# Patient Record
Sex: Female | Born: 1950 | Race: Black or African American | Hispanic: No | Smoking: Never smoker
Health system: Southern US, Community
[De-identification: ages and names within clinical notes are randomized; demographics above are authoritative.]

## PROBLEM LIST (undated history)

## (undated) DIAGNOSIS — I10 Essential (primary) hypertension: Secondary | ICD-10-CM

## (undated) DIAGNOSIS — R7303 Prediabetes: Secondary | ICD-10-CM

## (undated) DIAGNOSIS — E785 Hyperlipidemia, unspecified: Secondary | ICD-10-CM

## (undated) DIAGNOSIS — E559 Vitamin D deficiency, unspecified: Secondary | ICD-10-CM

## (undated) HISTORY — PX: ABDOMINAL HYSTERECTOMY: SHX81

## (undated) HISTORY — DX: Prediabetes: R73.03

## (undated) HISTORY — DX: Vitamin D deficiency, unspecified: E55.9

## (undated) HISTORY — DX: Hyperlipidemia, unspecified: E78.5

## (undated) HISTORY — DX: Essential (primary) hypertension: I10

---

## 1998-11-03 ENCOUNTER — Encounter: Payer: Self-pay | Admitting: Internal Medicine

## 1998-11-03 ENCOUNTER — Ambulatory Visit (HOSPITAL_COMMUNITY): Admission: RE | Admit: 1998-11-03 | Discharge: 1998-11-03 | Payer: Self-pay | Admitting: Internal Medicine

## 1999-11-24 ENCOUNTER — Encounter: Payer: Self-pay | Admitting: Internal Medicine

## 1999-11-24 ENCOUNTER — Ambulatory Visit (HOSPITAL_COMMUNITY): Admission: RE | Admit: 1999-11-24 | Discharge: 1999-11-24 | Payer: Self-pay | Admitting: Internal Medicine

## 2000-02-03 ENCOUNTER — Emergency Department (HOSPITAL_COMMUNITY): Admission: EM | Admit: 2000-02-03 | Discharge: 2000-02-03 | Payer: Self-pay | Admitting: *Deleted

## 2000-11-25 ENCOUNTER — Ambulatory Visit (HOSPITAL_COMMUNITY): Admission: RE | Admit: 2000-11-25 | Discharge: 2000-11-25 | Payer: Self-pay | Admitting: Internal Medicine

## 2000-11-25 ENCOUNTER — Encounter: Payer: Self-pay | Admitting: Internal Medicine

## 2001-09-08 ENCOUNTER — Encounter: Payer: Self-pay | Admitting: Internal Medicine

## 2001-09-08 ENCOUNTER — Encounter: Admission: RE | Admit: 2001-09-08 | Discharge: 2001-09-08 | Payer: Self-pay | Admitting: Internal Medicine

## 2001-09-11 ENCOUNTER — Inpatient Hospital Stay (HOSPITAL_COMMUNITY): Admission: EM | Admit: 2001-09-11 | Discharge: 2001-09-20 | Payer: Self-pay | Admitting: Emergency Medicine

## 2001-09-11 ENCOUNTER — Encounter (INDEPENDENT_AMBULATORY_CARE_PROVIDER_SITE_OTHER): Payer: Self-pay

## 2001-09-11 ENCOUNTER — Encounter: Payer: Self-pay | Admitting: Internal Medicine

## 2001-09-12 ENCOUNTER — Encounter: Payer: Self-pay | Admitting: Internal Medicine

## 2001-10-24 ENCOUNTER — Other Ambulatory Visit: Admission: RE | Admit: 2001-10-24 | Discharge: 2001-10-24 | Payer: Self-pay | Admitting: Internal Medicine

## 2001-11-27 ENCOUNTER — Encounter: Payer: Self-pay | Admitting: Internal Medicine

## 2001-11-27 ENCOUNTER — Ambulatory Visit (HOSPITAL_COMMUNITY): Admission: RE | Admit: 2001-11-27 | Discharge: 2001-11-27 | Payer: Self-pay | Admitting: Internal Medicine

## 2002-11-28 ENCOUNTER — Ambulatory Visit (HOSPITAL_COMMUNITY): Admission: RE | Admit: 2002-11-28 | Discharge: 2002-11-28 | Payer: Self-pay | Admitting: Internal Medicine

## 2002-11-28 ENCOUNTER — Encounter: Payer: Self-pay | Admitting: Internal Medicine

## 2003-12-05 ENCOUNTER — Ambulatory Visit (HOSPITAL_COMMUNITY): Admission: RE | Admit: 2003-12-05 | Discharge: 2003-12-05 | Payer: Self-pay | Admitting: Internal Medicine

## 2004-12-18 ENCOUNTER — Ambulatory Visit (HOSPITAL_COMMUNITY): Admission: RE | Admit: 2004-12-18 | Discharge: 2004-12-18 | Payer: Self-pay | Admitting: Internal Medicine

## 2005-01-26 ENCOUNTER — Ambulatory Visit: Payer: Self-pay | Admitting: Internal Medicine

## 2005-04-30 ENCOUNTER — Ambulatory Visit: Payer: Self-pay | Admitting: Internal Medicine

## 2005-08-30 ENCOUNTER — Ambulatory Visit: Payer: Self-pay | Admitting: Internal Medicine

## 2005-09-06 ENCOUNTER — Ambulatory Visit: Payer: Self-pay | Admitting: Internal Medicine

## 2005-12-03 ENCOUNTER — Emergency Department (HOSPITAL_COMMUNITY): Admission: EM | Admit: 2005-12-03 | Discharge: 2005-12-03 | Payer: Self-pay | Admitting: Emergency Medicine

## 2005-12-06 ENCOUNTER — Ambulatory Visit: Payer: Self-pay | Admitting: Internal Medicine

## 2005-12-13 ENCOUNTER — Ambulatory Visit: Payer: Self-pay | Admitting: Internal Medicine

## 2006-01-06 ENCOUNTER — Ambulatory Visit (HOSPITAL_COMMUNITY): Admission: RE | Admit: 2006-01-06 | Discharge: 2006-01-06 | Payer: Self-pay | Admitting: Internal Medicine

## 2006-01-26 ENCOUNTER — Encounter: Admission: RE | Admit: 2006-01-26 | Discharge: 2006-01-26 | Payer: Self-pay | Admitting: Internal Medicine

## 2006-01-26 ENCOUNTER — Ambulatory Visit: Payer: Self-pay | Admitting: Internal Medicine

## 2006-12-16 ENCOUNTER — Ambulatory Visit: Payer: Self-pay | Admitting: Internal Medicine

## 2006-12-16 LAB — CONVERTED CEMR LAB
ALT: 20 units/L (ref 0–40)
AST: 25 units/L (ref 0–37)
Albumin: 4 g/dL (ref 3.5–5.2)
Alkaline Phosphatase: 61 units/L (ref 39–117)
BUN: 5 mg/dL — ABNORMAL LOW (ref 6–23)
Bilirubin, Direct: 0.1 mg/dL (ref 0.0–0.3)
CO2: 30 meq/L (ref 19–32)
Calcium: 9.6 mg/dL (ref 8.4–10.5)
Chloride: 108 meq/L (ref 96–112)
Cholesterol: 189 mg/dL (ref 0–200)
Creatinine, Ser: 1 mg/dL (ref 0.4–1.2)
GFR calc Af Amer: 74 mL/min
GFR calc non Af Amer: 61 mL/min
Glucose, Bld: 100 mg/dL — ABNORMAL HIGH (ref 70–99)
HDL: 34.6 mg/dL — ABNORMAL LOW (ref >39.0)
LDL Cholesterol: 142 mg/dL — ABNORMAL HIGH (ref 0–99)
Potassium: 5.2 meq/L — ABNORMAL HIGH (ref 3.5–5.1)
Sodium: 143 meq/L (ref 135–145)
Total Bilirubin: 0.8 mg/dL (ref 0.3–1.2)
Total CHOL/HDL Ratio: 5.5
Total Protein: 7 g/dL (ref 6.0–8.3)
Triglycerides: 63 mg/dL (ref 0–149)
VLDL: 13 mg/dL (ref 0–40)

## 2007-01-10 ENCOUNTER — Ambulatory Visit (HOSPITAL_COMMUNITY): Admission: RE | Admit: 2007-01-10 | Discharge: 2007-01-10 | Payer: Self-pay | Admitting: Internal Medicine

## 2007-07-11 DIAGNOSIS — Z8719 Personal history of other diseases of the digestive system: Secondary | ICD-10-CM

## 2007-07-12 ENCOUNTER — Ambulatory Visit: Payer: Self-pay | Admitting: Internal Medicine

## 2007-07-12 DIAGNOSIS — N040 Nephrotic syndrome with minor glomerular abnormality: Secondary | ICD-10-CM

## 2007-07-12 HISTORY — DX: Nephrotic syndrome with minor glomerular abnormality: N04.0

## 2007-07-12 LAB — CONVERTED CEMR LAB
Bilirubin Urine: NEGATIVE
Blood in Urine, dipstick: NEGATIVE
Glucose, Urine, Semiquant: NEGATIVE
Ketones, urine, test strip: NEGATIVE
Nitrite: NEGATIVE
Protein, U semiquant: NEGATIVE
Specific Gravity, Urine: 1.015
Urobilinogen, UA: NEGATIVE
pH: 6

## 2007-07-13 LAB — CONVERTED CEMR LAB
ALT: 17 units/L (ref 0–35)
AST: 20 units/L (ref 0–37)
Albumin: 4 g/dL (ref 3.5–5.2)
Alkaline Phosphatase: 65 units/L (ref 39–117)
BUN: 9 mg/dL (ref 6–23)
Basophils Absolute: 0 10*3/uL (ref 0.0–0.1)
Basophils Relative: 0.3 % (ref 0.0–1.0)
Bilirubin, Direct: 0.1 mg/dL (ref 0.0–0.3)
CO2: 31 meq/L (ref 19–32)
Calcium: 9.5 mg/dL (ref 8.4–10.5)
Chloride: 109 meq/L (ref 96–112)
Creatinine, Ser: 1 mg/dL (ref 0.4–1.2)
Eosinophils Absolute: 0.1 10*3/uL (ref 0.0–0.6)
Eosinophils Relative: 1.6 % (ref 0.0–5.0)
GFR calc Af Amer: 73 mL/min
GFR calc non Af Amer: 61 mL/min
Glucose, Bld: 96 mg/dL (ref 70–99)
HCT: 38.5 % (ref 36.0–46.0)
Hemoglobin: 13.1 g/dL (ref 12.0–15.0)
Lymphocytes Relative: 40 % (ref 12.0–46.0)
MCHC: 34 g/dL (ref 30.0–36.0)
MCV: 82.5 fL (ref 78.0–100.0)
Monocytes Absolute: 0.9 10*3/uL — ABNORMAL HIGH (ref 0.2–0.7)
Monocytes Relative: 10.2 % (ref 3.0–11.0)
Neutro Abs: 4 10*3/uL (ref 1.4–7.7)
Neutrophils Relative %: 47.9 % (ref 43.0–77.0)
Platelets: 254 10*3/uL (ref 150–400)
Potassium: 5.4 meq/L — ABNORMAL HIGH (ref 3.5–5.1)
RBC: 4.66 M/uL (ref 3.87–5.11)
RDW: 12.9 % (ref 11.5–14.6)
Sodium: 145 meq/L (ref 135–145)
Total Bilirubin: 0.7 mg/dL (ref 0.3–1.2)
Total Protein: 7.1 g/dL (ref 6.0–8.3)
WBC: 8.4 10*3/uL (ref 4.5–10.5)

## 2007-07-19 ENCOUNTER — Ambulatory Visit: Payer: Self-pay | Admitting: Internal Medicine

## 2007-07-19 LAB — CONVERTED CEMR LAB
BUN: 7 mg/dL (ref 6–23)
CO2: 30 meq/L (ref 19–32)
Calcium: 9.5 mg/dL (ref 8.4–10.5)
Chloride: 107 meq/L (ref 96–112)
Creatinine, Ser: 0.9 mg/dL (ref 0.4–1.2)
GFR calc Af Amer: 83 mL/min
GFR calc non Af Amer: 69 mL/min
Glucose, Bld: 98 mg/dL (ref 70–99)
Potassium: 4.5 meq/L (ref 3.5–5.1)
Sodium: 141 meq/L (ref 135–145)

## 2007-12-15 ENCOUNTER — Ambulatory Visit: Payer: Self-pay | Admitting: Internal Medicine

## 2007-12-18 LAB — CONVERTED CEMR LAB
AST: 21 units/L (ref 0–37)
Albumin: 3.8 g/dL (ref 3.5–5.2)
CO2: 29 meq/L (ref 19–32)
Chloride: 105 meq/L (ref 96–112)
Cholesterol: 171 mg/dL (ref 0–200)
Creatinine, Ser: 1 mg/dL (ref 0.4–1.2)
HDL: 32.3 mg/dL — ABNORMAL LOW (ref >39.0)
Sodium: 141 meq/L (ref 135–145)
Total Bilirubin: 0.7 mg/dL (ref 0.3–1.2)
Total Protein: 6.9 g/dL (ref 6.0–8.3)
VLDL: 13 mg/dL (ref 0–40)

## 2008-05-15 ENCOUNTER — Ambulatory Visit (HOSPITAL_COMMUNITY): Admission: RE | Admit: 2008-05-15 | Discharge: 2008-05-15 | Payer: Self-pay | Admitting: Internal Medicine

## 2009-05-27 ENCOUNTER — Ambulatory Visit (HOSPITAL_COMMUNITY): Admission: RE | Admit: 2009-05-27 | Discharge: 2009-05-27 | Payer: Self-pay | Admitting: Internal Medicine

## 2009-07-18 ENCOUNTER — Ambulatory Visit: Payer: Self-pay | Admitting: Internal Medicine

## 2009-07-21 LAB — CONVERTED CEMR LAB
ALT: 17 units/L (ref 0–35)
AST: 23 units/L (ref 0–37)
Alkaline Phosphatase: 63 units/L (ref 39–117)
Basophils Absolute: 0.1 10*3/uL (ref 0.0–0.1)
Calcium: 8.8 mg/dL (ref 8.4–10.5)
Cholesterol: 211 mg/dL — ABNORMAL HIGH (ref 0–200)
Direct LDL: 160.1 mg/dL
Eosinophils Absolute: 0.1 10*3/uL (ref 0.0–0.7)
GFR calc non Af Amer: 82.38 mL/min (ref >60)
HCT: 40.7 % (ref 36.0–46.0)
Lymphs Abs: 2.6 10*3/uL (ref 0.7–4.0)
MCV: 84.5 fL (ref 78.0–100.0)
Monocytes Absolute: 0.7 10*3/uL (ref 0.1–1.0)
Platelets: 206 10*3/uL (ref 150.0–400.0)
RDW: 13.3 % (ref 11.5–14.6)
Sodium: 144 meq/L (ref 135–145)
TSH: 1.71 microintl units/mL (ref 0.35–5.50)
Total Bilirubin: 0.9 mg/dL (ref 0.3–1.2)
Total CHOL/HDL Ratio: 6

## 2009-10-27 ENCOUNTER — Emergency Department (HOSPITAL_COMMUNITY): Admission: EM | Admit: 2009-10-27 | Discharge: 2009-10-27 | Payer: Self-pay | Admitting: Emergency Medicine

## 2009-10-27 ENCOUNTER — Inpatient Hospital Stay (HOSPITAL_COMMUNITY): Admission: EM | Admit: 2009-10-27 | Discharge: 2009-10-30 | Payer: Self-pay | Admitting: Emergency Medicine

## 2010-06-03 ENCOUNTER — Encounter (INDEPENDENT_AMBULATORY_CARE_PROVIDER_SITE_OTHER): Payer: Self-pay | Admitting: *Deleted

## 2010-06-24 ENCOUNTER — Ambulatory Visit (HOSPITAL_COMMUNITY): Admission: RE | Admit: 2010-06-24 | Discharge: 2010-06-24 | Payer: Self-pay | Admitting: Internal Medicine

## 2010-12-20 ENCOUNTER — Encounter: Payer: Self-pay | Admitting: Internal Medicine

## 2010-12-31 NOTE — Letter (Signed)
Summary: Colonoscopy Letter  [facility]  [address] [place], [place] [address]   Phone: [phone]  Fax: [phone]      June 03, 2010 MRN: [id number]   CHARDAI GANGEMI [address] [place], [place]  [address]   Dear Ms. Ronne Binning,   According to your medical record, it is time for you to schedule a Colonoscopy. The American Cancer Society recommends this procedure as a method to detect early colon cancer. Patients with a family history of colon cancer, or a personal history of colon polyps or inflammatory bowel disease are at increased risk.  This letter has beeen generated based on the recommendations made at the time of your procedure. If you feel that in your particular situation this may no longer apply, please contact our office.  Please call our office at [phone] to schedule this appointment or to update your records at your earliest convenience.  Thank you for cooperating with [place] to provide you with the very best care possible.   Sincerely,  Hedwig Morton. Juanda Chance, M.D.  [facility] [phone]

## 2011-01-02 ENCOUNTER — Emergency Department (HOSPITAL_COMMUNITY)
Admission: EM | Admit: 2011-01-02 | Discharge: 2011-01-02 | Disposition: A | Discharge status: Home / Self Care | Payer: Self-pay | Attending: Emergency Medicine | Admitting: Emergency Medicine

## 2011-01-02 ENCOUNTER — Emergency Department (HOSPITAL_COMMUNITY): Payer: Self-pay

## 2011-01-02 DIAGNOSIS — R109 Unspecified abdominal pain: Secondary | ICD-10-CM | POA: Insufficient documentation

## 2011-01-02 DIAGNOSIS — K5900 Constipation, unspecified: Secondary | ICD-10-CM | POA: Insufficient documentation

## 2011-01-02 DIAGNOSIS — K644 Residual hemorrhoidal skin tags: Secondary | ICD-10-CM | POA: Insufficient documentation

## 2011-01-02 DIAGNOSIS — I10 Essential (primary) hypertension: Secondary | ICD-10-CM | POA: Insufficient documentation

## 2011-03-02 LAB — CBC
HCT: 30 % — ABNORMAL LOW (ref 36.0–46.0)
Hemoglobin: 9.9 g/dL — ABNORMAL LOW (ref 12.0–15.0)
MCHC: 33 g/dL (ref 30.0–36.0)
MCHC: 33.6 g/dL (ref 30.0–36.0)
MCV: 84.4 fL (ref 78.0–100.0)
RBC: 3.49 MIL/uL — ABNORMAL LOW (ref 3.87–5.11)
RDW: 13.8 % (ref 11.5–15.5)
WBC: 10.7 10*3/uL — ABNORMAL HIGH (ref 4.0–10.5)

## 2011-03-02 LAB — BASIC METABOLIC PANEL
CO2: 23 mEq/L (ref 19–32)
CO2: 24 mEq/L (ref 19–32)
Calcium: 8.1 mg/dL — ABNORMAL LOW (ref 8.4–10.5)
Calcium: 8.3 mg/dL — ABNORMAL LOW (ref 8.4–10.5)
Chloride: 109 mEq/L (ref 96–112)
Creatinine, Ser: 0.81 mg/dL (ref 0.4–1.2)
GFR calc Af Amer: >60 mL/min (ref >60)
Glucose, Bld: 112 mg/dL — ABNORMAL HIGH (ref 70–99)
Potassium: 3.6 mEq/L (ref 3.5–5.1)
Sodium: 140 mEq/L (ref 135–145)

## 2011-03-02 LAB — CULTURE, BLOOD (ROUTINE X 2): Culture: NO GROWTH

## 2011-03-02 LAB — DIFFERENTIAL
Basophils Relative: 1 % (ref 0–1)
Monocytes Relative: 13 % — ABNORMAL HIGH (ref 3–12)
Neutro Abs: 7.1 10*3/uL (ref 1.7–7.7)
Neutrophils Relative %: 67 % (ref 43–77)

## 2011-03-03 LAB — CULTURE, BLOOD (ROUTINE X 2): Culture: NO GROWTH

## 2011-03-03 LAB — DIFFERENTIAL
Basophils Relative: 0 % (ref 0–1)
Basophils Relative: 0 % (ref 0–1)
Lymphocytes Relative: 8 % — ABNORMAL LOW (ref 12–46)
Monocytes Absolute: 1.7 10*3/uL — ABNORMAL HIGH (ref 0.1–1.0)
Monocytes Relative: 8 % (ref 3–12)
Monocytes Relative: 9 % (ref 3–12)
Neutro Abs: 16 10*3/uL — ABNORMAL HIGH (ref 1.7–7.7)
Neutro Abs: 21 10*3/uL — ABNORMAL HIGH (ref 1.7–7.7)

## 2011-03-03 LAB — POCT URINALYSIS DIP (DEVICE)
Nitrite: POSITIVE — AB
Protein, ur: 100 mg/dL — AB
pH: 5 (ref 5.0–8.0)

## 2011-03-03 LAB — CBC
HCT: 31 % — ABNORMAL LOW (ref 36.0–46.0)
HCT: 34.2 % — ABNORMAL LOW (ref 36.0–46.0)
MCHC: 32.8 g/dL (ref 30.0–36.0)
MCHC: 33.3 g/dL (ref 30.0–36.0)
MCV: 84.6 fL (ref 78.0–100.0)
Platelets: 349 10*3/uL (ref 150–400)
Platelets: 371 10*3/uL (ref 150–400)
Platelets: 410 10*3/uL — ABNORMAL HIGH (ref 150–400)
RDW: 13.6 % (ref 11.5–15.5)
RDW: 13.8 % (ref 11.5–15.5)
RDW: 13.9 % (ref 11.5–15.5)

## 2011-03-03 LAB — COMPREHENSIVE METABOLIC PANEL
Albumin: 2.8 g/dL — ABNORMAL LOW (ref 3.5–5.2)
Alkaline Phosphatase: 65 U/L (ref 39–117)
BUN: 8 mg/dL (ref 6–23)
Potassium: 3.7 mEq/L (ref 3.5–5.1)
Sodium: 137 mEq/L (ref 135–145)
Total Protein: 7.1 g/dL (ref 6.0–8.3)

## 2011-03-03 LAB — BASIC METABOLIC PANEL
BUN: 5 mg/dL — ABNORMAL LOW (ref 6–23)
CO2: 23 mEq/L (ref 19–32)
Chloride: 109 mEq/L (ref 96–112)
Glucose, Bld: 128 mg/dL — ABNORMAL HIGH (ref 70–99)
Potassium: 3.6 mEq/L (ref 3.5–5.1)

## 2011-03-03 LAB — URINE CULTURE

## 2011-03-03 LAB — POCT I-STAT, CHEM 8
BUN: 7 mg/dL (ref 6–23)
Calcium, Ion: 0.99 mmol/L — ABNORMAL LOW (ref 1.12–1.32)
Chloride: 104 mEq/L (ref 96–112)
Glucose, Bld: 132 mg/dL — ABNORMAL HIGH (ref 70–99)

## 2011-04-16 NOTE — H&P (Signed)
[place]. [facility]  Patient:    SERIYAH, COLLISON Visit Number: [id number] MRN: [id number]          Service Type: MED Location: 1800 [id number] [id number] Attending Physician:  Doug Sou Dictated by:   [name], M.D. [facility] Admit Date:  09/11/2001   CC:         Bruce H. Swords, M.D. [facility]  at [facility], [place]. [name], M.D.   History and Physical  CHIEF COMPLAINT:  Acute renal failure.  HISTORY OF PRESENT ILLNESS:  Mrs. Vosler is a 60 year old married black female, mother of one, previously healthy, with no prior history of renal disease. The patient reports a two-week history of nausea, vomiting, diarrhea, abdominal pain, fevers, rigors.  She has had no chest pain.  She has had no neurologic symptoms.  She has had no skin changes.  She has had no significant weight loss.  No night sweats.  No noticeable adenopathy.  As an outpatient, patient was evaluated by Dr. Valetta Mole. Swords on multiple occasions for abdominal pain.  Laboratory included rising amylase, normal LFTs.  Outpatient ultrasound revealed gallbladder polyps.  No evidence of cholelithiasis or dilated common bile duct but a question of hypoechoic kidneys with no hydronephrosis.  Laboratory on the day of admission as an outpatient revealed a BUN of 103, creatinine of 13.6.  The patient is now admitted with acute renal failure.  PAST SURGICAL HISTORY:  TAH in the 19s secondary to fibroids.  PAST MEDICAL HISTORY:  Usual childhood diseases.  She is a gravida 1, para 1. She has had no major medical illnesses.  CURRENT MEDICATIONS:  Phenergan suppositories p.r.n.  The patient takes Marlin Canary powder two to three times a week.  HABITS:  Tobacco:  None.  Alcohol:  None.  ALLERGIES:  She has no known drug allergies.  FAMILY HISTORY:  Negative for kidney disease.  Negative for CAD.  One sister recently diagnosed with breast cancer.  No diabetes.  Positive  for hypertension.  SOCIAL HISTORY:  The patient works as an Higher education careers adviser at [employer].  She has been married for six years to her second husband.  She was single for almost 25 years and married for three years to her first husband.  She has one son age 8.  The patient has a brother recently shot to death.  REVIEW OF SYSTEMS:  Negative for any cardiac, pulmonary, GI, or GYN disease. She does see her gynecologist on a regular basis and has had recent breast exam and pelvic exam.  PHYSICAL EXAMINATION:  VITAL SIGNS:  Temperature 98.6, blood pressure 167/90, heart rate 73, respirations 18.  GENERAL:  A heavy-set black female in no acute distress but she is cold.  HEENT:  Normocephalic and atraumatic.  Conjunctivae and sclerae were clear. PERRLA.  EOMI.  The patient had no oral lesions.  NECK:  Supple.  There was no thyromegaly.  LYMPH NODES:  No adenopathy is noted in the supraclavicular, cervical, or inguinal regions.  CHEST:  The patient has mild CVA tenderness.  LUNGS:  Clear to auscultation and percussion without rales, wheezes, or rhonchi.  CARDIOVASCULAR:  Peripheral pulses 2+ throughout.  She had a quiet precordium. Her heart rate was regular without murmurs, rubs, or gallops.  BREASTS:  Deferred to recent GYN exam.  ABDOMEN:  The patient had positive bowel sounds.  She had diffuse tenderness, worse at the level of the umbilicus bilaterally.  She had suprapubic tenderness  as well.  She had no guarding or rebound.  PELVIC:  Deferred.  EXTREMITIES:  Without clubbing, cyanosis, or edema.  NEUROLOGICAL:  Nonfocal.  SKIN:  Clear with no uremic frost or other signs of renal disease.  She had no rash across her face to suggest lupus.  LABORATORY DATA:  Abdominal ultrasound from September 08, 2001 with gallbladder polyps and hypoechoic kidneys without hydronephrosis.  Laboratory on September 11, 2001 with BUN 103, creatinine 13.6.  Amylase  from August 29, 2001 of 139, from September 05, 2001 of 236.  LFTs from September 05, 2001 were normal.  CBC from September 05, 2001 with a hemoglobin 11.9, hematocrit 36.3, white count was 9900, platelet count 471,000.  UA in the emergency room with a specific gravity of 1.021, pH of 6, 3 to 6 wbcs, 7 to 10 rbcs, and granular cast.  A 12-lead electrocardiogram revealed normal sinus rhythm, question of prolonged QT interval.  No increased T-waves.  No evidence of any old injury or acute injury.  ASSESSMENT: Acute renal failure:  The patient was previously healthy.  She has had no sweats and no weight loss.  She has had increased urinary frequency. Need to rule out multiple myeloma.  We need to rule out collagen vascular disease.  Need to consider possible acute nephritis.  PLAN:  Laboratory to include stat BMET, 12-lead already done, CBC with differential stat.  Will check a urine and serum protein electrophoresis. Will repeat LFTs.  Will check a 24-hour for protein and a creatinine clearance.  We will check ANA and ESR.  Renal consult for possible acute dialysis. Dictated by:   [name], M.D. [facility] Attending Physician:  Doug Sou DD:  09/11/01 TD:  09/11/01 Job: [id number]

## 2011-04-16 NOTE — Discharge Summary (Signed)
[facility]  Patient:    Lydia Edwards, Lydia Edwards Visit Number: [id number] MRN: [id number]          Service Type: MED Location: 5500 [id number] Attending Physician:  Lydia Edwards Dictated by:   Lydia Edwards, P.A.C. Admit Date:  09/11/2001 Discharge Date: 09/20/2001   CC:         Lydia Edwards, M.D. [facility]   Discharge Summary  DATE OF BIRTH:  November 13, 1951.  ADMITTING PHYSICIAN:  Lydia Edwards, M.D. [place]  DISCHARGE PHYSICIAN:  Dr. Marina Edwards.  DISCHARGE DIAGNOSES: 1. 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 2. Hypertension. 3. Metabolic acidosis. 4. Fever.  CULTURE:  Negative.  CONSULTATIONS:  Dr. Casimiro Edwards.  HISTORY OF PRESENT ILLNESS:  The patient is a 60 year old married black female mother of 1 previously healthy with no prior history of renal disease.  The patient reports a 2 week history of nausea, vomiting, diarrhea, abdominal pain, fever, and rigors.  She has no chest pain, no neurologic symptoms, and no skin changes.  She has had no significant weight loss, no night sweats, no noticeable adenopathy.  As an outpatient the patient had been evaluated by Dr. Birdie Edwards on multiple occasions for abdominal pain.  LABORATORY DATA:  Includes rising amylase, normal LFTs.  Outpatient ultrasound revealed gallbladder polyps, no evidence of cholelithiasis or dilated common bile duct but a question of hypoechogenic kidneys without hydronephrosis.  LABORATORY DATA:  On the day of admission revealed a BUN of 103, creatinine 13.6.  The patient was admitted with acute renal failure by Dr. Debby Edwards for renal consultation.  HOSPITAL COURSE:  The patient was admitted to the renal unit. Dr. Lowell Edwards saw the patient in consultation for her acute renal failure.  Renal ultrasound on September 08, 2001, showed bilateral echogenic kidneys, right 14.6 cm, and left 16.2 cm., amylase was 236, LFTs okay, albumin 2.5, patient reports  ingesting Goody Powders and Aleve p.r.n. headaches up to 2 to 3 times a week.  Recently she has noticed a slight decrease in her urine output and no other significant changes.  The patient had an in depth work-up which included laboratory data that showed 37 grams protein on 2.7 liters of fluid on her first 24 hour collection.  Negative ANA, negative anti-double stranded DNA, negative ASO, negative ANCA, and negative anti-glomerular basement membrane antibody.  ESR was 110.  Sickle cell was negative.  Sodium bicarbonate was added to correct her acidosis and she was gently hydrated.  Bleeding time was 10.5 for which she received a dose of DDAVP prior to renal biopsy.  This was done by Lydia Edwards on September 13, 2001, and was not available at the time of discharge but has since come back and showed minimal change glomerulopathy with acute renal failure.  The day prior to discharge the patient had another 24 hour urine.  AT that time she had 23 grams of protein and 3.5 liters of urine with a creatinine of 3.1.  Her albumin was down to 1.4.  She received several doses of Lydia Edwards for intermittent fever.  Urine cultures were negative.  Renal function gradually improved during her hospitalization and the patient did not require any dialysis.  AT the time of discharge her creatinine had returned to 2.3, with a BUN of 18, calcium 8.1, CO2 was 22, potassium was 3.9.  She required some supplemental potassium and magnesium oxide and was doing well at discharge and only needed to return home on  her blood pressure medicine of Cardizem which was at a reduced dose 180 mg per day.  Blood pressure and weight at the time of discharge were 126/79 with a weight of 76.6 kilograms.  Condition improved.  DISCHARGE INSTRUCTIONS:  Cardizem 180 mg per day. The patient was instructed not to take aspirin, ibuprofen, Motrin, aleve, BC or goody powders.  It was okay to take Tylenol for pain.  DISCHARGE DIET:   No added salt.  FOLLOWUP: The patient is to follow up with Dr. Debby Edwards.  She also has follow up lab work with [facility] on Tuesday, October 17, 2001, including a CBC, renal panel and another 24 hour urine.  She will then see Dr. Caryn Edwards, October 20, 2001, in the office at 8:30 a.m. Dictated by:   Lydia Edwards, P.A.C. Attending Physician:  Lydia Edwards DD:  10/10/01 TD:  10/10/01 Job: [id number]

## 2011-04-16 NOTE — Consult Note (Signed)
[place]. [facility]  Patient:    Lydia Edwards, Lydia Edwards Visit Number: [id number] MRN: [id number]          Service Type: MED Location: [id number] Attending Physician:  Rosezetta Schlatter Dictated by:   Darrold Span Florene Glen, M.D. Proc. Date: 09/11/01 Admit Date:  09/11/2001   CC:         Heinz Knuckles. Norins, M.D. [facility]   Consultation Report  HISTORY OF PRESENT ILLNESS: The patient is a 60 year old female with essentially a remarkable past medical history who presents with a two-week history with the onset two weeks ago of nausea and vomiting and abdominal pain, and had ongoing symptomatology and on screening laboratory evaluation was found to have a BUN of 103 and serum creaT of 13.6 mg/dl this afternoon. Renal ultrasound done on September 08, 2001, revealed evidence of gallbladder polyps and bilateral enlarged echogenic kidneys with the right kidney at 14.6 cm and the left kidney of 16.2 cm.  Amylase on October 8 was 236 international units per liter.  Liver function studies were within normal limits and serum albumin was low at 2.5 g/dl.  She reports the ingestion of Goodys powder and Aleve as needed for headache up to 2-3 times per week but no more frequent than that. She reports slightly decreased urine output over the past several weeks. She denies foaminess of urine.  She does report nocturia x2 but this has not changed.  There are no arthralgias or mouth ulcerations.  No skin rashes.  No change in color of her urine.  PAST MEDICAL HISTORY: Status post hysterectomy, history of Bells palsy, status post one spontaneous vaginal delivery of a healthy boy.  SOCIAL HISTORY: She is married. She has one son who is alive and well. 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  FAMILY HISTORY: Remarkable for hypertension, but negative for kidney disease.  REVIEW OF SYSTEMS: Essentially noncontributory and as above.  PHYSICAL  EXAMINATION:  GENERAL: She is a well appearing, African-American female.  VITAL SIGNS: Blood pressure is 167/90.  HEENT: Atraumatic, normocephalic.  No carotid bruits. Neck veins not distended. There is no thyromegaly.  Fundi were not examined.  LUNGS: Clear to auscultation.  HEART: Regular rate and rhythm. No pericardial friction rub.  ABDOMEN: Soft. There are no masses. No organomegaly. There is no CVA tenderness.  EXTREMITIES: Pretibial edema of 1+, pitting bilaterally. There is trace presacral edema.  NEUROLOGICAL: No focality. There is no asterixis.  LABORATORY STUDIES: Sodium 135, potassium 4.4, chloride 106, CO2 19, BUN 93, creatinine 12.7. Hemoglobin 11.9, platelet count of 451,000. Urinalysis greater than 300 mg of protein, 3-6 white blood cells, 7-10 red blood cells.  ASSESSMENT: Renal failure, probably acute on the basis of glomerulonephritis (likely acute).  RECOMMENDATIONS: 1. Renal biopsy. 2. Add bicarbonate therapy and phosphate binder. 3. ANCA and anti-GBM levels. 4. Dilating intervention as needed. 5. Bleeding time.  Thanks for letting us see this patient. I have discussed with the patient, the patients husband, and the patients sister the implications of this disease. Dictated by:   Darrold Span Florene Glen, M.D. Attending Physician:  Rosezetta Schlatter DD:  09/11/01 TD:  09/12/01 Job: [id number] [id number]

## 2011-08-23 ENCOUNTER — Other Ambulatory Visit (HOSPITAL_COMMUNITY): Payer: Self-pay | Admitting: Internal Medicine

## 2011-08-23 DIAGNOSIS — Z1231 Encounter for screening mammogram for malignant neoplasm of breast: Secondary | ICD-10-CM

## 2011-08-27 ENCOUNTER — Ambulatory Visit (HOSPITAL_COMMUNITY)
Admission: RE | Admit: 2011-08-27 | Discharge: 2011-08-27 | Disposition: A | Discharge status: Home / Self Care | Payer: Self-pay | Source: Ambulatory Visit | Attending: Internal Medicine | Admitting: Internal Medicine

## 2011-08-27 DIAGNOSIS — Z1231 Encounter for screening mammogram for malignant neoplasm of breast: Secondary | ICD-10-CM

## 2011-10-05 ENCOUNTER — Other Ambulatory Visit (HOSPITAL_COMMUNITY): Payer: Self-pay | Admitting: Internal Medicine

## 2011-10-05 ENCOUNTER — Ambulatory Visit (HOSPITAL_COMMUNITY)
Admission: RE | Admit: 2011-10-05 | Discharge: 2011-10-05 | Disposition: A | Discharge status: Home / Self Care | Payer: Self-pay | Source: Ambulatory Visit | Attending: Internal Medicine | Admitting: Internal Medicine

## 2011-10-05 DIAGNOSIS — R059 Cough, unspecified: Secondary | ICD-10-CM | POA: Insufficient documentation

## 2011-10-05 DIAGNOSIS — R0989 Other specified symptoms and signs involving the circulatory and respiratory systems: Secondary | ICD-10-CM

## 2011-10-05 DIAGNOSIS — I10 Essential (primary) hypertension: Secondary | ICD-10-CM | POA: Insufficient documentation

## 2011-10-05 DIAGNOSIS — R062 Wheezing: Secondary | ICD-10-CM | POA: Insufficient documentation

## 2011-10-05 DIAGNOSIS — R05 Cough: Secondary | ICD-10-CM | POA: Insufficient documentation

## 2012-07-17 ENCOUNTER — Telehealth: Payer: Self-pay | Admitting: Internal Medicine

## 2012-07-17 ENCOUNTER — Encounter: Payer: Self-pay | Admitting: Internal Medicine

## 2012-07-17 NOTE — Telephone Encounter (Signed)
Recall Project: no contact with patient, letter mailed °

## 2012-08-28 ENCOUNTER — Other Ambulatory Visit (HOSPITAL_COMMUNITY): Payer: Self-pay | Admitting: Internal Medicine

## 2014-07-18 ENCOUNTER — Other Ambulatory Visit: Payer: Self-pay | Admitting: Internal Medicine

## 2014-08-06 ENCOUNTER — Other Ambulatory Visit: Payer: Self-pay | Admitting: Internal Medicine

## 2014-08-09 ENCOUNTER — Other Ambulatory Visit: Payer: Self-pay | Admitting: *Deleted

## 2014-08-09 MED ORDER — HYDROCHLOROTHIAZIDE 25 MG PO TABS: ORAL_TABLET | ORAL | Status: DC

## 2014-08-09 MED ORDER — BENAZEPRIL HCL 40 MG PO TABS: ORAL_TABLET | ORAL | Status: DC

## 2014-09-01 DIAGNOSIS — E559 Vitamin D deficiency, unspecified: Secondary | ICD-10-CM | POA: Insufficient documentation

## 2014-09-01 DIAGNOSIS — E119 Type 2 diabetes mellitus without complications: Secondary | ICD-10-CM | POA: Insufficient documentation

## 2014-09-01 DIAGNOSIS — E785 Hyperlipidemia, unspecified: Secondary | ICD-10-CM | POA: Insufficient documentation

## 2014-09-01 DIAGNOSIS — I10 Essential (primary) hypertension: Secondary | ICD-10-CM | POA: Insufficient documentation

## 2014-09-02 ENCOUNTER — Encounter: Payer: Self-pay | Admitting: Internal Medicine

## 2014-09-02 ENCOUNTER — Ambulatory Visit: Payer: Self-pay | Admitting: Internal Medicine

## 2014-09-02 VITALS — BP 142/86 | HR 50 | Temp 98.0°F | Resp 16 | Ht 64.0 in | Wt 188.0 lb

## 2014-09-02 DIAGNOSIS — E785 Hyperlipidemia, unspecified: Secondary | ICD-10-CM

## 2014-09-02 DIAGNOSIS — R7303 Prediabetes: Secondary | ICD-10-CM

## 2014-09-02 DIAGNOSIS — E559 Vitamin D deficiency, unspecified: Secondary | ICD-10-CM

## 2014-09-02 DIAGNOSIS — I10 Essential (primary) hypertension: Secondary | ICD-10-CM

## 2014-09-02 DIAGNOSIS — Z79899 Other long term (current) drug therapy: Secondary | ICD-10-CM | POA: Insufficient documentation

## 2014-09-02 NOTE — Progress Notes (Signed)
Patient ID: Lydia Edwards, female   DOB: [birth year], 63 y.o.   MRN: [id number]   This very nice 63 y.o.[name] presents for 3 month follow up with Hypertension, Hyperlipidemia, Pre-Diabetes and Vitamin D Deficiency. Patient has been lost to f/u since July 2014 due to lapse in Ins coverage with some type of problem she relates to [facility]. She anticipates having coverage when due for a 3 mo f/u.   Patient is treated for HTN & BP has been controlled at home. Today's BP: 142/86 mmHg. Patient has had no complaints of any cardiac type chest pain, palpitations, dyspnea/orthopnea/PND, dizziness, claudication, or dependent edema.  Hyperlipidemia is controlled with diet & meds. Patient denies myalgias or other med SE's. Last Lipids were TC 194, TG 104, HDL 38 & LDL 135 in July 2014 and patient was started on Atorvastatin.   Also, the patient has history of PreDiabetes with A1c 6.1% in July 2012 and has had no symptoms of reactive hypoglycemia, diabetic polys, paresthesias or visual blurring.  Last A1c was 5.7% in July 2014.    Further, the patient also has history of Vitamin D Deficiency (12 in July 2012) and supplements vitamin D sporadically without any suspected side-effects. Last vitamin D was  70 in July 2014.    Medication List   atorvastatin 80 MG tablet  Commonly known as:  LIPITOR  Take 80 mg by mouth daily.     benazepril 40 MG tablet  Commonly known as:  LOTENSIN  TAKE ONE TABLET BY MOUTH ONCE DAILY IN THE MORNING FOR BLOOD PRESSURE     hydrochlorothiazide 25 MG tablet  Commonly known as:  HYDRODIURIL  TAKE ONE TABLET BY MOUTH ONCE DAILY FOR BLOOD PRESSURE AND FLUID     Allergies  Allergen Reactions  . Naproxen     REACTION: renal failure  . Nsaids    PMHx:   Past Medical History  Diagnosis Date  . Hyperlipidemia   . Hypertension   . Vitamin D deficiency   . Prediabetes     FHx:    Reviewed / unchanged  SHx:    Reviewed / unchanged  Systems Review:  Constitutional: Denies  fever, chills, wt changes, headaches, insomnia, fatigue, night sweats, change in appetite. Eyes: Denies redness, blurred vision, diplopia, discharge, itchy, watery eyes.  ENT: Denies discharge, congestion, post nasal drip, epistaxis, sore throat, earache, hearing loss, dental pain, tinnitus, vertigo, sinus pain, snoring.  CV: Denies chest pain, palpitations, irregular heartbeat, syncope, dyspnea, diaphoresis, orthopnea, PND, claudication or edema. Respiratory: denies cough, dyspnea, DOE, pleurisy, hoarseness, laryngitis, wheezing.  Gastrointestinal: Denies dysphagia, odynophagia, heartburn, reflux, water brash, abdominal pain or cramps, nausea, vomiting, bloating, diarrhea, constipation, hematemesis, melena, hematochezia  or hemorrhoids. Genitourinary: Denies dysuria, frequency, urgency, nocturia, hesitancy, discharge, hematuria or flank pain. Musculoskeletal: Denies arthralgias, myalgias, stiffness, jt. swelling, pain, limping or strain/sprain.  Skin: Denies pruritus, rash, hives, warts, acne, eczema or change in skin lesion(s). Neuro: No weakness, tremor, incoordination, spasms, paresthesia or pain. Psychiatric: Denies confusion, memory loss or sensory loss. Endo: Denies change in weight, skin or hair change.  Heme/Lymph: No excessive bleeding, bruising or enlarged lymph nodes.  Exam:  BP 142/86  Pulse 50  Temp 98 F   Resp 16  Ht 5\' 4"    Wt 188 lb  BMI 32.25   Appears well nourished and in no distress. Eyes: PERRLA, EOMs, conjunctiva no swelling or erythema. Sinuses: No frontal/maxillary tenderness ENT/Mouth: EAC's clear, TM's nl w/o erythema, bulging. Nares clear w/o erythema, swelling, exudates. Oropharynx  clear without erythema or exudates. Oral hygiene is good. Tongue normal, non obstructing. Hearing intact.  Neck: Supple. Thyroid nl. Car 2+/2+ without bruits, nodes or JVD. Chest: Respirations nl with BS clear & equal w/o rales, rhonchi, wheezing or stridor.  Cor: Heart sounds  normal w/ regular rate and rhythm without sig. murmurs, gallops, clicks, or rubs. Peripheral pulses normal and equal  without edema.  Abdomen: Soft & bowel sounds normal. Non-tender w/o guarding, rebound, hernias, masses, or organomegaly.  Lymphatics: Unremarkable.  Musculoskeletal: Full ROM all peripheral extremities, joint stability, 5/5 strength, and normal gait.  Skin: Warm, dry without exposed rashes, lesions or ecchymosis apparent.  Neuro: Cranial nerves intact, reflexes equal bilaterally. Sensory-motor testing grossly intact. Tendon reflexes grossly intact.  Pysch: Alert & oriented x 3.  Insight and judgement nl & appropriate. No ideations.  Assessment and Plan:  1. Hypertension - Continue monitor blood pressure at home. Continue diet/meds same.  2. Hyperlipidemia - Continue diet/meds, exercise,& lifestyle modifications.   3. Pre-Diabetes - Continue diet, exercise, lifestyle modifications. .  4. Vitamin D Deficiency - Continue supplementation.   Recommended regular exercise, BP monitoring, weight control, and discussed med and SE's. Recommended labs to assess and monitor clinical status, but as patient is currently uninsured she prefers to defer labs until next OV.

## 2014-09-02 NOTE — Patient Instructions (Signed)

## 2014-09-11 ENCOUNTER — Other Ambulatory Visit: Payer: Self-pay | Admitting: *Deleted

## 2014-09-11 MED ORDER — BENAZEPRIL HCL 40 MG PO TABS: ORAL_TABLET | ORAL | Status: DC

## 2014-09-11 MED ORDER — ATORVASTATIN CALCIUM 80 MG PO TABS: 80.0000 mg | ORAL_TABLET | Freq: Every day | ORAL | Status: DC

## 2014-09-11 MED ORDER — HYDROCHLOROTHIAZIDE 25 MG PO TABS: ORAL_TABLET | ORAL | Status: DC

## 2014-12-09 ENCOUNTER — Ambulatory Visit: Payer: Self-pay | Admitting: Physician Assistant

## 2015-01-25 ENCOUNTER — Other Ambulatory Visit: Payer: Self-pay | Admitting: Internal Medicine

## 2015-01-27 ENCOUNTER — Other Ambulatory Visit: Payer: Self-pay | Admitting: Internal Medicine

## 2015-01-27 DIAGNOSIS — I10 Essential (primary) hypertension: Secondary | ICD-10-CM

## 2015-01-27 MED ORDER — BENAZEPRIL HCL 40 MG PO TABS: 40.0000 mg | ORAL_TABLET | Freq: Every day | ORAL | Status: DC

## 2015-01-27 MED ORDER — HYDROCHLOROTHIAZIDE 25 MG PO TABS: 25.0000 mg | ORAL_TABLET | Freq: Every day | ORAL | Status: DC

## 2015-02-03 ENCOUNTER — Encounter: Payer: Self-pay | Admitting: Internal Medicine

## 2015-02-03 ENCOUNTER — Ambulatory Visit (INDEPENDENT_AMBULATORY_CARE_PROVIDER_SITE_OTHER): Payer: No Typology Code available for payment source | Admitting: Internal Medicine

## 2015-02-03 VITALS — BP 112/78 | HR 56 | Temp 97.7°F | Resp 16 | Ht 64.0 in | Wt 190.0 lb

## 2015-02-03 DIAGNOSIS — E785 Hyperlipidemia, unspecified: Secondary | ICD-10-CM

## 2015-02-03 DIAGNOSIS — M25511 Pain in right shoulder: Secondary | ICD-10-CM

## 2015-02-03 DIAGNOSIS — E559 Vitamin D deficiency, unspecified: Secondary | ICD-10-CM

## 2015-02-03 DIAGNOSIS — R7309 Other abnormal glucose: Secondary | ICD-10-CM

## 2015-02-03 DIAGNOSIS — I10 Essential (primary) hypertension: Secondary | ICD-10-CM

## 2015-02-03 DIAGNOSIS — Z79899 Other long term (current) drug therapy: Secondary | ICD-10-CM

## 2015-02-03 DIAGNOSIS — R7303 Prediabetes: Secondary | ICD-10-CM

## 2015-02-03 MED ORDER — HYDROCHLOROTHIAZIDE 25 MG PO TABS: 25.0000 mg | ORAL_TABLET | Freq: Every day | ORAL | Status: DC

## 2015-02-03 MED ORDER — ATORVASTATIN CALCIUM 40 MG PO TABS: 40.0000 mg | ORAL_TABLET | Freq: Every day | ORAL | Status: DC

## 2015-02-03 MED ORDER — BENAZEPRIL HCL 40 MG PO TABS: 40.0000 mg | ORAL_TABLET | Freq: Every day | ORAL | Status: DC

## 2015-02-03 MED ORDER — PREDNISONE 20 MG PO TABS: ORAL_TABLET | ORAL | Status: DC

## 2015-02-03 NOTE — Progress Notes (Signed)
Patient ID: Lydia Edwards, female   DOB: [birth year], 64 y.o.   MRN: [id number]  Assessment and Plan:   1. Essential hypertension -Continue medication,  -monitor blood pressure at home.  -Continue DASH diet.   -Reminder to go to the ER if any CP, SOB, nausea, dizziness, severe HA, changes vision/speech, left arm numbness and tingling, and jaw pain. - TSH  2. Hyperlipidemia  - Lipid panel  3. Prediabetes  - Insulin, fasting - Hemoglobin A1c  4. Vitamin D deficiency  - Vit D  25 hydroxy (rtn osteoporosis monitoring)  5. Medication management  - CBC with Differential/Platelet - BASIC METABOLIC PANEL WITH GFR - Hepatic function panel - Magnesium  6. Right shoulder pain -try prednisone taper -if no relief referral to ortho  Continue diet and meds as discussed. Further disposition pending results of labs.  HPI 64 y.o. female  presents for 3 month follow up with hypertension, hyperlipidemia, prediabetes and vitamin D.   Her blood pressure has been controlled at home, today their BP is BP: 112/78 mmHg.   She does workout.  She walks up to two miles.  She denies chest pain, shortness of breath, dizziness.  Checks BP at home.  Average 128/70s.     She is on cholesterol medication and denies myalgias. Her cholesterol is not at goal. The cholesterol last visit was:   Lab Results  Component Value Date   CHOL 211* 07/18/2009   HDL 33.30* 07/18/2009   LDLCALC 126* 12/15/2007   LDLDIRECT 160.1 07/18/2009   TRIG 99.0 07/18/2009   CHOLHDL 6 07/18/2009     She has not been working on diet and exercise for prediabetes, and denies foot ulcerations, hyperglycemia, hypoglycemia , increased appetite, nausea, paresthesia of the feet, polydipsia, polyuria, visual disturbances, vomiting and weight loss. Last A1C in the office was: No results found for: HGBA1C  Patient is on Vitamin D supplement.  Right shoulder bothering her.  It has been going on for several months.  Popping with  certain movements.  Hurts right behind AC joint.  Right hand dominant.  Tylenol not helping.  5/10 pain.  No other relieving factors tried.     Current Medications:  Current Outpatient Prescriptions on File Prior to Visit  Medication Sig Dispense Refill  . atorvastatin (LIPITOR) 80 MG tablet Take 1 tablet (80 mg total) by mouth daily. 30 tablet 3  . benazepril (LOTENSIN) 40 MG tablet Take 1 tablet (40 mg total) by mouth daily. To last til next appointment 30 tablet 0  . hydrochlorothiazide (HYDRODIURIL) 25 MG tablet Take 1 tablet (25 mg total) by mouth daily. To last til next office visit 10 tablet 0   No current facility-administered medications on file prior to visit.    Medical History:  Past Medical History  Diagnosis Date  . Hyperlipidemia   . Hypertension   . Vitamin D deficiency   . Prediabetes     Allergies:  Allergies  Allergen Reactions  . Naproxen     REACTION: renal failure  . Nsaids      Review of Systems:  ROS  Family history- Review and unchanged  Social history- Review and unchanged  Physical Exam: BP 112/78 mmHg  Pulse 56  Temp(Src) 97.7 F (36.5 C)  Resp 16  Ht 5\' 4"  (1.626 m)  Wt 190 lb (86.183 kg)  BMI 32.60 kg/m2 Wt Readings from Last 3 Encounters:  02/03/15 190 lb (86.183 kg)  09/02/14 188 lb (85.276 kg)  07/18/09 203 lb (92.08 kg)  General Appearance: Well nourished well developed, in no apparent distress. Eyes: PERRLA, EOMs, conjunctiva no swelling or erythema ENT/Mouth: Ear canals normal without obstruction, swelling, erythma, discharge.  TMs normal bilaterally.  Oropharynx moist, clear, without exudate, or postoropharyngeal swelling. Neck: Supple, thyroid normal,no cervical adenopathy  Respiratory: Respiratory effort normal, Breath sounds clear A&P without rhonchi, wheeze, or rale.  No retractions, no accessory usage. Cardio: RRR with no MRGs. Brisk peripheral pulses without edema.  Abdomen: Soft, + BS,  Non tender, no guarding,  rebound, hernias, masses. Musculoskeletal: Full ROM, 5/5 strength, Normal gait.  Right shoulder without swelling, erythema or effusion.  Full active ROM.  5/5 strength testing in all fields.  Negative speeds test.  Negative empty can but mildly painful.  No TTP. Skin: Warm, dry without rashes, lesions, ecchymosis.  Neuro: Awake and oriented X 3, Cranial nerves intact. Normal muscle tone, no cerebellar symptoms. Psych: Normal affect, Insight and Judgment appropriate.    FORCUCCI, Dhanvi Boesen, PA-C 1:53 PM [facility]

## 2015-02-03 NOTE — Patient Instructions (Addendum)
   Recommend the book "The END of DIETING" by Dr Joel Fuhrman   & the book "The END of DIABETES " by Dr Joel Fuhrman  At [employer] - get book & Audio CD's      Being diabetic has a  300% increased risk for heart attack, stroke, cancer, and alzheimer- type vascular dementia. It is very important that you work harder with diet by avoiding all foods that are white. Avoid white rice (brown & wild rice is OK), white potatoes (sweetpotatoes in moderation is OK), White bread or wheat bread or anything made out of white flour like bagels, donuts, rolls, buns, biscuits, cakes, pastries, cookies, pizza crust, and pasta (made from white flour & egg whites) - vegetarian pasta or spinach or wheat pasta is OK. Multigrain breads like Arnold's or Pepperidge Farm, or multigrain sandwich thins or flatbreads.  Diet, exercise and weight loss can reverse and cure diabetes in the early stages.  Diet, exercise and weight loss is very important in the control and prevention of complications of diabetes which affects every system in your body, ie. Brain - dementia/stroke, eyes - glaucoma/blindness, heart - heart attack/heart failure, kidneys - dialysis, stomach - gastric paralysis, intestines - malabsorption, nerves - severe painful neuritis, circulation - gangrene & loss of a leg(s), and finally cancer and Alzheimers.    I recommend avoid fried & greasy foods,  sweets/candy, white rice (brown or wild rice or Quinoa is OK), white potatoes (sweet potatoes are OK) - anything made from white flour - bagels, doughnuts, rolls, buns, biscuits,white and wheat breads, pizza crust and traditional pasta made of white flour & egg white(vegetarian pasta or spinach or wheat pasta is OK).  Multi-grain bread is OK - like multi-grain flat bread or sandwich thins. Avoid alcohol in excess. Exercise is also important.    Eat all the vegetables you want - avoid meat, especially red meat and dairy - especially cheese.  Cheese is the most  concentrated form of trans-fats which is the worst thing to clog up our arteries. Veggie cheese is OK which can be found in the fresh produce section at [employer] or [employer] or [employer]     Bad carbs also include fruit juice, alcohol, and sweet tea. These are empty calories that do not signal to your brain that you are full.   Please remember the good carbs are still carbs which convert into sugar. So please measure them out no more than 1/2-1 cup of rice, oatmeal, pasta, and beans  Veggies are however free foods! Pile them on.   Not all fruit is created equal. Please see the list below, the fruit at the bottom is higher in sugars than the fruit at the top. Please avoid all dried fruits.     

## 2015-02-04 LAB — TSH: TSH: 1.312 u[IU]/mL (ref 0.350–4.500)

## 2015-02-04 LAB — BASIC METABOLIC PANEL WITH GFR
BUN: 14 mg/dL (ref 6–23)
CALCIUM: 9.6 mg/dL (ref 8.4–10.5)
CHLORIDE: 102 meq/L (ref 96–112)
CO2: 28 meq/L (ref 19–32)
CREATININE: 0.73 mg/dL (ref 0.50–1.10)
GFR, Est African American: >89 mL/min
GFR, Est Non African American: 87 mL/min
Glucose, Bld: 96 mg/dL (ref 70–99)
Potassium: 4 mEq/L (ref 3.5–5.3)
SODIUM: 141 meq/L (ref 135–145)

## 2015-02-04 LAB — HEPATIC FUNCTION PANEL
ALK PHOS: 55 U/L (ref 39–117)
ALT: 20 U/L (ref 0–35)
AST: 23 U/L (ref 0–37)
Albumin: 4.5 g/dL (ref 3.5–5.2)
BILIRUBIN DIRECT: 0.1 mg/dL (ref 0.0–0.3)
Indirect Bilirubin: 0.5 mg/dL (ref 0.2–1.2)
Total Bilirubin: 0.6 mg/dL (ref 0.2–1.2)
Total Protein: 7.5 g/dL (ref 6.0–8.3)

## 2015-02-04 LAB — CBC WITH DIFFERENTIAL/PLATELET
Basophils Absolute: 0.1 10*3/uL (ref 0.0–0.1)
Basophils Relative: 1 % (ref 0–1)
EOS ABS: 0.2 10*3/uL (ref 0.0–0.7)
EOS PCT: 2 % (ref 0–5)
HEMATOCRIT: 41.2 % (ref 36.0–46.0)
HEMOGLOBIN: 13.3 g/dL (ref 12.0–15.0)
LYMPHS ABS: 3.4 10*3/uL (ref 0.7–4.0)
LYMPHS PCT: 37 % (ref 12–46)
MCH: 27.5 pg (ref 26.0–34.0)
MCHC: 32.3 g/dL (ref 30.0–36.0)
MCV: 85.1 fL (ref 78.0–100.0)
MONO ABS: 0.7 10*3/uL (ref 0.1–1.0)
MONOS PCT: 7 % (ref 3–12)
MPV: 9.8 fL (ref 8.6–12.4)
Neutro Abs: 4.9 10*3/uL (ref 1.7–7.7)
Neutrophils Relative %: 53 % (ref 43–77)
Platelets: 249 10*3/uL (ref 150–400)
RBC: 4.84 MIL/uL (ref 3.87–5.11)
RDW: 13.6 % (ref 11.5–15.5)
WBC: 9.3 10*3/uL (ref 4.0–10.5)

## 2015-02-04 LAB — LIPID PANEL
Cholesterol: 143 mg/dL (ref 0–200)
HDL: 45 mg/dL — ABNORMAL LOW (ref >=46)
LDL Cholesterol: 83 mg/dL (ref 0–99)
TRIGLYCERIDES: 74 mg/dL (ref <150)
Total CHOL/HDL Ratio: 3.2 Ratio
VLDL: 15 mg/dL (ref 0–40)

## 2015-02-04 LAB — HEMOGLOBIN A1C
Hgb A1c MFr Bld: 6.1 % — ABNORMAL HIGH (ref <5.7)
Mean Plasma Glucose: 128 mg/dL — ABNORMAL HIGH (ref <117)

## 2015-02-04 LAB — VITAMIN D 25 HYDROXY (VIT D DEFICIENCY, FRACTURES): VIT D 25 HYDROXY: 42 ng/mL (ref 30–100)

## 2015-02-04 LAB — MAGNESIUM: Magnesium: 1.8 mg/dL (ref 1.5–2.5)

## 2015-02-04 LAB — INSULIN, FASTING: INSULIN FASTING, SERUM: 10.7 u[IU]/mL (ref 2.0–19.6)

## 2015-03-10 ENCOUNTER — Ambulatory Visit: Payer: Self-pay | Admitting: Internal Medicine

## 2015-05-19 ENCOUNTER — Ambulatory Visit: Payer: Self-pay | Admitting: Internal Medicine

## 2015-06-16 ENCOUNTER — Encounter: Payer: Self-pay | Admitting: Internal Medicine

## 2015-06-16 ENCOUNTER — Ambulatory Visit (INDEPENDENT_AMBULATORY_CARE_PROVIDER_SITE_OTHER): Payer: Commercial Managed Care - HMO | Admitting: Internal Medicine

## 2015-06-16 VITALS — BP 120/70 | HR 64 | Temp 97.3°F | Resp 16 | Ht 64.0 in | Wt 190.0 lb

## 2015-06-16 DIAGNOSIS — Z780 Asymptomatic menopausal state: Secondary | ICD-10-CM

## 2015-06-16 DIAGNOSIS — Z0000 Encounter for general adult medical examination without abnormal findings: Secondary | ICD-10-CM

## 2015-06-16 DIAGNOSIS — E559 Vitamin D deficiency, unspecified: Secondary | ICD-10-CM

## 2015-06-16 DIAGNOSIS — R7303 Prediabetes: Secondary | ICD-10-CM

## 2015-06-16 DIAGNOSIS — Z1212 Encounter for screening for malignant neoplasm of rectum: Secondary | ICD-10-CM

## 2015-06-16 DIAGNOSIS — Z1331 Encounter for screening for depression: Secondary | ICD-10-CM

## 2015-06-16 DIAGNOSIS — Z79899 Other long term (current) drug therapy: Secondary | ICD-10-CM

## 2015-06-16 DIAGNOSIS — E785 Hyperlipidemia, unspecified: Secondary | ICD-10-CM

## 2015-06-16 DIAGNOSIS — R6889 Other general symptoms and signs: Secondary | ICD-10-CM

## 2015-06-16 DIAGNOSIS — Z9181 History of falling: Secondary | ICD-10-CM

## 2015-06-16 DIAGNOSIS — R7309 Other abnormal glucose: Secondary | ICD-10-CM

## 2015-06-16 DIAGNOSIS — I10 Essential (primary) hypertension: Secondary | ICD-10-CM

## 2015-06-16 DIAGNOSIS — Z0001 Encounter for general adult medical examination with abnormal findings: Secondary | ICD-10-CM

## 2015-06-16 LAB — CBC WITH DIFFERENTIAL/PLATELET
Basophils Absolute: 0.1 10*3/uL (ref 0.0–0.1)
Basophils Relative: 1 % (ref 0–1)
EOS ABS: 0.2 10*3/uL (ref 0.0–0.7)
EOS PCT: 3 % (ref 0–5)
HEMATOCRIT: 41 % (ref 36.0–46.0)
Hemoglobin: 13.4 g/dL (ref 12.0–15.0)
LYMPHS ABS: 2.6 10*3/uL (ref 0.7–4.0)
LYMPHS PCT: 32 % (ref 12–46)
MCH: 27.6 pg (ref 26.0–34.0)
MCHC: 32.7 g/dL (ref 30.0–36.0)
MCV: 84.5 fL (ref 78.0–100.0)
MONO ABS: 0.6 10*3/uL (ref 0.1–1.0)
MONOS PCT: 8 % (ref 3–12)
MPV: 9.2 fL (ref 8.6–12.4)
NEUTROS ABS: 4.5 10*3/uL (ref 1.7–7.7)
NEUTROS PCT: 56 % (ref 43–77)
Platelets: 252 10*3/uL (ref 150–400)
RBC: 4.85 MIL/uL (ref 3.87–5.11)
RDW: 13.7 % (ref 11.5–15.5)
WBC: 8 10*3/uL (ref 4.0–10.5)

## 2015-06-16 LAB — HEPATIC FUNCTION PANEL
ALBUMIN: 4 g/dL (ref 3.5–5.2)
ALK PHOS: 49 U/L (ref 39–117)
ALT: 20 U/L (ref 0–35)
AST: 24 U/L (ref 0–37)
BILIRUBIN DIRECT: 0.1 mg/dL (ref 0.0–0.3)
Indirect Bilirubin: 0.3 mg/dL (ref 0.2–1.2)
Total Bilirubin: 0.4 mg/dL (ref 0.2–1.2)
Total Protein: 7.3 g/dL (ref 6.0–8.3)

## 2015-06-16 LAB — BASIC METABOLIC PANEL WITH GFR
BUN: 11 mg/dL (ref 6–23)
CO2: 27 mEq/L (ref 19–32)
CREATININE: 0.89 mg/dL (ref 0.50–1.10)
Calcium: 9.2 mg/dL (ref 8.4–10.5)
Chloride: 104 mEq/L (ref 96–112)
GFR, EST AFRICAN AMERICAN: 79 mL/min
GFR, Est Non African American: 68 mL/min
GLUCOSE: 91 mg/dL (ref 70–99)
Potassium: 3.7 mEq/L (ref 3.5–5.3)
SODIUM: 142 meq/L (ref 135–145)

## 2015-06-16 LAB — LIPID PANEL
Cholesterol: 169 mg/dL (ref 0–200)
HDL: 42 mg/dL — ABNORMAL LOW (ref >=46)
LDL CALC: 111 mg/dL — AB (ref 0–99)
TRIGLYCERIDES: 80 mg/dL (ref <150)
Total CHOL/HDL Ratio: 4 Ratio
VLDL: 16 mg/dL (ref 0–40)

## 2015-06-16 LAB — HEMOGLOBIN A1C
HEMOGLOBIN A1C: 6.3 % — AB (ref <5.7)
Mean Plasma Glucose: 134 mg/dL — ABNORMAL HIGH (ref <117)

## 2015-06-16 LAB — MAGNESIUM: Magnesium: 1.9 mg/dL (ref 1.5–2.5)

## 2015-06-16 LAB — TSH: TSH: 0.928 u[IU]/mL (ref 0.350–4.500)

## 2015-06-16 NOTE — Progress Notes (Deleted)
Patient ID: Lydia Edwards, female   DOB: 06-03-1951, 64 y.o.   MRN: [id number]   This very nice 64 y.o.female presents for 3 month follow up with Hypertension, Hyperlipidemia, Pre-Diabetes and Vitamin D Deficiency.    Patient is treated for HTN & BP has been controlled at home. Today's  . Patient has had no complaints of any cardiac type chest pain, palpitations, dyspnea/orthopnea/PND, dizziness, claudication, or dependent edema.   Hyperlipidemia is controlled with diet & meds. Patient denies myalgias or other med SE's. Last Lipids were at goal -  Cholesterol 143; HDL 45*; LDL 83; Triglycerides 74 on 02/03/2015.   Also, the patient has history of PreDiabetes and has had no symptoms of reactive hypoglycemia, diabetic polys, paresthesias or visual blurring.  Last A1c was  6.1% on 02/03/2015.   Further, the patient also has history of Vitamin D Deficiency and supplements vitamin D without any suspected side-effects. Last vitamin D was  42 on 02/03/2015.      Medication Sig  . atorvastatin (LIPITOR) 40 MG tablet Take 1 tablet (40 mg total) by mouth daily.  . benazepril (LOTENSIN) 40 MG tablet Take 1 tablet (40 mg total) by mouth daily. To last til next appointment  . hctz 25 MG tablet Take 1 tablet (25 mg total) by mouth daily. To last til next office visit   Allergies  Allergen Reactions  . Naproxen     REACTION: renal failure  . Nsaids    PMHx:   Past Medical History  Diagnosis Date  . Hyperlipidemia   . Hypertension   . Vitamin D deficiency   . Prediabetes    Immunization History  Administered Date(s) Administered  . Td 02/19/2000   FHx:    Reviewed / unchanged  SHx:    Reviewed / unchanged  Systems Review:  Constitutional: Denies fever, chills, wt changes, headaches, insomnia, fatigue, night sweats, change in appetite. Eyes: Denies redness, blurred vision, diplopia, discharge, itchy, watery eyes.  ENT: Denies discharge, congestion, post nasal drip, epistaxis, sore throat, earache,  hearing loss, dental pain, tinnitus, vertigo, sinus pain, snoring.  CV: Denies chest pain, palpitations, irregular heartbeat, syncope, dyspnea, diaphoresis, orthopnea, PND, claudication or edema. Respiratory: denies cough, dyspnea, DOE, pleurisy, hoarseness, laryngitis, wheezing.  Gastrointestinal: Denies dysphagia, odynophagia, heartburn, reflux, water brash, abdominal pain or cramps, nausea, vomiting, bloating, diarrhea, constipation, hematemesis, melena, hematochezia  or hemorrhoids. Genitourinary: Denies dysuria, frequency, urgency, nocturia, hesitancy, discharge, hematuria or flank pain. Musculoskeletal: Denies arthralgias, myalgias, stiffness, jt. swelling, pain, limping or strain/sprain.  Skin: Denies pruritus, rash, hives, warts, acne, eczema or change in skin lesion(s). Neuro: No weakness, tremor, incoordination, spasms, paresthesia or pain. Psychiatric: Denies confusion, memory loss or sensory loss. Endo: Denies change in weight, skin or hair change.  Heme/Lymph: No excessive bleeding, bruising or enlarged lymph nodes.  Physical Exam  There were no vitals taken for this visit.  Appears well nourished and in no distress. Eyes: PERRLA, EOMs, conjunctiva no swelling or erythema. Sinuses: No frontal/maxillary tenderness ENT/Mouth: EAC's clear, TM's nl w/o erythema, bulging. Nares clear w/o erythema, swelling, exudates. Oropharynx clear without erythema or exudates. Oral hygiene is good. Tongue normal, non obstructing. Hearing intact.  Neck: Supple. Thyroid nl. Car 2+/2+ without bruits, nodes or JVD. Chest: Respirations nl with BS clear & equal w/o rales, rhonchi, wheezing or stridor.  Cor: Heart sounds normal w/ regular rate and rhythm without sig. murmurs, gallops, clicks, or rubs. Peripheral pulses normal and equal  without edema.  Abdomen: Soft & bowel sounds normal.  Non-tender w/o guarding, rebound, hernias, masses, or organomegaly.  Lymphatics: Unremarkable.  Musculoskeletal: Full  ROM all peripheral extremities, joint stability, 5/5 strength, and normal gait.  Skin: Warm, dry without exposed rashes, lesions or ecchymosis apparent.  Neuro: Cranial nerves intact, reflexes equal bilaterally. Sensory-motor testing grossly intact. Tendon reflexes grossly intact.  Pysch: Alert & oriented x 3.  Insight and judgement nl & appropriate. No ideations.  Assessment and Plan:   Recommended regular exercise, BP monitoring, weight control, and discussed med and SE's. Recommended labs to assess and monitor clinical status. Further disposition pending results of labs. Over 30 minutes of exam, counseling, chart review was performed

## 2015-06-16 NOTE — Patient Instructions (Addendum)
Recommend Adult Low dose Aspirin or coated  Aspirin 81 mg daily   To reduce risk of Colon Cancer 20 %,   Skin Cancer 26 % ,   Melanoma 46%   and   Pancreatic cancer 60% ++++++++++++++++++ Vitamin D goal is between 70-100.   Please make sure that you are taking your Vitamin D as directed.   It is very important as a natural anti-inflammatory   helping hair, skin, and nails, as well as reducing stroke and heart attack risk.   It helps your bones and helps with mood.  It also decreases numerous cancer risks so please take it as directed.   Low Vit D is associated with a 200-300% higher risk for CANCER   and 200-300% higher risk for HEART   ATTACK  &  STROKE.   .....................................Lydia Edwards  It is also associated with higher death rate at younger ages,   autoimmune diseases like Rheumatoid arthritis, Lupus, Multiple Sclerosis.     Also many other serious conditions, like depression, Alzheimer's  Dementia, infertility, muscle aches, fatigue, fibromyalgia - just to name a few.  +++++++++++++++++++  Recommend the book "The END of DIETING" by Dr Excell Seltzer   & the book "The END of DIABETES " by Dr Excell Seltzer  At [email or web address] - get book & Audio CD's     Being diabetic has a  300% increased risk for heart attack, stroke, cancer, and alzheimer- type vascular dementia. It is very important that you work harder with diet by avoiding all foods that are white. Avoid white rice (brown & wild rice is OK), white potatoes (sweetpotatoes in moderation is OK), White bread or wheat bread or anything made out of white flour like bagels, donuts, rolls, buns, biscuits, cakes, pastries, cookies, pizza crust, and pasta (made from white flour & egg whites) - vegetarian pasta or spinach or wheat pasta is OK. Multigrain breads like Arnold's or Pepperidge Farm, or multigrain sandwich thins or flatbreads.  Diet, exercise and weight loss can reverse and cure diabetes in the early stages.   Diet, exercise and weight loss is very important in the control and prevention of complications of diabetes which affects every system in your body, ie. Brain - dementia/stroke, eyes - glaucoma/blindness, heart - heart attack/heart failure, kidneys - dialysis, stomach - gastric paralysis, intestines - malabsorption, nerves - severe painful neuritis, circulation - gangrene & loss of a leg(s), and finally cancer and Alzheimers.    I recommend avoid fried & greasy foods,  sweets/candy, white rice (brown or wild rice or Quinoa is OK), white potatoes (sweet potatoes are OK) - anything made from white flour - bagels, doughnuts, rolls, buns, biscuits,white and wheat breads, pizza crust and traditional pasta made of white flour & egg white(vegetarian pasta or spinach or wheat pasta is OK).  Multi-grain bread is OK - like multi-grain flat bread or sandwich thins. Avoid alcohol in excess. Exercise is also important.    Eat all the vegetables you want - avoid meat, especially red meat and dairy - especially cheese.  Cheese is the most concentrated form of trans-fats which is the worst thing to clog up our arteries. Veggie cheese is OK which can be found in the fresh produce section at [facility] or [employer] or [employer]  ++++++++++++++++++++++++++  Bone Densitometry Bone densitometry is a special X-ray that measures your bone density and can be used to help predict your risk of bone fractures. This test is used to determine bone mineral content and density  to diagnose osteoporosis. Osteoporosis is the loss of bone that may cause the bone to become weak. Osteoporosis commonly occurs in women entering menopause. However, it may be found in men and in people with other diseases. PREPARATION FOR TEST No preparation necessary. WHO SHOULD BE TESTED?  All women older than 70.  Postmenopausal women (50 to 52) with risk factors for osteoporosis.  People with a previous fracture caused by normal  activities.  People with a small body frame (less than 127 poundsor a body mass index [BMI] of less than 21).  People who have a parent with a hip fracture or history of osteoporosis.  People who smoke.  People who have rheumatoid arthritis.  Anyone who engages in excessive alcohol use (more than 3 drinks most days).  Women who experience early menopause. WHEN SHOULD YOU BE RETESTED? Current guidelines suggest that you should wait at least 2 years before doing a bone density test again if your first test was normal.Recent studies indicated that women with normal bone density may be able to wait a few years before needing to repeat a bone density test. You should discuss this with your caregiver.  NORMAL FINDINGS   Normal: less than standard deviation below normal (greater than -1).  Osteopenia: 1 to 2.5 standard deviations below normal (-1 to -2.5).  Osteoporosis: greater than 2.5 standard deviations below normal (less than -2.5). Test results are reported as a "T score" and a "Z score."The T score is a number that compares your bone density with the bone density of healthy, young women.The Z score is a number that compares your bone density with the scores of women who are the same age, gender, and race.  Ranges for normal findings may vary among different laboratories and hospitals. You should always check with your doctor after having lab work or other tests done to discuss the meaning of your test results and whether your values are considered within normal limits. MEANING OF TEST  Your caregiver will go over the test results with you and discuss the importance and meaning of your results, as well as treatment options and the need for additional tests if necessary. OBTAINING THE TEST RESULTS It is your responsibility to obtain your test results. Ask the lab or department performing the test when and how you will get your results. Document Released: 12/07/2004 Document Revised:  02/07/2012 Document Reviewed: 12/30/2010 [facility] Patient Information 2015 [place], [place]. This information is not intended to replace advice given to you by your health care provider. Make sure you discuss any questions you have with your health care provider.

## 2015-06-16 NOTE — Progress Notes (Signed)
WELCOME TO MEDICARE VISIT AND 3 MONTH FOLLOW UP  Assessment:    1. Essential hypertension -cont meds -monitor at home -DASH diet -cardio exercise - TSH  2. Hyperlipidemia -cont meds -diet and exercise - Lipid panel  3. Prediabetes  - Hemoglobin A1c - Insulin, random  4. Vitamin D deficiency -cont supplement - Vit D  25 hydroxy (rtn osteoporosis monitoring)  5. Medication management  - CBC with Differential/Platelet - BASIC METABOLIC PANEL WITH GFR - Hepatic function panel - Magnesium  6. Morbid obesity (32.60) -diet and exercise  7. Post-menopausal  - DG Bone Density; Future - MM Digital Screening; Future  8. Screening for rectal cancer  - Ambulatory referral to Gastroenterology  Patient declined injections today but is due for zostavax, Td, pneumovax, prevnar  Over 30 minutes of exam, counseling, chart review, and critical decision making was performed  Plan:   During the course of the visit the patient was educated and counseled about appropriate screening and preventive services including:    Pneumococcal vaccine   Influenza vaccine  Td vaccine  Prevnar 13  Screening electrocardiogram  Screening mammography  Bone densitometry screening  Colorectal cancer screening  Diabetes screening  Glaucoma screening  Nutrition counseling   Advanced directives: given info/requested copies  Conditions/risks identified: Diabetes is at goal, ACE/ARB therapy: Yes. Urinary Incontinence is an issue: discussed non pharmacology and pharmacology options.  Fall risk: low- discussed PT, home fall assessment, medications.    Subjective:   Lydia Edwards is a 64 y.o. female who presents for Medicare Annual Wellness Visit and 3 month follow up on hypertension, prediabetes, hyperlipidemia, vitamin D def.  Date of last medicare wellness visit is unknown.   Her blood pressure has been controlled at home, today their BP is BP: 120/70 mmHg She does  workout.  She does a lot of walking.  She walks at least a mile daily. She denies chest pain, shortness of breath, dizziness.   She is on cholesterol medication and denies myalgias. Her cholesterol is at goal. The cholesterol last visit was:   Lab Results  Component Value Date   CHOL 143 02/03/2015   HDL 45* 02/03/2015   LDLCALC 83 02/03/2015   LDLDIRECT 160.1 07/18/2009   TRIG 74 02/03/2015   CHOLHDL 3.2 02/03/2015   She has been working on diet and exercise for prediabetes, and denies foot ulcerations, hyperglycemia, hypoglycemia , increased appetite, nausea, paresthesia of the feet, polydipsia, polyuria, visual disturbances, vomiting and weight loss. Last A1C in the office was:  Lab Results  Component Value Date   HGBA1C 6.1* 02/03/2015   Patient is on Vitamin D supplement. Lab Results  Component Value Date   VD25OH 42 02/03/2015      Medication Review Current Outpatient Prescriptions on File Prior to Visit  Medication Sig Dispense Refill  . atorvastatin (LIPITOR) 40 MG tablet Take 1 tablet (40 mg total) by mouth daily. 30 tablet 3  . benazepril (LOTENSIN) 40 MG tablet Take 1 tablet (40 mg total) by mouth daily. To last til next appointment 30 tablet 3  . hydrochlorothiazide (HYDRODIURIL) 25 MG tablet Take 1 tablet (25 mg total) by mouth daily. To last til next office visit 30 tablet 3   No current facility-administered medications on file prior to visit.    Current Problems (verified) Patient Active Problem List   Diagnosis Date Noted  . Morbid obesity (32.60) 06/16/2015  . Medication management 09/02/2014  . Hyperlipidemia   . Hypertension   . Vitamin D deficiency   .  Prediabetes   . RENAL FAILURE--NEPHROTIC [facility] CHANGE 07/18/2009  . SYNDROME, NEPHROTIC W/MINIMAL CHANGE LESION 07/12/2007  . COLONOSCOPY, HX OF 07/11/2007    Screening Tests Immunization History  Administered Date(s) Administered  . Td 02/19/2000    Preventative care: Last  colonoscopy: 1 year over due, will put in a refferal Last mammogram: Due, ordered today Last pap smear/pelvic exam:    DEXA: Ordered today  Prior vaccinations: TD or Tdap: Patient is due for tetanus  Influenza: she gets this done at her church  Pneumococcal: Due Prevnar13: Due Shingles/Zostavax: Due  Names of Other Physician/Practitioners you currently use: 1. [facility]- here for primary care 2. Dr. Ricki Miller, eye doctor, last visit 1.5 years ago. 3. [facility], last visit 2 years ago Patient Care Team: Unk Pinto, MD as PCP - General (Internal Medicine) Vania Rea, MD as Consulting Physician (Obstetrics and Gynecology) Lafayette Dragon, MD as Consulting Physician (Gastroenterology)  No past surgical history on file. Family History  Problem Relation Age of Onset  . Stroke Sister   . Hypertension Sister   . Cancer Sister   . Heart attack Brother    History  Substance Use Topics  . Smoking status: Never Smoker   . Smokeless tobacco: Never Used  . Alcohol Use: Not on file    MEDICARE WELLNESS OBJECTIVES: Tobacco use: She does not smoke.  Patient is not a former smoker. If yes, counseling given Alcohol Current alcohol use: none Osteoporosis: postmenopausal estrogen deficiency, History of fracture in the past year: no Fall risk: Minimal risk Hearing: normal Visual acuity: normal,  does not perform annual eye exam Diet: in general, a "healthy" diet   Physical activity: Current Exercise Habits:: Home exercise routine, Type of exercise: walking, Time (Minutes): 30, Frequency (Times/Week): 4, Weekly Exercise (Minutes/Week): 120, Intensity: Moderate Cardiac risk factors: Cardiac Risk Factors include: advanced age (>30men, >28 women);dyslipidemia;hypertension;sedentary lifestyle Depression/mood screen:   Depression screen [facility] 2/9 06/16/2015  Decreased Interest 0  Down, Depressed, Hopeless 0  PHQ - 2 Score 0    ADLs:  In  your present state of health, do you have any difficulty performing the following activities: 06/16/2015  Hearing? N  Vision? N  Difficulty concentrating or making decisions? N  Walking or climbing stairs? N  Dressing or bathing? N  Doing errands, shopping? N  Preparing Food and eating ? N  Using the Toilet? N  In the past six months, have you accidently leaked urine? N  Do you have problems with loss of bowel control? N  Managing your Medications? N  Managing your Finances? N  Housekeeping or managing your Housekeeping? N     Cognitive Testing  Alert? Yes  Normal Appearance?Yes  Oriented to person? Yes  Place? Yes   Time? Yes  Recall of three objects?  Yes  Can perform simple calculations? Yes  Displays appropriate judgment?Yes  Can read the correct time from a watch face?Yes  EOL planning: Does patient have an advance directive?: No Would patient like information on creating an advanced directive?: Yes - Educational materials given   Review of Systems  Constitutional: Negative for fever, chills, weight loss and malaise/fatigue.  HENT: Negative for congestion, ear pain and sore throat.   Eyes: Negative.   Respiratory: Negative for cough, shortness of breath and wheezing.   Cardiovascular: Negative for chest pain, palpitations and leg swelling.  Gastrointestinal: Negative for heartburn, diarrhea, constipation, blood in stool and melena.  Genitourinary: Positive for urgency and frequency.  Negative for dysuria and hematuria.  Skin: Negative.   Neurological: Negative for dizziness, sensory change, loss of consciousness and headaches.  Psychiatric/Behavioral: Negative for depression. The patient is not nervous/anxious and does not have insomnia.     Objective:   Today's Vitals   06/16/15 1106  BP: 120/70  Pulse: 64  Temp: 97.3 F (36.3 C)  Resp: 16  Height: 5\' 4"  (1.626 m)  Weight: 190 lb (86.183 kg)   Body mass index is 32.6 kg/(m^2).  General appearance: alert, no  distress, WD/WN,  female HEENT: normocephalic, sclerae anicteric, TMs pearly, nares patent, no discharge or erythema, pharynx normal Oral cavity: MMM, no lesions Neck: supple, no lymphadenopathy, no thyromegaly, no masses Heart: RRR, normal S1, S2, no murmurs Lungs: CTA bilaterally, no wheezes, rhonchi, or rales Abdomen: +bs, soft, non tender, non distended, no masses, no hepatomegaly, no splenomegaly Musculoskeletal: nontender, no swelling, no obvious deformity Extremities: no edema, no cyanosis, no clubbing Pulses: 2+ symmetric, upper and lower extremities, normal cap refill Neurological: alert, oriented x 3, CN2-12 intact, strength normal upper extremities and lower extremities, sensation normal throughout, DTRs 2+ throughout, no cerebellar signs, gait normal Psychiatric: normal affect, behavior normal, pleasant  Breast: defer Gyn: defer Rectal: defer   Medicare Attestation I have personally reviewed: The patient's medical and social history Their use of alcohol, tobacco or illicit drugs Their current medications and supplements The patient's functional ability including ADLs,fall risks, home safety risks, cognitive, and hearing and visual impairment Diet and physical activities Evidence for depression or mood disorders  The patient's weight, height, BMI, and visual acuity have been recorded in the chart.  I have made referrals, counseling, and provided education to the patient based on review of the above and I have provided the patient with a written personalized care plan for preventive services.     Starlyn Skeans, PA-C   06/16/2015

## 2015-06-17 LAB — INSULIN, RANDOM: Insulin: 16.5 u[IU]/mL (ref 2.0–19.6)

## 2015-06-17 LAB — VITAMIN D 25 HYDROXY (VIT D DEFICIENCY, FRACTURES): Vit D, 25-Hydroxy: 41 ng/mL (ref 30–100)

## 2015-06-24 ENCOUNTER — Ambulatory Visit (INDEPENDENT_AMBULATORY_CARE_PROVIDER_SITE_OTHER): Payer: Commercial Managed Care - HMO | Admitting: Internal Medicine

## 2015-06-24 VITALS — BP 118/64 | HR 74 | Temp 98.0°F | Resp 16 | Ht 64.0 in | Wt 188.0 lb

## 2015-06-24 DIAGNOSIS — R300 Dysuria: Secondary | ICD-10-CM

## 2015-06-24 DIAGNOSIS — R109 Unspecified abdominal pain: Secondary | ICD-10-CM

## 2015-06-24 MED ORDER — HYDROCODONE-ACETAMINOPHEN 5-325 MG PO TABS: 1.0000 | ORAL_TABLET | Freq: Four times a day (QID) | ORAL | Status: DC | PRN

## 2015-06-24 MED ORDER — CIPROFLOXACIN HCL 500 MG PO TABS: 500.0000 mg | ORAL_TABLET | Freq: Two times a day (BID) | ORAL | Status: AC

## 2015-06-24 MED ORDER — CYCLOBENZAPRINE HCL 10 MG PO TABS: ORAL_TABLET | ORAL | Status: DC

## 2015-06-24 NOTE — Patient Instructions (Signed)
Flank Pain °Flank pain refers to pain that is located on the side of the body between the upper abdomen and the back. The pain may occur over a short period of time (acute) or may be long-term or reoccurring (chronic). It may be mild or severe. Flank pain can be caused by many things. °CAUSES  °Some of the more common causes of flank pain include: °· Muscle strains.   °· Muscle spasms.   °· A disease of your spine (vertebral disk disease).   °· A lung infection (pneumonia).   °· Fluid around your lungs (pulmonary edema).   °· A kidney infection.   °· Kidney stones.   °· A very painful skin rash caused by the chickenpox virus (shingles).   °· Gallbladder disease.   °HOME CARE INSTRUCTIONS  °Home care will depend on the cause of your pain. In general, °· Rest as directed by your caregiver. °· Drink enough fluids to keep your urine clear or pale yellow. °· Only take over-the-counter or prescription medicines as directed by your caregiver. Some medicines may help relieve the pain. °· Tell your caregiver about any changes in your pain. °· Follow up with your caregiver as directed. °SEEK IMMEDIATE MEDICAL CARE IF:  °· Your pain is not controlled with medicine.   °· You have new or worsening symptoms. °· Your pain increases.   °· You have abdominal pain.   °· You have shortness of breath.   °· You have persistent nausea or vomiting.   °· You have swelling in your abdomen.   °· You feel faint or pass out.   °· You have blood in your urine. °· You have a fever or persistent symptoms for more than 2-3 days. °· You have a fever and your symptoms suddenly get worse. °MAKE SURE YOU:  °· Understand these instructions. °· Will watch your condition. °· Will get help right away if you are not doing well or get worse. °Document Released: 01/06/2006 Document Revised: 08/09/2012 Document Reviewed: 06/29/2012 °ExitCare® Patient Information ©2015 [employer]. This information is not intended to replace advice given to you by your  health care provider. Make sure you discuss any questions you have with your health care provider. ° °

## 2015-06-24 NOTE — Progress Notes (Signed)
Subjective:    Patient ID: Lydia Edwards, female    DOB: 19-Oct-1951, 64 y.o.   MRN: [id number]  Flank Pain Pertinent negatives include no abdominal pain, dysuria or fever.  Patient presents to the office for evaluation of left sided flank pain since Sunday.  She reports that she does have some urgency and feeling like her bladder is not completely empty.  She reports that she has been going a little more frequently.  She reports that she has been taking some tylenol and also using some excedrine.  She reports that they haven't helped much.  She reports that she does not have any blood in her urine, no foul odors, and no dysuria.  She reports no fevers, chills, nausea, vomiting, or abdominal pain.    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  She reports that her pain is about the same.  She does have some pain with moving forward and bending.  She reports no injury she can think of and no history of kidney stones or UTIs.   Review of Systems  Constitutional: Negative for fever, chills and fatigue.  Gastrointestinal: Negative for nausea, vomiting and abdominal pain.  Genitourinary: Positive for urgency, frequency and flank pain. Negative for dysuria, hematuria and difficulty urinating.  Musculoskeletal: Positive for back pain.       Objective:   Physical Exam  Constitutional: She is oriented to person, place, and time. She appears well-developed and well-nourished. No distress.  HENT:  Head: Normocephalic and atraumatic.  Mouth/Throat: Oropharynx is clear and moist. No oropharyngeal exudate.  Eyes: Conjunctivae are normal. No scleral icterus.  Neck: Normal range of motion. Neck supple. No JVD present. No thyromegaly present.  Cardiovascular: Normal rate, regular rhythm, normal heart sounds and intact distal pulses.  Exam reveals no gallop and no friction rub.   No murmur heard. Pulmonary/Chest: Effort normal and breath sounds normal. No respiratory distress. She  has no wheezes. She has no rales. She exhibits no tenderness.  Abdominal: Soft. Bowel sounds are normal. She exhibits no distension and no mass. There is tenderness. There is CVA tenderness (left cva tenderness). There is no rigidity, no rebound, no guarding, no tenderness at McBurney's point and negative Murphy's sign.  Musculoskeletal:  Patient rises slowly from sitting to standing.  They walk without an antalgic gait.  There is no evidence of erythema, ecchymosis, or gross deformity.  There is tenderness to palpation over left flank and bilateral lumbar paraspinal muscles.  No real bony spine tenderness to palpation.  Active ROM is limited due to pain with forward flexion and extension.  Sensation to light touch is intact over all extremities.  Strength is symmetric and equal in all extremities.    Lymphadenopathy:    She has no cervical adenopathy.  Neurological: She is alert and oriented to person, place, and time.  Skin: Skin is warm and dry. She is not diaphoretic.  Psychiatric: She has a normal mood and affect. Her behavior is normal. Judgment and thought content normal.  Nursing note and vitals reviewed.   Filed Vitals:   06/24/15 1135  BP: 118/64  Pulse: 74  Temp: 98 F (36.7 C)  Resp: 16         Assessment & Plan:  Musculoskeletal pain vs. Early pyelonephritis vs. UTI vs. Kidney stones. If significant blood consider renal US vs. CT abdomen pelvis  1. Left flank pain  - Urinalysis, Routine w reflex microscopic (not at [facility]) - Culture,  Urine - ciprofloxacin (CIPRO) 500 MG tablet; Take 1 tablet (500 mg total) by mouth 2 (two) times daily.  Dispense: 14 tablet; Refill: 0 - cyclobenzaprine (FLEXERIL) 10 MG tablet; 1/2 to 1 tablet q8hrs prn for msucle spasm and back pain.  Dispense: 30 tablet; Refill: 1 - HYDROcodone-acetaminophen (NORCO) 5-325 MG per tablet; Take 1 tablet by mouth every 6 (six) hours as needed.  Dispense: 20 tablet; Refill: 0  2. Dysuria  - Urinalysis,  Routine w reflex microscopic (not at [facility] - Culture, Urine - ciprofloxacin (CIPRO) 500 MG tablet; Take 1 tablet (500 mg total) by mouth 2 (two) times daily.  Dispense: 14 tablet; Refill: 0 - cyclobenzaprine (FLEXERIL) 10 MG tablet; 1/2 to 1 tablet q8hrs prn for msucle spasm and back pain.  Dispense: 30 tablet; Refill: 1 - HYDROcodone-acetaminophen (NORCO) 5-325 MG per tablet; Take 1 tablet by mouth every 6 (six) hours as needed.  Dispense: 20 tablet; Refill: 0

## 2015-06-25 LAB — URINALYSIS, MICROSCOPIC ONLY
Casts: NONE SEEN [LPF]
RBC / HPF: NONE SEEN RBC/HPF (ref <=2)
WBC, UA: NONE SEEN WBC/HPF (ref <=5)
YEAST: NONE SEEN [HPF]

## 2015-06-25 LAB — URINALYSIS, ROUTINE W REFLEX MICROSCOPIC
BILIRUBIN URINE: NEGATIVE
Glucose, UA: NEGATIVE
HGB URINE DIPSTICK: NEGATIVE
Ketones, ur: NEGATIVE
NITRITE: NEGATIVE
Protein, ur: NEGATIVE
SPECIFIC GRAVITY, URINE: 1.023 (ref 1.001–1.035)
pH: 5.5 (ref 5.0–8.0)

## 2015-06-26 LAB — URINE CULTURE
Colony Count: NO GROWTH
ORGANISM ID, BACTERIA: NO GROWTH

## 2015-06-30 ENCOUNTER — Encounter: Payer: Self-pay | Admitting: Gastroenterology

## 2015-06-30 ENCOUNTER — Ambulatory Visit (HOSPITAL_COMMUNITY)
Admission: RE | Admit: 2015-06-30 | Discharge: 2015-06-30 | Disposition: A | Discharge status: Home / Self Care | Payer: Commercial Managed Care - HMO | Source: Ambulatory Visit | Attending: Internal Medicine | Admitting: Internal Medicine

## 2015-06-30 ENCOUNTER — Telehealth: Payer: Self-pay | Admitting: Internal Medicine

## 2015-06-30 ENCOUNTER — Other Ambulatory Visit: Payer: Self-pay | Admitting: Internal Medicine

## 2015-06-30 DIAGNOSIS — Z1231 Encounter for screening mammogram for malignant neoplasm of breast: Secondary | ICD-10-CM | POA: Diagnosis not present

## 2015-06-30 DIAGNOSIS — Z780 Asymptomatic menopausal state: Secondary | ICD-10-CM

## 2015-06-30 NOTE — Telephone Encounter (Signed)
Patient given lab results today 06/30/15 by Raquel Sarna.

## 2015-07-25 ENCOUNTER — Other Ambulatory Visit: Payer: Self-pay | Admitting: Internal Medicine

## 2015-08-09 ENCOUNTER — Other Ambulatory Visit: Payer: Self-pay | Admitting: Internal Medicine

## 2015-08-27 ENCOUNTER — Ambulatory Visit (AMBULATORY_SURGERY_CENTER): Payer: Self-pay

## 2015-08-27 VITALS — Ht 63.0 in | Wt 185.0 lb

## 2015-08-27 DIAGNOSIS — Z1211 Encounter for screening for malignant neoplasm of colon: Secondary | ICD-10-CM

## 2015-08-27 MED ORDER — SUPREP BOWEL PREP KIT 17.5-3.13-1.6 GM/177ML PO SOLN: 1.0000 | Freq: Once | ORAL | Status: DC

## 2015-08-27 NOTE — Progress Notes (Signed)
No allergies to eggs or soy No home oxygen No diet/weight loss meds No past problems with anesthesia  Refused emmi 

## 2015-09-10 ENCOUNTER — Ambulatory Visit (AMBULATORY_SURGERY_CENTER): Payer: Commercial Managed Care - HMO | Admitting: Gastroenterology

## 2015-09-10 ENCOUNTER — Encounter: Payer: Self-pay | Admitting: Gastroenterology

## 2015-09-10 VITALS — BP 106/69 | HR 61 | Temp 97.1°F | Resp 24 | Ht 63.0 in | Wt 185.0 lb

## 2015-09-10 DIAGNOSIS — Z1211 Encounter for screening for malignant neoplasm of colon: Secondary | ICD-10-CM | POA: Diagnosis present

## 2015-09-10 DIAGNOSIS — K635 Polyp of colon: Secondary | ICD-10-CM

## 2015-09-10 DIAGNOSIS — D125 Benign neoplasm of sigmoid colon: Secondary | ICD-10-CM

## 2015-09-10 MED ORDER — SODIUM CHLORIDE 0.9 % IV SOLN: 500.0000 mL | INTRAVENOUS | Status: DC

## 2015-09-10 NOTE — Op Note (Signed)
[facility]  [address] [place], [address]   COLONOSCOPY PROCEDURE REPORT  PATIENT: Lydia, Edwards  MR#: [id number] BIRTHDATE: Apr 03, 1951 , 3  yrs. old GENDER: female ENDOSCOPIST: Yetta Flock, MD REFERRED BY: Unk Pinto MD PROCEDURE DATE:  09/10/2015 PROCEDURE:   Colonoscopy, screening and Colonoscopy with biopsy First Screening Colonoscopy - Avg.  risk and is 50 yrs.  old or older - No.  Prior Negative Screening - Now for repeat screening. 10 or more years since last screening  History of Adenoma - Now for follow-up colonoscopy & has been > or = to 3 yrs.  N/A  Polyps removed today? Yes ASA CLASS:   Class II INDICATIONS:Screening for colonic neoplasia and Colorectal Neoplasm Risk Assessment for this procedure is average risk. MEDICATIONS: Propofol 220 mg IV  DESCRIPTION OF PROCEDURE:   After the risks benefits and alternatives of the procedure were thoroughly explained, informed consent was obtained.  The digital rectal exam revealed no abnormalities of the rectum.   The LB [id number]-H190 [id number]  endoscope was introduced through the anus and advanced to the cecum, which was identified by both the appendix and ileocecal valve. No adverse events experienced.   The quality of the prep was adequate  The instrument was then slowly withdrawn as the colon was fully examined. Estimated blood loss is zero unless otherwise noted in this procedure report.   COLON FINDINGS: The sigmoid colon was tortous.  Two polyps were noted in the sigmoid colon, roughly 69mm in size, flat, grossly consistent with hyperplastic polyps, removed with cold forceps. The remainder of the examined colon was normal, no other polyps or mass lesions noted.  Retroflexed views revealed internal hemorrhoids. The time to cecum = 5.5 Withdrawal time = 13.6   The scope was withdrawn and the procedure completed. COMPLICATIONS: There were no immediate complications.  ENDOSCOPIC  IMPRESSION: Tortous sigmoid colon 2 x 30mm polyps in the sigmoid colon, suspect hyperplastic, removed with cold forceps Small internal hemorrhoids  RECOMMENDATIONS: Await pathology results Resume diet Resume medications  eSigned:  Yetta Flock, MD 09/10/2015 11:02 AM   cc:   Unk Pinto, the patient

## 2015-09-10 NOTE — Progress Notes (Signed)
No problems noted in the recovery room. maw 

## 2015-09-10 NOTE — Patient Instructions (Signed)
YOU HAD AN ENDOSCOPIC PROCEDURE TODAY AT THE [facility]:   Refer to the procedure report that was given to you for any specific questions about what was found during the examination.  If the procedure report does not answer your questions, please call your gastroenterologist to clarify.  If you requested that your care partner not be given the details of your procedure findings, then the procedure report has been included in a sealed envelope for you to review at your convenience later.  YOU SHOULD EXPECT: Some feelings of bloating in the abdomen. Passage of more gas than usual.  Walking can help get rid of the air that was put into your GI tract during the procedure and reduce the bloating. If you had a lower endoscopy (such as a colonoscopy or flexible sigmoidoscopy) you may notice spotting of blood in your stool or on the toilet paper. If you underwent a bowel prep for your procedure, you may not have a normal bowel movement for a few days.  Please Note:  You might notice some irritation and congestion in your nose or some drainage.  This is from the oxygen used during your procedure.  There is no need for concern and it should clear up in a day or so.  SYMPTOMS TO REPORT IMMEDIATELY:   Following lower endoscopy (colonoscopy or flexible sigmoidoscopy):  Excessive amounts of blood in the stool  Significant tenderness or worsening of abdominal pains  Swelling of the abdomen that is new, acute  Fever of 100F or higher   For urgent or emergent issues, a gastroenterologist can be reached at any hour by calling [phone].   DIET: Your first meal following the procedure should be a small meal and then it is ok to progress to your normal diet. Heavy or fried foods are harder to digest and may make you feel nauseous or bloated.  Likewise, meals heavy in dairy and vegetables can increase bloating.  Drink plenty of fluids but you should avoid alcoholic beverages for 24  hours.  ACTIVITY:  You should plan to take it easy for the rest of today and you should NOT DRIVE or use heavy machinery until tomorrow (because of the sedation medicines used during the test).    FOLLOW UP: Our staff will call the number listed on your records the next business day following your procedure to check on you and address any questions or concerns that you may have regarding the information given to you following your procedure. If we do not reach you, we will leave a message.  However, if you are feeling well and you are not experiencing any problems, there is no need to return our call.  We will assume that you have returned to your regular daily activities without incident.  If any biopsies were taken you will be contacted by phone or by letter within the next 1-3 weeks.  Please call us at [phone] if you have not heard about the biopsies in 3 weeks.    SIGNATURES/CONFIDENTIALITY: You and/or your care partner have signed paperwork which will be entered into your electronic medical record.  These signatures attest to the fact that that the information above on your After Visit Summary has been reviewed and is understood.  Full responsibility of the confidentiality of this discharge information lies with you and/or your care-partner.   Handouts were given to your care partner on polyps, hemorrhoids, and a high fiber diet with liberal fluid intake. You may resume your   current medications today. Await biopsy results. Please call if any questions or concerns.   

## 2015-09-10 NOTE — Progress Notes (Signed)
Report to PACU, RN, vss, BBS= Clear.  

## 2015-09-10 NOTE — Progress Notes (Signed)
Called to room to assist during endoscopic procedure.  Patient ID and intended procedure confirmed with present staff. Received instructions for my participation in the procedure from the performing physician.  

## 2015-09-11 ENCOUNTER — Telehealth: Payer: Self-pay | Admitting: *Deleted

## 2015-09-11 NOTE — Telephone Encounter (Signed)
No answer. Left message to call if questions or concerns. 

## 2015-09-17 ENCOUNTER — Encounter: Payer: Self-pay | Admitting: Gastroenterology

## 2015-09-24 ENCOUNTER — Ambulatory Visit: Payer: Self-pay | Admitting: Physician Assistant

## 2015-09-29 ENCOUNTER — Ambulatory Visit: Payer: Self-pay | Admitting: Physician Assistant

## 2015-10-06 ENCOUNTER — Encounter: Payer: Self-pay | Admitting: Physician Assistant

## 2015-10-06 ENCOUNTER — Ambulatory Visit (INDEPENDENT_AMBULATORY_CARE_PROVIDER_SITE_OTHER): Payer: Commercial Managed Care - HMO | Admitting: Physician Assistant

## 2015-10-06 VITALS — BP 130/80 | HR 60 | Temp 97.3°F | Resp 14 | Ht 64.0 in | Wt 188.0 lb

## 2015-10-06 DIAGNOSIS — Z0000 Encounter for general adult medical examination without abnormal findings: Secondary | ICD-10-CM | POA: Insufficient documentation

## 2015-10-06 DIAGNOSIS — E559 Vitamin D deficiency, unspecified: Secondary | ICD-10-CM | POA: Diagnosis not present

## 2015-10-06 DIAGNOSIS — E785 Hyperlipidemia, unspecified: Secondary | ICD-10-CM

## 2015-10-06 DIAGNOSIS — N040 Nephrotic syndrome with minor glomerular abnormality: Secondary | ICD-10-CM | POA: Diagnosis not present

## 2015-10-06 DIAGNOSIS — I10 Essential (primary) hypertension: Secondary | ICD-10-CM

## 2015-10-06 DIAGNOSIS — Z79899 Other long term (current) drug therapy: Secondary | ICD-10-CM

## 2015-10-06 DIAGNOSIS — R7303 Prediabetes: Secondary | ICD-10-CM

## 2015-10-06 DIAGNOSIS — Z1389 Encounter for screening for other disorder: Secondary | ICD-10-CM | POA: Diagnosis not present

## 2015-10-06 DIAGNOSIS — Z1331 Encounter for screening for depression: Secondary | ICD-10-CM

## 2015-10-06 LAB — HEPATIC FUNCTION PANEL
ALBUMIN: 3.7 g/dL (ref 3.6–5.1)
ALT: 12 U/L (ref 6–29)
AST: 16 U/L (ref 10–35)
Alkaline Phosphatase: 54 U/L (ref 33–130)
BILIRUBIN DIRECT: 0.1 mg/dL (ref <=0.2)
Indirect Bilirubin: 0.4 mg/dL (ref 0.2–1.2)
Total Bilirubin: 0.5 mg/dL (ref 0.2–1.2)
Total Protein: 6.6 g/dL (ref 6.1–8.1)

## 2015-10-06 LAB — CBC WITH DIFFERENTIAL/PLATELET
BASOS PCT: 1 % (ref 0–1)
Basophils Absolute: 0.1 10*3/uL (ref 0.0–0.1)
EOS ABS: 0.2 10*3/uL (ref 0.0–0.7)
Eosinophils Relative: 3 % (ref 0–5)
HCT: 38.1 % (ref 36.0–46.0)
HEMOGLOBIN: 12.4 g/dL (ref 12.0–15.0)
Lymphocytes Relative: 35 % (ref 12–46)
Lymphs Abs: 2.1 10*3/uL (ref 0.7–4.0)
MCH: 27.1 pg (ref 26.0–34.0)
MCHC: 32.5 g/dL (ref 30.0–36.0)
MCV: 83.2 fL (ref 78.0–100.0)
MONOS PCT: 8 % (ref 3–12)
MPV: 9.2 fL (ref 8.6–12.4)
Monocytes Absolute: 0.5 10*3/uL (ref 0.1–1.0)
Neutro Abs: 3.2 10*3/uL (ref 1.7–7.7)
Neutrophils Relative %: 53 % (ref 43–77)
PLATELETS: 241 10*3/uL (ref 150–400)
RBC: 4.58 MIL/uL (ref 3.87–5.11)
RDW: 14.4 % (ref 11.5–15.5)
WBC: 6 10*3/uL (ref 4.0–10.5)

## 2015-10-06 LAB — BASIC METABOLIC PANEL WITH GFR
BUN: 11 mg/dL (ref 7–25)
CALCIUM: 8.6 mg/dL (ref 8.6–10.4)
CO2: 29 mmol/L (ref 20–31)
CREATININE: 0.78 mg/dL (ref 0.50–0.99)
Chloride: 104 mmol/L (ref 98–110)
GFR, Est African American: >89 mL/min (ref >=60)
GFR, Est Non African American: 80 mL/min (ref >=60)
Glucose, Bld: 97 mg/dL (ref 65–99)
Potassium: 3.8 mmol/L (ref 3.5–5.3)
SODIUM: 142 mmol/L (ref 135–146)

## 2015-10-06 LAB — LIPID PANEL
CHOLESTEROL: 183 mg/dL (ref 125–200)
HDL: 44 mg/dL — ABNORMAL LOW (ref >=46)
LDL Cholesterol: 125 mg/dL (ref <130)
Total CHOL/HDL Ratio: 4.2 Ratio (ref <=5.0)
Triglycerides: 68 mg/dL (ref <150)
VLDL: 14 mg/dL (ref <30)

## 2015-10-06 LAB — TSH: TSH: 1.42 u[IU]/mL (ref 0.350–4.500)

## 2015-10-06 LAB — HEMOGLOBIN A1C
Hgb A1c MFr Bld: 6.1 % — ABNORMAL HIGH (ref <5.7)
MEAN PLASMA GLUCOSE: 128 mg/dL — AB (ref <117)

## 2015-10-06 LAB — MAGNESIUM: MAGNESIUM: 1.8 mg/dL (ref 1.5–2.5)

## 2015-10-06 MED ORDER — HYDROCHLOROTHIAZIDE 25 MG PO TABS: 25.0000 mg | ORAL_TABLET | Freq: Every day | ORAL | Status: DC

## 2015-10-06 NOTE — Patient Instructions (Signed)
We want weight loss that will last so you should lose 1-2 pounds a week.  THAT IS IT! Please pick THREE things a month to change. Once it is a habit check off the item. Then pick another three items off the list to become habits.  If you are already doing a habit on the list GREAT!  Cross that item off! o Don't drink your calories. Ie, alcohol, soda, fruit juice, and sweet tea.  o Drink more water. Drink a glass when you feel hungry or before each meal.  o Eat breakfast - Complex carb and protein (likeDannon light and fit yogurt, oatmeal, fruit, eggs, turkey bacon). o Measure your cereal.  Eat no more than one cup a day. (ie Kashi) o Eat an apple a day. o Add a vegetable a day. o Try a new vegetable a month. o Use Pam! Stop using oil or butter to cook. o Don't finish your plate or use smaller plates. o Share your dessert. o Eat sugar free Jello for dessert or frozen grapes. o Don't eat 2-3 hours before bed. o Switch to whole wheat bread, pasta, and brown rice. o Make healthier choices when you eat out. No fries! o Pick baked chicken, NOT fried. o Don't forget to SLOW DOWN when you eat. It is not going anywhere.  o Take the stairs. o Park far away in the parking lot o Lift soup cans (or weights) for 10 minutes while watching TV. o Walk at work for 10 minutes during break. o Walk outside 1 time a week with your friend, kids, dog, or significant other. o Start a walking group at church. o Walk the mall as much as you can tolerate.  o Keep a food diary. o Weigh yourself daily. o Walk for 15 minutes 3 days per week. o Cook at home more often and eat out less.  If life happens and you go back to old habits, it is okay.  Just start over. You can do it!   If you experience chest pain, get short of breath, or tired during the exercise, please stop immediately and inform your doctor.      Bad carbs also include fruit juice, alcohol, and sweet tea. These are empty calories that do not signal  to your brain that you are full.   Please remember the good carbs are still carbs which convert into sugar. So please measure them out no more than 1/2-1 cup of rice, oatmeal, pasta, and beans  Veggies are however free foods! Pile them on.   Not all fruit is created equal. Please see the list below, the fruit at the bottom is higher in sugars than the fruit at the top. Please avoid all dried fruits.     

## 2015-10-06 NOTE — Progress Notes (Signed)
Assessment and Plan:  1. Hypertension -Continue medication, monitor blood pressure at home. Continue DASH diet.  Reminder to go to the ER if any CP, SOB, nausea, dizziness, severe HA, changes vision/speech, left arm numbness and tingling and jaw pain.  2. Cholesterol -Continue diet and exercise. Check cholesterol.   3. Prediabetes  -Continue diet and exercise. Check A1C  4. Vitamin D Def - check level and continue medications.   5. Depression screen negative  6. Obesity with co morbidities - long discussion about weight loss, diet, and exercise   Continue diet and meds as discussed. Further disposition pending results of labs. Over 30 minutes of exam, counseling, chart review, and critical decision making was performed  HPI 64 y.o. female  presents for 3 month follow up on hypertension, cholesterol, prediabetes, and vitamin D deficiency.   Her blood pressure has been controlled at home, today their BP is BP: 130/80 mmHg  She does workout. She denies chest pain, shortness of breath, dizziness.  She is on cholesterol medication and denies myalgias. Her cholesterol is at goal. The cholesterol last visit was:   Lab Results  Component Value Date   CHOL 169 06/16/2015   HDL 42* 06/16/2015   LDLCALC 111* 06/16/2015   LDLDIRECT 160.1 07/18/2009   TRIG 80 06/16/2015   CHOLHDL 4.0 06/16/2015    She has been working on diet and exercise for prediabetes, and denies paresthesia of the feet, polydipsia, polyuria and visual disturbances. Last A1C in the office was:  Lab Results  Component Value Date   HGBA1C 6.3* 06/16/2015   She has nephrotic syndrome, on ACE, being monitored.  Lab Results  Component Value Date   GFRAA 79 06/16/2015   Patient is on Vitamin D supplement.   Lab Results  Component Value Date   VD25OH 41 06/16/2015     BMI is Body mass index is 32.25 kg/(m^2)., she is working on diet and exercise. Wt Readings from Last 3 Encounters:  10/06/15 188 lb (85.276 kg)   09/10/15 185 lb (83.915 kg)  08/27/15 185 lb (83.915 kg)   Current Medications:  Current Outpatient Prescriptions on File Prior to Visit  Medication Sig Dispense Refill  . acetaminophen (TYLENOL) 500 MG tablet Take 500 mg by mouth every 6 (six) hours as needed.    Marland Kitchen atorvastatin (LIPITOR) 40 MG tablet Take 1 tablet (40 mg total) by mouth daily. 30 tablet 3  . benazepril (LOTENSIN) 40 MG tablet Take 1 tablet (40 mg total) by mouth daily. To last til next appointment 30 tablet 3   No current facility-administered medications on file prior to visit.   Medical History:  Past Medical History  Diagnosis Date  . Hyperlipidemia   . Hypertension   . Vitamin D deficiency   . Prediabetes    Allergies:  Allergies  Allergen Reactions  . Naproxen     REACTION: renal failure  . Nsaids      Review of Systems:  Review of Systems  Constitutional: Negative for fever, chills, weight loss and malaise/fatigue.  HENT: Negative for congestion, ear pain and sore throat.   Eyes: Negative.   Respiratory: Negative for cough, shortness of breath and wheezing.   Cardiovascular: Negative for chest pain, palpitations and leg swelling.  Gastrointestinal: Negative for heartburn, diarrhea, constipation, blood in stool and melena.  Genitourinary: Negative for dysuria, urgency, frequency and hematuria.  Musculoskeletal: Positive for joint pain (right shoulder). Negative for myalgias, back pain, falls and neck pain.  Skin: Negative.   Neurological:  Negative for dizziness, sensory change, loss of consciousness and headaches.  Psychiatric/Behavioral: Negative for depression. The patient is not nervous/anxious and does not have insomnia.     Family history- Review and unchanged Social history- Review and unchanged Physical Exam: BP 130/80 mmHg  Pulse 60  Temp(Src) 97.3 F (36.3 C) (Temporal)  Resp 14  Ht 5\' 4"  (1.626 m)  Wt 188 lb (85.276 kg)  BMI 32.25 kg/m2  SpO2 98% Wt Readings from Last 3  Encounters:  10/06/15 188 lb (85.276 kg)  09/10/15 185 lb (83.915 kg)  08/27/15 185 lb (83.915 kg)   General Appearance: Well nourished, in no apparent distress. Eyes: PERRLA, EOMs, conjunctiva no swelling or erythema Sinuses: No Frontal/maxillary tenderness ENT/Mouth: Ext aud canals clear, TMs without erythema, bulging. No erythema, swelling, or exudate on post pharynx.  Tonsils not swollen or erythematous. Hearing normal.  Neck: Supple, thyroid normal.  Respiratory: Respiratory effort normal, BS equal bilaterally without rales, rhonchi, wheezing or stridor.  Cardio: RRR with no MRGs. Brisk peripheral pulses without edema.  Abdomen: Soft, + BS,  Non tender, no guarding, rebound, hernias, masses. Lymphatics: Non tender without lymphadenopathy.  Musculoskeletal: Full ROM, 5/5 strength, Normal gait Skin: Warm, dry without rashes, lesions, ecchymosis.  Neuro: Cranial nerves intact. Normal muscle tone, no cerebellar symptoms. Psych: Awake and oriented X 3, normal affect, Insight and Judgment appropriate.    Vicie Mutters, PA-C 9:43 AM [facility]

## 2015-10-07 ENCOUNTER — Encounter: Payer: Self-pay | Admitting: Internal Medicine

## 2015-10-27 ENCOUNTER — Ambulatory Visit (INDEPENDENT_AMBULATORY_CARE_PROVIDER_SITE_OTHER): Payer: Commercial Managed Care - HMO | Admitting: Physician Assistant

## 2015-10-27 ENCOUNTER — Encounter: Payer: Self-pay | Admitting: Physician Assistant

## 2015-10-27 VITALS — BP 118/70 | HR 73 | Temp 97.9°F | Resp 16 | Ht 64.0 in | Wt 188.0 lb

## 2015-10-27 DIAGNOSIS — R112 Nausea with vomiting, unspecified: Secondary | ICD-10-CM

## 2015-10-27 DIAGNOSIS — R1013 Epigastric pain: Secondary | ICD-10-CM

## 2015-10-27 DIAGNOSIS — R300 Dysuria: Secondary | ICD-10-CM | POA: Diagnosis not present

## 2015-10-27 LAB — HEPATIC FUNCTION PANEL
ALBUMIN: 2.5 g/dL — AB (ref 3.6–5.1)
ALK PHOS: 69 U/L (ref 33–130)
ALT: 14 U/L (ref 6–29)
AST: 21 U/L (ref 10–35)
Bilirubin, Direct: <0.1 mg/dL (ref <=0.2)
TOTAL PROTEIN: 5.5 g/dL — AB (ref 6.1–8.1)
Total Bilirubin: 0.3 mg/dL (ref 0.2–1.2)

## 2015-10-27 LAB — BASIC METABOLIC PANEL WITH GFR
BUN: 59 mg/dL — AB (ref 7–25)
CHLORIDE: 100 mmol/L (ref 98–110)
CO2: 31 mmol/L (ref 20–31)
CREATININE: 4.92 mg/dL — AB (ref 0.50–0.99)
Calcium: 8 mg/dL — ABNORMAL LOW (ref 8.6–10.4)
GFR, Est African American: 10 mL/min — ABNORMAL LOW (ref >=60)
GFR, Est Non African American: 9 mL/min — ABNORMAL LOW (ref >=60)
GLUCOSE: 96 mg/dL (ref 65–99)
POTASSIUM: 3.9 mmol/L (ref 3.5–5.3)
Sodium: 139 mmol/L (ref 135–146)

## 2015-10-27 LAB — CBC WITH DIFFERENTIAL/PLATELET
BASOS ABS: 0.1 10*3/uL (ref 0.0–0.1)
Basophils Relative: 1 % (ref 0–1)
Eosinophils Absolute: 0.9 10*3/uL — ABNORMAL HIGH (ref 0.0–0.7)
Eosinophils Relative: 10 % — ABNORMAL HIGH (ref 0–5)
HEMATOCRIT: 36.9 % (ref 36.0–46.0)
HEMOGLOBIN: 12.4 g/dL (ref 12.0–15.0)
LYMPHS ABS: 1.3 10*3/uL (ref 0.7–4.0)
LYMPHS PCT: 14 % (ref 12–46)
MCH: 27.4 pg (ref 26.0–34.0)
MCHC: 33.6 g/dL (ref 30.0–36.0)
MCV: 81.6 fL (ref 78.0–100.0)
MPV: 9 fL (ref 8.6–12.4)
Monocytes Absolute: 1.5 10*3/uL — ABNORMAL HIGH (ref 0.1–1.0)
Monocytes Relative: 16 % — ABNORMAL HIGH (ref 3–12)
NEUTROS PCT: 59 % (ref 43–77)
Neutro Abs: 5.5 10*3/uL (ref 1.7–7.7)
Platelets: 265 10*3/uL (ref 150–400)
RBC: 4.52 MIL/uL (ref 3.87–5.11)
RDW: 14.1 % (ref 11.5–15.5)
WBC: 9.3 10*3/uL (ref 4.0–10.5)

## 2015-10-27 LAB — AMYLASE: AMYLASE: 93 U/L (ref 0–105)

## 2015-10-27 MED ORDER — PROMETHAZINE HCL 25 MG PO TABS: 25.0000 mg | ORAL_TABLET | Freq: Four times a day (QID) | ORAL | Status: DC | PRN

## 2015-10-27 MED ORDER — AZITHROMYCIN 250 MG PO TABS: ORAL_TABLET | ORAL | Status: DC

## 2015-10-27 NOTE — Patient Instructions (Addendum)
Take zpak for cough, take nausea pill 1/2-1 as needed, it can make you sleepy.   Please stop the HCTZ/fluid pill until you are feeling better and able to eat/drink more. Monitor your bp   If worse go to the ER.    Gastritis, Adult Gastritis is soreness and swelling (inflammation) of the lining of the stomach. Gastritis can develop as a sudden onset (acute) or long-term (chronic) condition. If gastritis is not treated, it can lead to stomach bleeding and ulcers. CAUSES  Gastritis occurs when the stomach lining is weak or damaged. Digestive juices from the stomach then inflame the weakened stomach lining. The stomach lining may be weak or damaged due to viral or bacterial infections. One common bacterial infection is the Helicobacter pylori infection. Gastritis can also result from excessive alcohol consumption, taking certain medicines, or having too much acid in the stomach.  SYMPTOMS  In some cases, there are no symptoms. When symptoms are present, they may include:  Pain or a burning sensation in the upper abdomen.  Nausea.  Vomiting.  An uncomfortable feeling of fullness after eating. DIAGNOSIS  Your caregiver may suspect you have gastritis based on your symptoms and a physical exam. To determine the cause of your gastritis, your caregiver may perform the following:  Blood or stool tests to check for the H pylori bacterium.  Gastroscopy. A thin, flexible tube (endoscope) is passed down the esophagus and into the stomach. The endoscope has a light and camera on the end. Your caregiver uses the endoscope to view the inside of the stomach.  Taking a tissue sample (biopsy) from the stomach to examine under a microscope. TREATMENT  Depending on the cause of your gastritis, medicines may be prescribed. If you have a bacterial infection, such as an H pylori infection, antibiotics may be given. If your gastritis is caused by too much acid in the stomach, H2 blockers or antacids may be  given. Your caregiver may recommend that you stop taking aspirin, ibuprofen, or other nonsteroidal anti-inflammatory drugs (NSAIDs). HOME CARE INSTRUCTIONS  Only take over-the-counter or prescription medicines as directed by your caregiver.  If you were given antibiotic medicines, take them as directed. Finish them even if you start to feel better.  Drink enough fluids to keep your urine clear or pale yellow.  Avoid foods and drinks that make your symptoms worse, such as:  Caffeine or alcoholic drinks.  Chocolate.  Peppermint or mint flavorings.  Garlic and onions.  Spicy foods.  Citrus fruits, such as oranges, lemons, or limes.  Tomato-based foods such as sauce, chili, salsa, and pizza.  Fried and fatty foods.  Eat small, frequent meals instead of large meals. SEEK IMMEDIATE MEDICAL CARE IF:   You have black or dark red stools.  You vomit blood or material that looks like coffee grounds.  You are unable to keep fluids down.  Your abdominal pain gets worse.  You have a fever.  You do not feel better after 1 week.  You have any other questions or concerns. MAKE SURE YOU:  Understand these instructions.  Will watch your condition.  Will get help right away if you are not doing well or get worse.   This information is not intended to replace advice given to you by your health care provider. Make sure you discuss any questions you have with your health care provider.   Document Released: 11/09/2001 Document Revised: 05/16/2012 Document Reviewed: 12/29/2011 Elsevier Interactive Patient Education [employer].

## 2015-10-27 NOTE — Progress Notes (Signed)
Subjective:    Patient ID: Lydia Edwards, female    DOB: 02-Oct-1951, 64 y.o.   MRN: [id number]  HPI 64 y.o. non smoking AAF with history of HTN, chol presents with fever and cough X 5 days. On thanksgiving she work up feeling bad, has had fever, chills, cough/dry heaving with vomiting occ,  has been able to eat soup and drinking fluids. Has not vomited since Saturday but continues to have dry heaving, nausea. Has cough with white mucus. Denies diarrhea, constipation, has some burning when she pees and will be low volume.   Blood pressure 118/70, pulse 73, temperature 97.9 F (36.6 C), temperature source Temporal, resp. rate 16, height 5\' 4"  (1.626 m), weight 188 lb (85.276 kg), SpO2 98 %.  Current Outpatient Prescriptions on File Prior to Visit  Medication Sig Dispense Refill  . acetaminophen (TYLENOL) 500 MG tablet Take 500 mg by mouth every 6 (six) hours as needed.    Marland Kitchen atorvastatin (LIPITOR) 40 MG tablet Take 1 tablet (40 mg total) by mouth daily. 30 tablet 3  . hydrochlorothiazide (HYDRODIURIL) 25 MG tablet Take 1 tablet (25 mg total) by mouth daily. 90 tablet 3  . benazepril (LOTENSIN) 40 MG tablet Take 1 tablet (40 mg total) by mouth daily. To last til next appointment 30 tablet 3   No current facility-administered medications on file prior to visit.   Past Medical History  Diagnosis Date  . Hyperlipidemia   . Hypertension   . Vitamin D deficiency   . Prediabetes     Review of Systems  Constitutional: Positive for chills and fatigue. Negative for fever, diaphoresis, activity change, appetite change and unexpected weight change.  HENT: Negative.   Eyes: Negative.   Respiratory: Positive for cough and wheezing. Negative for shortness of breath.   Cardiovascular: Negative.  Negative for chest pain.  Gastrointestinal: Positive for nausea and vomiting. Negative for abdominal pain, diarrhea, constipation, blood in stool, abdominal distention, anal bleeding and rectal pain.   Genitourinary: Negative.   Musculoskeletal: Negative.   Neurological: Negative.   Psychiatric/Behavioral: Negative.        Objective:   Physical Exam  Constitutional: She is oriented to person, place, and time. She appears well-developed and well-nourished.  HENT:  Head: Normocephalic and atraumatic.  Right Ear: External ear normal.  Left Ear: External ear normal.  Mouth/Throat: Oropharynx is clear and moist.  Eyes: Conjunctivae and EOM are normal. Pupils are equal, round, and reactive to light.  Neck: Normal range of motion. Neck supple. No thyromegaly present.  Cardiovascular: Normal rate, regular rhythm and normal heart sounds.  Exam reveals no gallop and no friction rub.   No murmur heard. Pulmonary/Chest: Effort normal. No respiratory distress. She has no decreased breath sounds. She has wheezes in the right lower field. She has no rhonchi. She has no rales.  Abdominal: Soft. Bowel sounds are normal. She exhibits no shifting dullness, no distension, no abdominal bruit, no pulsatile midline mass and no mass. There is no hepatosplenomegaly. There is tenderness in the epigastric area. There is no rigidity, no rebound, no guarding, no CVA tenderness, no tenderness at McBurney's point and negative Murphy's sign. No hernia.  Musculoskeletal: Normal range of motion.  Lymphadenopathy:    She has no cervical adenopathy.  Neurological: She is alert and oriented to person, place, and time.  Skin: Skin is warm and dry.  Psychiatric: She has a normal mood and affect.      Assessment & Plan:  Nausea with vomiting,  cough ? Viral syndrome- no red flags, normal vitals Phenergan, bland diet, zpak for wheezing RLQ, check labs  Nexium samples given.  Follow up if not better or go to ER if worse

## 2015-10-28 ENCOUNTER — Observation Stay (HOSPITAL_COMMUNITY): Payer: Commercial Managed Care - HMO

## 2015-10-28 ENCOUNTER — Encounter (HOSPITAL_COMMUNITY): Payer: Self-pay | Admitting: *Deleted

## 2015-10-28 ENCOUNTER — Other Ambulatory Visit: Payer: Self-pay | Admitting: Internal Medicine

## 2015-10-28 ENCOUNTER — Inpatient Hospital Stay (HOSPITAL_COMMUNITY)
Admission: EM | Admit: 2015-10-28 | Discharge: 2015-11-13 | DRG: 682 | Disposition: A | Discharge status: Home / Self Care | Payer: Commercial Managed Care - HMO | Attending: Internal Medicine | Admitting: Internal Medicine

## 2015-10-28 DIAGNOSIS — N170 Acute kidney failure with tubular necrosis: Principal | ICD-10-CM | POA: Diagnosis present

## 2015-10-28 DIAGNOSIS — E860 Dehydration: Secondary | ICD-10-CM | POA: Diagnosis present

## 2015-10-28 DIAGNOSIS — N179 Acute kidney failure, unspecified: Secondary | ICD-10-CM | POA: Diagnosis not present

## 2015-10-28 DIAGNOSIS — R059 Cough, unspecified: Secondary | ICD-10-CM

## 2015-10-28 DIAGNOSIS — I10 Essential (primary) hypertension: Secondary | ICD-10-CM | POA: Diagnosis present

## 2015-10-28 DIAGNOSIS — K219 Gastro-esophageal reflux disease without esophagitis: Secondary | ICD-10-CM | POA: Diagnosis present

## 2015-10-28 DIAGNOSIS — R7303 Prediabetes: Secondary | ICD-10-CM | POA: Diagnosis present

## 2015-10-28 DIAGNOSIS — Z79899 Other long term (current) drug therapy: Secondary | ICD-10-CM

## 2015-10-28 DIAGNOSIS — R05 Cough: Secondary | ICD-10-CM

## 2015-10-28 DIAGNOSIS — R34 Anuria and oliguria: Secondary | ICD-10-CM | POA: Diagnosis present

## 2015-10-28 DIAGNOSIS — J189 Pneumonia, unspecified organism: Secondary | ICD-10-CM | POA: Diagnosis present

## 2015-10-28 DIAGNOSIS — R112 Nausea with vomiting, unspecified: Secondary | ICD-10-CM | POA: Diagnosis present

## 2015-10-28 DIAGNOSIS — J209 Acute bronchitis, unspecified: Secondary | ICD-10-CM | POA: Diagnosis present

## 2015-10-28 DIAGNOSIS — Z886 Allergy status to analgesic agent status: Secondary | ICD-10-CM

## 2015-10-28 DIAGNOSIS — E785 Hyperlipidemia, unspecified: Secondary | ICD-10-CM | POA: Diagnosis present

## 2015-10-28 DIAGNOSIS — E8770 Fluid overload, unspecified: Secondary | ICD-10-CM | POA: Diagnosis present

## 2015-10-28 DIAGNOSIS — Z09 Encounter for follow-up examination after completed treatment for conditions other than malignant neoplasm: Secondary | ICD-10-CM

## 2015-10-28 DIAGNOSIS — R630 Anorexia: Secondary | ICD-10-CM | POA: Diagnosis present

## 2015-10-28 DIAGNOSIS — E559 Vitamin D deficiency, unspecified: Secondary | ICD-10-CM | POA: Diagnosis present

## 2015-10-28 HISTORY — DX: Acute kidney failure, unspecified: N17.9

## 2015-10-28 LAB — BASIC METABOLIC PANEL
Anion gap: 8 (ref 5–15)
Anion gap: 10 (ref 5–15)
BUN: 61 mg/dL — AB (ref 6–20)
BUN: 58 mg/dL — ABNORMAL HIGH (ref 6–20)
CALCIUM: 7.6 mg/dL — AB (ref 8.9–10.3)
CHLORIDE: 102 mmol/L (ref 101–111)
CO2: 28 mmol/L (ref 22–32)
CO2: 26 mmol/L (ref 22–32)
CREATININE: 5.93 mg/dL — AB (ref 0.44–1.00)
Calcium: 7.8 mg/dL — ABNORMAL LOW (ref 8.9–10.3)
Chloride: 103 mmol/L (ref 101–111)
Creatinine, Ser: 5.74 mg/dL — ABNORMAL HIGH (ref 0.44–1.00)
GFR calc Af Amer: 8 mL/min — ABNORMAL LOW (ref >60)
GFR calc non Af Amer: 7 mL/min — ABNORMAL LOW (ref >60)
GFR calc non Af Amer: 7 mL/min — ABNORMAL LOW (ref >60)
GFR, EST AFRICAN AMERICAN: 8 mL/min — AB (ref >60)
GLUCOSE: 121 mg/dL — AB (ref 65–99)
GLUCOSE: 92 mg/dL (ref 65–99)
POTASSIUM: 3.5 mmol/L (ref 3.5–5.1)
Potassium: 4.5 mmol/L (ref 3.5–5.1)
SODIUM: 138 mmol/L (ref 135–145)
Sodium: 139 mmol/L (ref 135–145)

## 2015-10-28 LAB — URINALYSIS, ROUTINE W REFLEX MICROSCOPIC
Glucose, UA: NEGATIVE mg/dL
Ketones, ur: 15 mg/dL — AB
Leukocytes, UA: NEGATIVE
Nitrite: NEGATIVE
PH: 5.5 (ref 5.0–8.0)
Protein, ur: >300 mg/dL — AB

## 2015-10-28 LAB — CBC WITH DIFFERENTIAL/PLATELET
BASOS PCT: 2 %
Basophils Absolute: 0.2 10*3/uL — ABNORMAL HIGH (ref 0.0–0.1)
EOS PCT: 8 %
Eosinophils Absolute: 0.8 10*3/uL — ABNORMAL HIGH (ref 0.0–0.7)
HEMATOCRIT: 35.1 % — AB (ref 36.0–46.0)
HEMOGLOBIN: 11.6 g/dL — AB (ref 12.0–15.0)
LYMPHS PCT: 24 %
Lymphs Abs: 2.3 10*3/uL (ref 0.7–4.0)
MCH: 27 pg (ref 26.0–34.0)
MCHC: 33 g/dL (ref 30.0–36.0)
MCV: 81.6 fL (ref 78.0–100.0)
MONOS PCT: 8 %
Monocytes Absolute: 0.8 10*3/uL (ref 0.1–1.0)
NEUTROS ABS: 5.4 10*3/uL (ref 1.7–7.7)
NEUTROS PCT: 58 %
Platelets: 263 10*3/uL (ref 150–400)
RBC: 4.3 MIL/uL (ref 3.87–5.11)
RDW: 14 % (ref 11.5–15.5)
WBC: 9.5 10*3/uL (ref 4.0–10.5)

## 2015-10-28 LAB — URINE CULTURE: Colony Count: 30000

## 2015-10-28 LAB — URINE MICROSCOPIC-ADD ON

## 2015-10-28 MED ORDER — SODIUM CHLORIDE 0.9 % IV SOLN
INTRAVENOUS | Status: AC
  Administered 2015-10-28: 17:00:00 via INTRAVENOUS

## 2015-10-28 MED ORDER — ONDANSETRON HCL 4 MG/2ML IJ SOLN
4.0000 mg | Freq: Four times a day (QID) | INTRAMUSCULAR | Status: DC | PRN
  Administered 2015-10-31 – 2015-11-09 (×15): 4 mg via INTRAVENOUS
  Filled 2015-10-28 (×15): qty 2

## 2015-10-28 MED ORDER — PNEUMOCOCCAL VAC POLYVALENT 25 MCG/0.5ML IJ INJ
0.5000 mL | INJECTION | INTRAMUSCULAR | Status: AC
  Administered 2015-10-29: 0.5 mL via INTRAMUSCULAR
  Filled 2015-10-28: qty 0.5

## 2015-10-28 MED ORDER — SODIUM CHLORIDE 0.9 % IV BOLUS (SEPSIS)
1000.0000 mL | Freq: Once | INTRAVENOUS | Status: AC
Start: 2015-10-28 — End: 2015-10-28
  Administered 2015-10-28: 1000 mL via INTRAVENOUS

## 2015-10-28 MED ORDER — ACETAMINOPHEN 325 MG PO TABS
650.0000 mg | ORAL_TABLET | Freq: Once | ORAL | Status: AC
  Administered 2015-10-28: 650 mg via ORAL
  Filled 2015-10-28: qty 2

## 2015-10-28 MED ORDER — ONDANSETRON 4 MG PO TBDP
4.0000 mg | ORAL_TABLET | Freq: Once | ORAL | Status: AC
  Administered 2015-10-28: 4 mg via ORAL
  Filled 2015-10-28: qty 1

## 2015-10-28 MED ORDER — ONDANSETRON HCL 4 MG/2ML IJ SOLN: 4.0000 mg | Freq: Three times a day (TID) | INTRAMUSCULAR | Status: DC | PRN

## 2015-10-28 MED ORDER — ACETAMINOPHEN 650 MG RE SUPP: 650.0000 mg | Freq: Four times a day (QID) | RECTAL | Status: DC | PRN

## 2015-10-28 MED ORDER — SODIUM CHLORIDE 0.9 % IV SOLN
INTRAVENOUS | Status: DC
  Administered 2015-10-28 – 2015-10-29 (×2): via INTRAVENOUS

## 2015-10-28 MED ORDER — HYDROCODONE-ACETAMINOPHEN 5-325 MG PO TABS: 1.0000 | ORAL_TABLET | ORAL | Status: DC | PRN

## 2015-10-28 MED ORDER — ENOXAPARIN SODIUM 30 MG/0.3ML ~~LOC~~ SOLN
30.0000 mg | SUBCUTANEOUS | Status: AC
  Administered 2015-10-28 – 2015-11-05 (×9): 30 mg via SUBCUTANEOUS
  Filled 2015-10-28 (×9): qty 0.3

## 2015-10-28 MED ORDER — BENZONATATE 100 MG PO CAPS
100.0000 mg | ORAL_CAPSULE | Freq: Two times a day (BID) | ORAL | Status: DC
  Administered 2015-10-28 – 2015-11-11 (×29): 100 mg via ORAL
  Filled 2015-10-28 (×29): qty 1

## 2015-10-28 MED ORDER — CEFTRIAXONE SODIUM 1 G IJ SOLR
1.0000 g | INTRAMUSCULAR | Status: DC
  Administered 2015-10-29 – 2015-10-31 (×3): 1 g via INTRAVENOUS
  Filled 2015-10-28 (×6): qty 10

## 2015-10-28 MED ORDER — ACETAMINOPHEN 325 MG PO TABS
650.0000 mg | ORAL_TABLET | Freq: Four times a day (QID) | ORAL | Status: DC | PRN
  Administered 2015-10-31 – 2015-11-10 (×2): 650 mg via ORAL
  Filled 2015-10-28 (×2): qty 2

## 2015-10-28 MED ORDER — AZITHROMYCIN 500 MG PO TABS
500.0000 mg | ORAL_TABLET | Freq: Every day | ORAL | Status: AC
  Administered 2015-10-28: 500 mg via ORAL
  Filled 2015-10-28: qty 1

## 2015-10-28 MED ORDER — ATORVASTATIN CALCIUM 40 MG PO TABS
40.0000 mg | ORAL_TABLET | Freq: Every day | ORAL | Status: DC
  Administered 2015-10-28 – 2015-11-13 (×16): 40 mg via ORAL
  Filled 2015-10-28 (×16): qty 1

## 2015-10-28 MED ORDER — ONDANSETRON HCL 4 MG PO TABS: 4.0000 mg | ORAL_TABLET | Freq: Four times a day (QID) | ORAL | Status: DC | PRN

## 2015-10-28 MED ORDER — AZITHROMYCIN 500 MG PO TABS
250.0000 mg | ORAL_TABLET | Freq: Every day | ORAL | Status: DC
  Administered 2015-10-29 – 2015-10-31 (×3): 250 mg via ORAL
  Filled 2015-10-28 (×3): qty 1

## 2015-10-28 NOTE — ED Notes (Signed)
Patient transported to X-ray 

## 2015-10-28 NOTE — Progress Notes (Signed)
CXR reviewed - LLL PNA. Will treat with Rocephin + Azithromycin

## 2015-10-28 NOTE — ED Notes (Signed)
Pt states she has been sick since Thanksgiving with v/d x 3 days.  Pt states she went to her MD and had labs drawn and was called this AM to come to ER with abnormal lab results.  Pt states she has not vomited in the last 24 hrs but feels dehydrated.

## 2015-10-28 NOTE — H&P (Signed)
[facility] History and Physical  Lydia Edwards [id number] DOB: [birth year] DOA: 10/28/2015  Referring physician: Emergency Department PCP: Alesia Richards, MD   CHIEF COMPLAINT:  nausea, vomiting, chills, cough    HPI: Lydia Edwards is a 64 y.o. female who was sent by PCP to ED for acute renal failure. Patient saw PCP yesterday for nausea, vomiting, diarrhea, chills and cough since Thanksgiving. PCP prescribed Zithromax, obtained labs    Patient has been coughing up phlegm. She believes the vomiting is really secondary to coughing. Stools are somewhat loose but only twice a day, no significant diarrhea. Patient has not checked her temp at home but does feel hot at times and having intermittent chills. Someone at work with sick but patient just felt she had a cold. Patient got flu shot in October   ED COURSE:    ome Liter bolus given Labs:   Sodium 138, potassium 3.5, BUN 61, creatinine 5.74. WBC 9.5, hemoglobin 11.6  Urinalysis:   Turbid, many bacteria, 15 ketones, negative leukocytes, negative nitrites, 0-5 WBCs, granular cast  CXR: will order    Medications  sodium chloride 0.9 % bolus 1,000 mL (0 mLs Intravenous Stopped 10/28/15 1021)  acetaminophen (TYLENOL) tablet 650 mg (650 mg Oral Given 10/28/15 0906)  ondansetron (ZOFRAN-ODT) disintegrating tablet 4 mg (4 mg Oral Given 10/28/15 0906)    Review of Systems  Constitutional: Positive for chills.  HENT: Positive for congestion.   Eyes: Negative.   Respiratory: Positive for cough.   Cardiovascular: Negative.   Gastrointestinal: Positive for vomiting.  Genitourinary: Negative.   Musculoskeletal: Negative.   Skin: Negative.   Neurological: Negative.   Endo/Heme/Allergies: Negative.   Psychiatric/Behavioral: Negative.     Past Medical History  Diagnosis Date  . Hyperlipidemia   . Hypertension   . Vitamin D deficiency   . Prediabetes    Past Surgical History  Procedure Laterality Date  .  Abdominal hysterectomy      SOCIAL HISTORY:  reports that she has never smoked. She has never used smokeless tobacco. She reports that she does not drink alcohol or use illicit drugs. Lives: At home with husband   Assistive devices:   None needed for ambulation.   Allergies  Allergen Reactions  . Naproxen     REACTION: renal failure  . Nsaids     Family History  Problem Relation Age of Onset  . Stroke Sister   . Hypertension Sister   . Cancer Sister   . Heart attack Brother   . Colon cancer Neg Hx     Prior to Admission medications   Medication Sig Start Date End Date Taking? Authorizing Provider  acetaminophen (TYLENOL) 500 MG tablet Take 500 mg by mouth every 6 (six) hours as needed for mild pain or headache.    Yes Historical Provider, MD  atorvastatin (LIPITOR) 40 MG tablet Take 1 tablet (40 mg total) by mouth daily. 02/03/15  Yes Courtney Forcucci, PA-C  azithromycin (ZITHROMAX) 250 MG tablet Take 2 tablets (500 mg) on  Day 1,  followed by 1 tablet (250 mg) once daily on Days 2 through 5. Patient taking differently: Take 250-500 mg by mouth daily. Take 2 tablets (500 mg) on  Day 1,  followed by 1 tablet (250 mg) once daily on Days 2 through 5. Started 10/27/15 10/27/15 11/01/15 Yes Vicie Mutters, PA-C  benazepril (LOTENSIN) 40 MG tablet Take 40 mg by mouth daily.   Yes Historical Provider, MD  Esomeprazole Magnesium (NEXIUM PO) Take 1 capsule  by mouth daily.   Yes Historical Provider, MD  hydrochlorothiazide (HYDRODIURIL) 25 MG tablet Take 1 tablet (25 mg total) by mouth daily. 10/06/15 05/12/16 Yes Vicie Mutters, PA-C  promethazine (PHENERGAN) 25 MG tablet Take 1 tablet (25 mg total) by mouth every 6 (six) hours as needed for nausea or vomiting. 10/27/15  Yes Vicie Mutters, PA-C  benazepril (LOTENSIN) 40 MG tablet Take 1 tablet (40 mg total) by mouth daily. To last til next appointment 02/03/15 09/10/15  Starlyn Skeans, PA-C   PHYSICAL EXAM: Filed Vitals:   10/28/15 0845  10/28/15 0915 10/28/15 0930 10/28/15 1000  BP: 124/69 139/71 139/73 132/71  Pulse: 60 56 65 49  Temp:      TempSrc:      Resp:    18  Height:      Weight:      SpO2: 98% 100% 100% 99%    Wt Readings from Last 3 Encounters:  10/28/15 85.276 kg (188 lb)  10/27/15 85.276 kg (188 lb)  10/06/15 85.276 kg (188 lb)    General:  Pleasant  black female. Appears calm and comfortable Eyes: PER, normal lids, irises & conjunctiva ENT: grossly normal hearing, lips & tongue Neck: no LAD, no masses Cardiovascular: RRR, no murmurs. No LE edema.  Respiratory: Respirations even and unlabored. Normal respiratory effort. Lungs CTA bilaterally, no wheezes / rales .   Abdomen: soft, non-distended, non-tender, active bowel sounds. No obvious masses.  Skin: no rash seen on limited exam Musculoskeletal: grossly normal tone BUE/BLE Psychiatric: grossly normal mood and affect, speech fluent and appropriate Neurologic: grossly non-focal.         LABS ON ADMISSION:    Basic Metabolic Panel:  Recent Labs Lab 10/27/15 1218 10/28/15 0901  NA 139 138  K 3.9 3.5  CL 100 102  CO2 31 28  GLUCOSE 96 121*  BUN 59* 61*  CREATININE 4.92* 5.74*  CALCIUM 8.0* 7.8*   Liver Function Tests:  Recent Labs Lab 10/27/15 1218  AST 21  ALT 14  ALKPHOS 69  BILITOT 0.3  PROT 5.5*  ALBUMIN 2.5*    Recent Labs Lab 10/27/15 1218  AMYLASE 93    CBC:  Recent Labs Lab 10/27/15 1218 10/28/15 0901  WBC 9.3 9.5  NEUTROABS 5.5 5.4  HGB 12.4 11.6*  HCT 36.9 35.1*  MCV 81.6 81.6  PLT 265 263    CREATININE: 5.74 mg/dL ABNORMAL (10/28/15 0901) Estimated creatinine clearance - 10.1 mL/min   ASSESSMENT / PLAN    Acute kidney injury. Creatinine 5.74, baseline 0.8. Suspect acute kidney injury secondary to volume depletion related to vomiting and poor by mouth intake. Electrolytes are normal. Hemodynamically stable.  -Admit to medical bed- observation -Fluid resuscitation -Hold ACE inhibitor's -Hold  on diuretic -Check renal function in a.m.  Cough productive of phlegm. Chills and subjective fever. Temp 99 now. Patient believes vomiting secondary to coughing.  -Obtain chest x-ray -? check for mono given atypical lymphocytes -continue anti-emetics prn -tessalon  Hypertension, stable.  -Hold home anti-hypertensive medications for now. -Hydralazine IV prn   GERD -IV PPI, change to PO when able to tolerate   CONSULTANTS:   none  Code Status: Full code DVT Prophylaxis: Lovenox Family Communication:   Patient alert, oriented and understands plan of care.   Disposition Plan: Discharge to home in 24-48 hours   Time spent: 60 minutes Tye Savoy  NP [facility] Pager [phone]

## 2015-10-28 NOTE — ED Provider Notes (Signed)
CSN: [id number]     Arrival date & time 10/28/15  0808 History   First MD Initiated Contact with Patient 10/28/15 [phone]     Chief Complaint  Patient presents with  . Dehydration   HPI   Lydia Edwards is an 64 y.o. female with history of HTN, HLD who presents to the ED for evaluation of dehydration. She reports she has been sick since Thanksgiving. States she has had a cough and congestion since thanksgiving. States she has also been nauseated with PO intake and had 1-2 episodes of NBNB emesis a day. States she has also had a couple episodes of watery non-bloody diarrhea a day since then as well. She states she has been able to keep fluids and soup down. Denies abdominal pain. Denies fever, chills, chest pain, SOB. She states she was seen by PCP yesterday who started her on a z-pack for RLL wheezing and supportive meds. However she was sent to the ED today for evaluation as her outpatient labs showed Cr of 4.92, BUN 59. SHe states she has had to be admitted for a similar presentation a few years ago. Denies history of CKD.  Past Medical History  Diagnosis Date  . Hyperlipidemia   . Hypertension   . Vitamin D deficiency   . Prediabetes    Past Surgical History  Procedure Laterality Date  . Abdominal hysterectomy     Family History  Problem Relation Age of Onset  . Stroke Sister   . Hypertension Sister   . Cancer Sister   . Heart attack Brother   . Colon cancer Neg Hx    Social History  Substance Use Topics  . Smoking status: Never Smoker   . Smokeless tobacco: Never Used  . Alcohol Use: No   OB History    No data available     Review of Systems  All other systems reviewed and are negative.     Allergies  Naproxen and Nsaids  Home Medications   Prior to Admission medications   Medication Sig Start Date End Date Taking? Authorizing Provider  acetaminophen (TYLENOL) 500 MG tablet Take 500 mg by mouth every 6 (six) hours as needed for mild pain or headache.    Yes Historical  Provider, MD  atorvastatin (LIPITOR) 40 MG tablet Take 1 tablet (40 mg total) by mouth daily. 02/03/15  Yes Courtney Forcucci, PA-C  azithromycin (ZITHROMAX) 250 MG tablet Take 2 tablets (500 mg) on  Day 1,  followed by 1 tablet (250 mg) once daily on Days 2 through 5. Patient taking differently: Take 250-500 mg by mouth daily. Take 2 tablets (500 mg) on  Day 1,  followed by 1 tablet (250 mg) once daily on Days 2 through 5. Started 10/27/15 10/27/15 11/01/15 Yes Vicie Mutters, PA-C  benazepril (LOTENSIN) 40 MG tablet Take 40 mg by mouth daily.   Yes Historical Provider, MD  Esomeprazole Magnesium (NEXIUM PO) Take 1 capsule by mouth daily.   Yes Historical Provider, MD  hydrochlorothiazide (HYDRODIURIL) 25 MG tablet Take 1 tablet (25 mg total) by mouth daily. 10/06/15 05/12/16 Yes Vicie Mutters, PA-C  promethazine (PHENERGAN) 25 MG tablet Take 1 tablet (25 mg total) by mouth every 6 (six) hours as needed for nausea or vomiting. 10/27/15  Yes Vicie Mutters, PA-C  benazepril (LOTENSIN) 40 MG tablet Take 1 tablet (40 mg total) by mouth daily. To last til next appointment 02/03/15 09/10/15  Loma Sousa Forcucci, PA-C   BP 139/71 mmHg  Pulse 56  Temp(Src) 99.1 F (  37.3 C) (Oral)  Resp 16  Ht 5\' 3"  (1.6 m)  Wt 85.276 kg  BMI 33.31 kg/m2  SpO2 100% Physical Exam  Constitutional: She is oriented to person, place, and time. No distress.  HENT:  Right Ear: External ear normal.  Left Ear: External ear normal.  Nose: Nose normal.  Mouth/Throat: Oropharynx is clear and moist. Mucous membranes are dry. No oropharyngeal exudate.  Eyes: Conjunctivae and EOM are normal. Pupils are equal, round, and reactive to light.  Neck: Normal range of motion. Neck supple.  Cardiovascular: Normal rate, regular rhythm, normal heart sounds and intact distal pulses.   Pulmonary/Chest: Effort normal and breath sounds normal. No respiratory distress. She has no wheezes.  Abdominal: Soft. Bowel sounds are normal. She exhibits no  distension. There is no tenderness.  Musculoskeletal: Normal range of motion. She exhibits no edema or tenderness.  Neurological: She is alert and oriented to person, place, and time. She has normal strength. No cranial nerve deficit or sensory deficit.  Skin: Skin is warm and dry. She is not diaphoretic. No pallor.  Psychiatric: She has a normal mood and affect.  Nursing note and vitals reviewed.   ED Course  Procedures (including critical care time) Labs Review Labs Reviewed  CBC WITH DIFFERENTIAL/PLATELET - Abnormal; Notable for the following:    Hemoglobin 11.6 (*)    HCT 35.1 (*)    Eosinophils Absolute 0.8 (*)    Basophils Absolute 0.2 (*)    All other components within normal limits  BASIC METABOLIC PANEL - Abnormal; Notable for the following:    Glucose, Bld 121 (*)    BUN 61 (*)    Creatinine, Ser 5.74 (*)    Calcium 7.8 (*)    GFR calc non Af Amer 7 (*)    GFR calc Af Amer 8 (*)    All other components within normal limits  URINALYSIS, ROUTINE W REFLEX MICROSCOPIC (NOT AT [facility]) - Abnormal; Notable for the following:    APPearance TURBID (*)    Specific Gravity, Urine >1.046 (*)    Hgb urine dipstick LARGE (*)    Bilirubin Urine SMALL (*)    Ketones, ur 15 (*)    Protein, ur >300 (*)    All other components within normal limits  URINE MICROSCOPIC-ADD ON - Abnormal; Notable for the following:    Squamous Epithelial / LPF 6-30 (*)    Bacteria, UA MANY (*)    Casts GRANULAR CAST (*)    All other components within normal limits    Imaging Review No results found. I have personally reviewed and evaluated these images and lab results as part of my medical decision-making.   EKG Interpretation None      MDM   Final diagnoses:  Acute kidney injury ([place])  Dehydration    Will re-check labs including cbc, bmp, and UA. Will give 1L NS bolus and zofran. If pt is truly in AKI will likely have to admit to hospitalist service.   Cr at 5.74, BUN 61. UA shows large  blood, ketones, protein. SG 1.046. Spoke to nephro for recs: d/c benazapril, fluids, obs. Will call hospitalist for admission.  Anne Ng, PA-C 10/28/15 [place], MD 10/30/15 [phone]

## 2015-10-29 ENCOUNTER — Inpatient Hospital Stay (HOSPITAL_COMMUNITY): Payer: Commercial Managed Care - HMO

## 2015-10-29 DIAGNOSIS — J209 Acute bronchitis, unspecified: Secondary | ICD-10-CM | POA: Diagnosis present

## 2015-10-29 DIAGNOSIS — K219 Gastro-esophageal reflux disease without esophagitis: Secondary | ICD-10-CM | POA: Diagnosis present

## 2015-10-29 DIAGNOSIS — R112 Nausea with vomiting, unspecified: Secondary | ICD-10-CM | POA: Diagnosis present

## 2015-10-29 DIAGNOSIS — Z886 Allergy status to analgesic agent status: Secondary | ICD-10-CM | POA: Diagnosis not present

## 2015-10-29 DIAGNOSIS — J189 Pneumonia, unspecified organism: Secondary | ICD-10-CM | POA: Diagnosis not present

## 2015-10-29 DIAGNOSIS — Z79899 Other long term (current) drug therapy: Secondary | ICD-10-CM | POA: Diagnosis not present

## 2015-10-29 DIAGNOSIS — E785 Hyperlipidemia, unspecified: Secondary | ICD-10-CM | POA: Diagnosis present

## 2015-10-29 DIAGNOSIS — R34 Anuria and oliguria: Secondary | ICD-10-CM | POA: Diagnosis present

## 2015-10-29 DIAGNOSIS — N170 Acute kidney failure with tubular necrosis: Secondary | ICD-10-CM | POA: Diagnosis present

## 2015-10-29 DIAGNOSIS — R7303 Prediabetes: Secondary | ICD-10-CM | POA: Diagnosis present

## 2015-10-29 DIAGNOSIS — R05 Cough: Secondary | ICD-10-CM | POA: Diagnosis present

## 2015-10-29 DIAGNOSIS — R630 Anorexia: Secondary | ICD-10-CM | POA: Diagnosis present

## 2015-10-29 DIAGNOSIS — E8770 Fluid overload, unspecified: Secondary | ICD-10-CM | POA: Diagnosis present

## 2015-10-29 DIAGNOSIS — E559 Vitamin D deficiency, unspecified: Secondary | ICD-10-CM | POA: Diagnosis present

## 2015-10-29 DIAGNOSIS — E860 Dehydration: Secondary | ICD-10-CM | POA: Diagnosis not present

## 2015-10-29 DIAGNOSIS — I10 Essential (primary) hypertension: Secondary | ICD-10-CM | POA: Diagnosis not present

## 2015-10-29 DIAGNOSIS — N179 Acute kidney failure, unspecified: Secondary | ICD-10-CM | POA: Diagnosis not present

## 2015-10-29 LAB — BASIC METABOLIC PANEL
ANION GAP: 11 (ref 5–15)
BUN: 63 mg/dL — ABNORMAL HIGH (ref 6–20)
CALCIUM: 7.5 mg/dL — AB (ref 8.9–10.3)
CHLORIDE: 104 mmol/L (ref 101–111)
CO2: 24 mmol/L (ref 22–32)
Creatinine, Ser: 6.44 mg/dL — ABNORMAL HIGH (ref 0.44–1.00)
GFR calc non Af Amer: 6 mL/min — ABNORMAL LOW (ref >60)
GFR, EST AFRICAN AMERICAN: 7 mL/min — AB (ref >60)
Glucose, Bld: 85 mg/dL (ref 65–99)
POTASSIUM: 3.9 mmol/L (ref 3.5–5.1)
Sodium: 139 mmol/L (ref 135–145)

## 2015-10-29 NOTE — Consult Note (Signed)
Lydia Edwards is a 64 y.o. female who was sent by PCP to ED for acute renal failure. Patient saw PCP for nausea, vomiting, diarrhea, chills and cough since Thanksgiving. Patient had been taking some Advil and daily Lotensin and Hydrodiuril for BP.  On 10/06/15 creat was 0.52m/dl. On 10/27/15 creat was 4.92 rising to 6.44 today. UA > 300 mg pro and 6-30 RBCs and 0-5 WBCs. Renal ultrasound revealed a 12.8cm R and 14.5 cm L echogenic kidneys. CXR reveals a LLL PNA.   Past Medical History  Diagnosis Date  . Hyperlipidemia   . Hypertension   . Vitamin D deficiency   . Prediabetes    Past Surgical History  Procedure Laterality Date  . Abdominal hysterectomy     Social History:  reports that she has never smoked. She has never used smokeless tobacco. She reports that she does not drink alcohol or use illicit drugs. Allergies:  Allergies  Allergen Reactions  . Naproxen     REACTION: renal failure  . Nsaids    Family History  Problem Relation Age of Onset  . Stroke Sister   . Hypertension Sister   . Cancer Sister   . Heart attack Brother   . Colon cancer Neg Hx     Medications:  Prior to Admission:  Prescriptions prior to admission  Medication Sig Dispense Refill Last Dose  . acetaminophen (TYLENOL) 500 MG tablet Take 500 mg by mouth every 6 (six) hours as needed for mild pain or headache.    10/28/2015 at 0900  . atorvastatin (LIPITOR) 40 MG tablet Take 1 tablet (40 mg total) by mouth daily. 30 tablet 3 10/27/2015 at 0900  . azithromycin (ZITHROMAX) 250 MG tablet Take 2 tablets (500 mg) on  Day 1,  followed by 1 tablet (250 mg) once daily on Days 2 through 5. (Patient taking differently: Take 250-500 mg by mouth daily. Take 2 tablets (500 mg) on  Day 1,  followed by 1 tablet (250 mg) once daily on Days 2 through 5. Started 10/27/15) 6 each 1 10/27/2015 at 2100  . benazepril (LOTENSIN) 40 MG tablet Take 1 tablet (40 mg total) by mouth daily. To last til next appointment 30 tablet 3  09/09/2015  . benazepril (LOTENSIN) 40 MG tablet Take 40 mg by mouth daily.   10/27/2015 at 0900  . Esomeprazole Magnesium (NEXIUM PO) Take 1 capsule by mouth daily.   10/27/2015 at 0900  . hydrochlorothiazide (HYDRODIURIL) 25 MG tablet Take 1 tablet (25 mg total) by mouth daily. 90 tablet 3 10/27/2015 at 0900  . promethazine (PHENERGAN) 25 MG tablet Take 1 tablet (25 mg total) by mouth every 6 (six) hours as needed for nausea or vomiting. 30 tablet 3 More than a month at Unknown time   Scheduled: . atorvastatin  40 mg Oral Daily  . azithromycin  250 mg Oral Daily  . benzonatate  100 mg Oral BID  . cefTRIAXone (ROCEPHIN)  IV  1 g Intravenous Q24H  . enoxaparin (LOVENOX) injection  30 mg Subcutaneous Q24H    ROS: as per HPI  Blood pressure 119/52, pulse 55, temperature 98.9 F (37.2 C), temperature source Oral, resp. rate 18, height '5\' 3"'  (1.6 m), weight 85.6 kg (188 lb 11.4 oz), SpO2 100 %.  General appearance: alert and cooperative Head: Normocephalic, without obvious abnormality, atraumatic Nose: Nares normal. Septum midline. Mucosa normal. No drainage or sinus tenderness. Throat: lips, mucosa, and tongue normal; teeth and gums normal Resp: clear to auscultation bilaterally Chest  wall: no tenderness Cardio: regular rate and rhythm, S1, S2 normal, no murmur, click, rub or gallop GI: soft, non-tender; bowel sounds normal; no masses,  no organomegaly Extremities: edema 1+ Skin: Skin color, texture, turgor normal. No rashes or lesions Neurologic: Grossly normal Results for orders placed or performed during the hospital encounter of 10/28/15 (from the past 48 hour(s))  Urinalysis, Routine w reflex microscopic (not at [facility])     Status: Abnormal   Collection Time: 10/28/15  9:00 AM  Result Value Ref Range   Color, Urine YELLOW YELLOW   APPearance TURBID (A) CLEAR   Specific Gravity, Urine >1.046 (H) 1.005 - 1.030   pH 5.5 5.0 - 8.0   Glucose, UA NEGATIVE NEGATIVE mg/dL   Hgb urine  dipstick LARGE (A) NEGATIVE   Bilirubin Urine SMALL (A) NEGATIVE   Ketones, ur 15 (A) NEGATIVE mg/dL   Protein, ur >300 (A) NEGATIVE mg/dL   Nitrite NEGATIVE NEGATIVE   Leukocytes, UA NEGATIVE NEGATIVE  Urine microscopic-add on     Status: Abnormal   Collection Time: 10/28/15  9:00 AM  Result Value Ref Range   Squamous Epithelial / LPF 6-30 (A) NONE SEEN    Comment: Please note change in reference range.   WBC, UA 0-5 0 - 5 WBC/hpf    Comment: Please note change in reference range.   RBC / HPF 6-30 0 - 5 RBC/hpf    Comment: Please note change in reference range.   Bacteria, UA MANY (A) NONE SEEN    Comment: Please note change in reference range.   Casts GRANULAR CAST (A) NEGATIVE   Urine-Other LESS THAN 10 mL OF URINE SUBMITTED   CBC with Differential     Status: Abnormal   Collection Time: 10/28/15  9:01 AM  Result Value Ref Range   WBC 9.5 4.0 - 10.5 K/uL   RBC 4.30 3.87 - 5.11 MIL/uL   Hemoglobin 11.6 (L) 12.0 - 15.0 g/dL   HCT 35.1 (L) 36.0 - 46.0 %   MCV 81.6 78.0 - 100.0 fL   MCH 27.0 26.0 - 34.0 pg   MCHC 33.0 30.0 - 36.0 g/dL   RDW 14.0 11.5 - 15.5 %   Platelets 263 150 - 400 K/uL   Neutrophils Relative % 58 %   Lymphocytes Relative 24 %   Monocytes Relative 8 %   Eosinophils Relative 8 %   Basophils Relative 2 %   Neutro Abs 5.4 1.7 - 7.7 K/uL   Lymphs Abs 2.3 0.7 - 4.0 K/uL   Monocytes Absolute 0.8 0.1 - 1.0 K/uL   Eosinophils Absolute 0.8 (H) 0.0 - 0.7 K/uL   Basophils Absolute 0.2 (H) 0.0 - 0.1 K/uL   WBC Morphology ATYPICAL LYMPHOCYTES   Basic metabolic panel     Status: Abnormal   Collection Time: 10/28/15  9:01 AM  Result Value Ref Range   Sodium 138 135 - 145 mmol/L   Potassium 3.5 3.5 - 5.1 mmol/L   Chloride 102 101 - 111 mmol/L   CO2 28 22 - 32 mmol/L   Glucose, Bld 121 (H) 65 - 99 mg/dL   BUN 61 (H) 6 - 20 mg/dL   Creatinine, Ser 5.74 (H) 0.44 - 1.00 mg/dL   Calcium 7.8 (L) 8.9 - 10.3 mg/dL   GFR calc non Af Amer 7 (L) >60 mL/min   GFR calc Af  Amer 8 (L) >60 mL/min    Comment: (NOTE) The eGFR has been calculated using the CKD EPI equation. This calculation  has not been validated in all clinical situations. eGFR's persistently <60 mL/min signify possible Chronic Kidney Disease.    Anion gap 8 5 - 15  Basic metabolic panel     Status: Abnormal   Collection Time: 10/28/15  4:07 PM  Result Value Ref Range   Sodium 139 135 - 145 mmol/L   Potassium 4.5 3.5 - 5.1 mmol/L   Chloride 103 101 - 111 mmol/L   CO2 26 22 - 32 mmol/L   Glucose, Bld 92 65 - 99 mg/dL   BUN 58 (H) 6 - 20 mg/dL   Creatinine, Ser 5.93 (H) 0.44 - 1.00 mg/dL   Calcium 7.6 (L) 8.9 - 10.3 mg/dL   GFR calc non Af Amer 7 (L) >60 mL/min   GFR calc Af Amer 8 (L) >60 mL/min    Comment: (NOTE) The eGFR has been calculated using the CKD EPI equation. This calculation has not been validated in all clinical situations. eGFR's persistently <60 mL/min signify possible Chronic Kidney Disease.    Anion gap 10 5 - 15  Basic metabolic panel     Status: Abnormal   Collection Time: 10/29/15  6:15 AM  Result Value Ref Range   Sodium 139 135 - 145 mmol/L   Potassium 3.9 3.5 - 5.1 mmol/L   Chloride 104 101 - 111 mmol/L   CO2 24 22 - 32 mmol/L   Glucose, Bld 85 65 - 99 mg/dL   BUN 63 (H) 6 - 20 mg/dL   Creatinine, Ser 6.44 (H) 0.44 - 1.00 mg/dL   Calcium 7.5 (L) 8.9 - 10.3 mg/dL   GFR calc non Af Amer 6 (L) >60 mL/min   GFR calc Af Amer 7 (L) >60 mL/min    Comment: (NOTE) The eGFR has been calculated using the CKD EPI equation. This calculation has not been validated in all clinical situations. eGFR's persistently <60 mL/min signify possible Chronic Kidney Disease.    Anion gap 11 5 - 15   Dg Chest 2 View  10/28/2015  CLINICAL DATA:  Cough and nausea.  Vomiting. EXAM: CHEST - 2 VIEW COMPARISON:  Two-view chest x-ray 10/05/2011 FINDINGS: The heart is enlarged. The left lower lobe pneumonia is knee. A small effusion is associated. There is no edema to suggest failure.  No other focal airspace disease is present. The visualized soft tissues and bony thorax are unremarkable. IMPRESSION: 1. New left lower lobe pneumonia. 2. Cardiomegaly without failure. Electronically Signed   By: San Morelle M.D.   On: 10/28/2015 13:06   US Renal  10/29/2015  CLINICAL DATA:  Acute kidney insufficiency EXAM: RENAL / URINARY TRACT ULTRASOUND COMPLETE COMPARISON:  10/30/2009 CT scan of the abdomen and pelvis FINDINGS: Right Kidney: Length: 12.8 cm. There is increased echogenicity suspicious for medical renal disease. No hydronephrosis or renal calculus. Left Kidney: Length: 14.5 cm. Increased cortical echogenicity suspicious for medical renal disease. No hydronephrosis or renal calculus. Bladder: decompressed with Foley catheter. IMPRESSION: 1. Bilateral echogenic kidneys suspicious for medical renal disease. No hydronephrosis or renal calculus. Electronically Signed   By: Lahoma Crocker M.D.   On: 10/29/2015 16:50    Assessment:  1 AKI, hemodynamically mediated due to GI illness, Lotensin on board, ?NSAID and possible immune complex mediated GN(proteinuria, hypoalbuminemia, microhematuria) due to pneumonia 2  Plan: 1 Support with IVF, antibiotics, cessation of potential nephrotoxins all as you are doing 2 May need to entertain renal biopsy for post infx GN/immune complex due to PNA if this is prolonged recovery 3  Will check complements  Chanc Kervin C 10/29/2015, 5:26 PM

## 2015-10-29 NOTE — Progress Notes (Signed)
[facility] PROGRESS NOTE  BRITENY MEHUS [id number] DOB: 09-Feb-1951 DOA: 10/28/2015 PCP: Alesia Richards, MD  Assessment/Plan: 1. AKI/Suspect ATN, ?oliguric, urine output not recorded,  -due to dehydration with ACE/Diuretic and NSAID use -place foley, Renal US -Stopped HCTZ, ACE on admission -IVF, ? Cut down rate -Renal consult, d/w Dr.Powell -Hopeful for renal recovery, baseline creatinine normal  2. CAP -continue rocephin and zithromax -supportive care -FU CXR in 4-6weeks  3. PRe DM -stable  4. HTN -stable, ACE/HCTZ on hold  DVT proph: lovenox  Code Status: Full Code Family Communication: none at bedside Disposition Plan: home when creatinine improves   Consultants: Renal  HPI/Subjective: Was nauseated this am, didn't urinate yesterday, urinated small amount this am   Objective: Filed Vitals:   10/29/15 0541 10/29/15 0939  BP: 100/48 133/75  Pulse: 60 61  Temp: 98.5 F (36.9 C) 98.7 F (37.1 C)  Resp: 18 18    Intake/Output Summary (Last 24 hours) at 10/29/15 1016 Last data filed at 10/29/15 0941  Gross per 24 hour  Intake   1240 ml  Output      0 ml  Net   1240 ml   Filed Weights   10/28/15 0826 10/28/15 0829 10/28/15 2102  Weight: 85.276 kg (188 lb) 85.276 kg (188 lb) 85.6 kg (188 lb 11.4 oz)    Exam:   General:  AAOx3  Cardiovascular: S1S2/RRR  Respiratory: few ronchi at L base  Abdomen: soft, NT, BS present  Musculoskeletal: no edema c/c   Data Reviewed: Basic Metabolic Panel:  Recent Labs Lab 10/27/15 1218 10/28/15 0901 10/28/15 1607 10/29/15 0615  NA 139 138 139 139  K 3.9 3.5 4.5 3.9  CL 100 102 103 104  CO2 31 28 26 24   GLUCOSE 96 121* 92 85  BUN 59* 61* 58* 63*  CREATININE 4.92* 5.74* 5.93* 6.44*  CALCIUM 8.0* 7.8* 7.6* 7.5*   Liver Function Tests:  Recent Labs Lab 10/27/15 1218  AST 21  ALT 14  ALKPHOS 69  BILITOT 0.3  PROT 5.5*  ALBUMIN 2.5*    Recent Labs Lab 10/27/15 1218   AMYLASE 93   No results for input(s): AMMONIA in the last 168 hours. CBC:  Recent Labs Lab 10/27/15 1218 10/28/15 0901  WBC 9.3 9.5  NEUTROABS 5.5 5.4  HGB 12.4 11.6*  HCT 36.9 35.1*  MCV 81.6 81.6  PLT 265 263   Cardiac Enzymes: No results for input(s): CKTOTAL, CKMB, CKMBINDEX, TROPONINI in the last 168 hours. BNP (last 3 results) No results for input(s): BNP in the last 8760 hours.  ProBNP (last 3 results) No results for input(s): PROBNP in the last 8760 hours.  CBG: No results for input(s): GLUCAP in the last 168 hours.  Recent Results (from the past 240 hour(s))  Urine culture     Status: None   Collection Time: 10/27/15 12:05 PM  Result Value Ref Range Status   Colony Count 30,000 COLONIES/ML  Final   Organism ID, Bacteria Multiple bacterial morphotypes present, none  Final   Organism ID, Bacteria predominant. Suggest appropriate recollection if   Final   Organism ID, Bacteria clinically indicated.  Final     Studies: Dg Chest 2 View  10/28/2015  CLINICAL DATA:  Cough and nausea.  Vomiting. EXAM: CHEST - 2 VIEW COMPARISON:  Two-view chest x-ray 10/05/2011 FINDINGS: The heart is enlarged. The left lower lobe pneumonia is knee. A small effusion is associated. There is no edema to suggest failure. No other focal airspace  disease is present. The visualized soft tissues and bony thorax are unremarkable. IMPRESSION: 1. New left lower lobe pneumonia. 2. Cardiomegaly without failure. Electronically Signed   By: San Morelle M.D.   On: 10/28/2015 13:06    Scheduled Meds: . atorvastatin  40 mg Oral Daily  . azithromycin  250 mg Oral Daily  . benzonatate  100 mg Oral BID  . cefTRIAXone (ROCEPHIN)  IV  1 g Intravenous Q24H  . enoxaparin (LOVENOX) injection  30 mg Subcutaneous Q24H  . pneumococcal 23 valent vaccine  0.5 mL Intramuscular Tomorrow-1000   Continuous Infusions: . sodium chloride 125 mL/hr at 10/28/15 1429   Antibiotics Given (last 72 hours)     Date/Time Action Medication Dose   10/28/15 1636 Given   azithromycin (ZITHROMAX) tablet 500 mg 500 mg      Principal Problem:   Acute kidney injury ([place]) Active Problems:   Hypertension   Cough   AKI (acute kidney injury) ([place])    Time spent: 33min    Samiyyah Moffa  [facility] Pager [phone]. If 7PM-7AM, please contact night-coverage at [email or web address], password [facility] 10/29/2015, 10:16 AM

## 2015-10-30 LAB — CBC
HCT: 33.5 % — ABNORMAL LOW (ref 36.0–46.0)
Hemoglobin: 10.9 g/dL — ABNORMAL LOW (ref 12.0–15.0)
MCH: 26.8 pg (ref 26.0–34.0)
MCHC: 32.5 g/dL (ref 30.0–36.0)
MCV: 82.5 fL (ref 78.0–100.0)
PLATELETS: 288 10*3/uL (ref 150–400)
RBC: 4.06 MIL/uL (ref 3.87–5.11)
RDW: 14 % (ref 11.5–15.5)
WBC: 10 10*3/uL (ref 4.0–10.5)

## 2015-10-30 LAB — BASIC METABOLIC PANEL
ANION GAP: 10 (ref 5–15)
BUN: 72 mg/dL — ABNORMAL HIGH (ref 6–20)
CO2: 21 mmol/L — ABNORMAL LOW (ref 22–32)
Calcium: 7.6 mg/dL — ABNORMAL LOW (ref 8.9–10.3)
Chloride: 107 mmol/L (ref 101–111)
Creatinine, Ser: 7.13 mg/dL — ABNORMAL HIGH (ref 0.44–1.00)
GFR, EST AFRICAN AMERICAN: 6 mL/min — AB (ref >60)
GFR, EST NON AFRICAN AMERICAN: 5 mL/min — AB (ref >60)
Glucose, Bld: 87 mg/dL (ref 65–99)
POTASSIUM: 4 mmol/L (ref 3.5–5.1)
SODIUM: 138 mmol/L (ref 135–145)

## 2015-10-30 NOTE — Progress Notes (Signed)
[facility] PROGRESS NOTE  Lydia Edwards [id number] DOB: Sep 12, 1951 DOA: 10/28/2015 PCP: Alesia Richards, MD  Assessment/Plan: 1. AKI/Suspect ATN, ?oliguric, urine output not recorded,  -due to dehydration with ACE/Diuretic and NSAID use -foley, Renal US without hydronephrosis -Stopped HCTZ, ACE on admission -she is 7.8L positive, will stop IVF, will d/w Renal -Renal consult appreciated -Hopeful for renal recovery, baseline creatinine normal  2. CAP -continue rocephin and zithromax -supportive care -FU CXR in 4-6weeks  3. PRe DM -stable  4. HTN -stable, ACE/HCTZ on hold  DVT proph: lovenox  Code Status: Full Code Family Communication: none at bedside Disposition Plan: home when creatinine improves   Consultants: Renal  HPI/Subjective: Feels ok, breathing ok, no N/V   Objective: Filed Vitals:   10/30/15 0359 10/30/15 0905  BP: 133/73 122/65  Pulse: 59 57  Temp: 98.5 F (36.9 C) 98.3 F (36.8 C)  Resp: 18 18    Intake/Output Summary (Last 24 hours) at 10/30/15 1450 Last data filed at 10/30/15 1100  Gross per 24 hour  Intake 6344.17 ml  Output    200 ml  Net 6144.17 ml   Filed Weights   10/28/15 0826 10/28/15 0829 10/28/15 2102  Weight: 85.276 kg (188 lb) 85.276 kg (188 lb) 85.6 kg (188 lb 11.4 oz)    Exam:   General:  AAOx3  Cardiovascular: S1S2/RRR  Respiratory: few ronchi at L base  Abdomen: soft, NT, BS present  Musculoskeletal: trace edema c/c   Data Reviewed: Basic Metabolic Panel:  Recent Labs Lab 10/27/15 1218 10/28/15 0901 10/28/15 1607 10/29/15 0615 10/30/15 0657  NA 139 138 139 139 138  K 3.9 3.5 4.5 3.9 4.0  CL 100 102 103 104 107  CO2 31 28 26 24  21*  GLUCOSE 96 121* 92 85 87  BUN 59* 61* 58* 63* 72*  CREATININE 4.92* 5.74* 5.93* 6.44* 7.13*  CALCIUM 8.0* 7.8* 7.6* 7.5* 7.6*   Liver Function Tests:  Recent Labs Lab 10/27/15 1218  AST 21  ALT 14  ALKPHOS 69  BILITOT 0.3  PROT 5.5*   ALBUMIN 2.5*    Recent Labs Lab 10/27/15 1218  AMYLASE 93   No results for input(s): AMMONIA in the last 168 hours. CBC:  Recent Labs Lab 10/27/15 1218 10/28/15 0901 10/30/15 0657  WBC 9.3 9.5 10.0  NEUTROABS 5.5 5.4  --   HGB 12.4 11.6* 10.9*  HCT 36.9 35.1* 33.5*  MCV 81.6 81.6 82.5  PLT 265 263 288   Cardiac Enzymes: No results for input(s): CKTOTAL, CKMB, CKMBINDEX, TROPONINI in the last 168 hours. BNP (last 3 results) No results for input(s): BNP in the last 8760 hours.  ProBNP (last 3 results) No results for input(s): PROBNP in the last 8760 hours.  CBG: No results for input(s): GLUCAP in the last 168 hours.  Recent Results (from the past 240 hour(s))  Urine culture     Status: None   Collection Time: 10/27/15 12:05 PM  Result Value Ref Range Status   Colony Count 30,000 COLONIES/ML  Final   Organism ID, Bacteria Multiple bacterial morphotypes present, none  Final   Organism ID, Bacteria predominant. Suggest appropriate recollection if   Final   Organism ID, Bacteria clinically indicated.  Final     Studies: US Renal  10/29/2015  CLINICAL DATA:  Acute kidney insufficiency EXAM: RENAL / URINARY TRACT ULTRASOUND COMPLETE COMPARISON:  10/30/2009 CT scan of the abdomen and pelvis FINDINGS: Right Kidney: Length: 12.8 cm. There is increased echogenicity suspicious for medical  renal disease. No hydronephrosis or renal calculus. Left Kidney: Length: 14.5 cm. Increased cortical echogenicity suspicious for medical renal disease. No hydronephrosis or renal calculus. Bladder: decompressed with Foley catheter. IMPRESSION: 1. Bilateral echogenic kidneys suspicious for medical renal disease. No hydronephrosis or renal calculus. Electronically Signed   By: Lahoma Crocker M.D.   On: 10/29/2015 16:50    Scheduled Meds: . atorvastatin  40 mg Oral Daily  . azithromycin  250 mg Oral Daily  . benzonatate  100 mg Oral BID  . cefTRIAXone (ROCEPHIN)  IV  1 g Intravenous Q24H  .  enoxaparin (LOVENOX) injection  30 mg Subcutaneous Q24H   Continuous Infusions: . sodium chloride 100 mL/hr at 10/29/15 1849   Antibiotics Given (last 72 hours)    Date/Time Action Medication Dose Rate   10/28/15 1636 Given   azithromycin (ZITHROMAX) tablet 500 mg 500 mg    10/29/15 1054 Given   azithromycin (ZITHROMAX) tablet 250 mg 250 mg    10/29/15 1522 Given   cefTRIAXone (ROCEPHIN) 1 g in dextrose 5 % 50 mL IVPB 1 g 100 mL/hr   10/30/15 1008 Given   azithromycin (ZITHROMAX) tablet 250 mg 250 mg       Principal Problem:   Acute kidney injury ([place]) Active Problems:   Hypertension   Cough   AKI (acute kidney injury) ([place])    Time spent: 45min    Lydia Edwards  [facility] Pager [phone]. If 7PM-7AM, please contact night-coverage at [email or web address], password [facility] 10/30/2015, 2:50 PM  LOS: 1 day

## 2015-10-30 NOTE — Progress Notes (Signed)
Assessment:  1 AKI, hemodynamically mediated due to GI illness, Lotensin on board, ?NSAID and possible immune complex mediated GN(proteinuria, hypoalbuminemia, microhematuria) due to pneumonia or other GN  Plan: 1 Continue support with IVF, antibiotics, cessation of potential nephrotoxins all as you are doing 2 May need to entertain renal biopsy for post infx GN/immune complex due to PNA if this is prolonged recovery 3 Will check ANA, ANCA and anti-GBM for completeness 4 DC foley  Subjective: Interval History:   Objective: Vital signs in last 24 hours: Temp:  [98.2 F (36.8 C)-98.9 F (37.2 C)] 98.5 F (36.9 C) (12/01 0359) Pulse Rate:  [55-61] 59 (12/01 0359) Resp:  [18] 18 (12/01 0359) BP: (119-133)/(52-75) 133/73 mmHg (12/01 0359) SpO2:  [98 %-100 %] 99 % (12/01 0359) Weight change:   Intake/Output from previous day: 11/30 0701 - 12/01 0700 In: 6424.2 [P.O.:1560; I.V.:4814.2; IV Piggyback:50] Out: 100 [Urine:100] Intake/Output this shift:    General appearance: alert and cooperative Chest wall: no tenderness Cardio: regular rate and rhythm, S1, S2 normal, no murmur, click, rub or gallop GI: soft, non-tender; bowel sounds normal; no masses,  no organomegaly Extremities: edema tr  Lab Results:  Recent Labs  10/28/15 0901 10/30/15 0657  WBC 9.5 10.0  HGB 11.6* 10.9*  HCT 35.1* 33.5*  PLT 263 288   BMET:  Recent Labs  10/29/15 0615 10/30/15 0657  NA 139 138  K 3.9 4.0  CL 104 107  CO2 24 21*  GLUCOSE 85 87  BUN 63* 72*  CREATININE 6.44* 7.13*  CALCIUM 7.5* 7.6*   No results for input(s): PTH in the last 72 hours. Iron Studies: No results for input(s): IRON, TIBC, TRANSFERRIN, FERRITIN in the last 72 hours. Studies/Results: Dg Chest 2 View  10/28/2015  CLINICAL DATA:  Cough and nausea.  Vomiting. EXAM: CHEST - 2 VIEW COMPARISON:  Two-view chest x-ray 10/05/2011 FINDINGS: The heart is enlarged. The left lower lobe pneumonia is knee. A small effusion is  associated. There is no edema to suggest failure. No other focal airspace disease is present. The visualized soft tissues and bony thorax are unremarkable. IMPRESSION: 1. New left lower lobe pneumonia. 2. Cardiomegaly without failure. Electronically Signed   By: San Morelle M.D.   On: 10/28/2015 13:06   US Renal  10/29/2015  CLINICAL DATA:  Acute kidney insufficiency EXAM: RENAL / URINARY TRACT ULTRASOUND COMPLETE COMPARISON:  10/30/2009 CT scan of the abdomen and pelvis FINDINGS: Right Kidney: Length: 12.8 cm. There is increased echogenicity suspicious for medical renal disease. No hydronephrosis or renal calculus. Left Kidney: Length: 14.5 cm. Increased cortical echogenicity suspicious for medical renal disease. No hydronephrosis or renal calculus. Bladder: decompressed with Foley catheter. IMPRESSION: 1. Bilateral echogenic kidneys suspicious for medical renal disease. No hydronephrosis or renal calculus. Electronically Signed   By: Lahoma Crocker M.D.   On: 10/29/2015 16:50    Scheduled: . atorvastatin  40 mg Oral Daily  . azithromycin  250 mg Oral Daily  . benzonatate  100 mg Oral BID  . cefTRIAXone (ROCEPHIN)  IV  1 g Intravenous Q24H  . enoxaparin (LOVENOX) injection  30 mg Subcutaneous Q24H     LOS: 1 day   Jacquees Gongora C 10/30/2015,8:29 AM

## 2015-10-30 NOTE — Progress Notes (Signed)
Foley removed per MD order.  Patient tolerated well.  

## 2015-10-31 LAB — BASIC METABOLIC PANEL
Anion gap: 13 (ref 5–15)
BUN: 81 mg/dL — AB (ref 6–20)
CALCIUM: 7.9 mg/dL — AB (ref 8.9–10.3)
CO2: 19 mmol/L — AB (ref 22–32)
CREATININE: 7.88 mg/dL — AB (ref 0.44–1.00)
Chloride: 107 mmol/L (ref 101–111)
GFR calc Af Amer: 6 mL/min — ABNORMAL LOW (ref >60)
GFR, EST NON AFRICAN AMERICAN: 5 mL/min — AB (ref >60)
GLUCOSE: 81 mg/dL (ref 65–99)
Potassium: 4 mmol/L (ref 3.5–5.1)
Sodium: 139 mmol/L (ref 135–145)

## 2015-10-31 LAB — C3 COMPLEMENT: C3 COMPLEMENT: 128 mg/dL (ref 82–167)

## 2015-10-31 LAB — C4 COMPLEMENT: COMPLEMENT C4, BODY FLUID: 34 mg/dL (ref 14–44)

## 2015-10-31 MED ORDER — FUROSEMIDE 10 MG/ML IJ SOLN
160.0000 mg | Freq: Once | INTRAVENOUS | Status: AC
  Administered 2015-10-31: 160 mg via INTRAVENOUS
  Filled 2015-10-31: qty 16

## 2015-10-31 NOTE — Progress Notes (Signed)
[facility] PROGRESS NOTE  Lydia Edwards [id number] DOB: 1951-10-09 DOA: 10/28/2015 PCP: Alesia Richards, MD  Assessment/Plan: 1. AKI/ ATN, Oliguric -due to dehydration with ACE/Diuretic and NSAID use -foley, Renal US without hydronephrosis -Stopped HCTZ, ACE on admission, hydrated for almost 3d -she is 8L positive, stopped IVF -Renal following -Hopeful for renal recovery, baseline creatinine normal -may need temporary HD if decompensates  2. CAP -continue rocephin and zithromax -supportive care, change to PO Abx tomorrow -FU CXR in 4-6weeks  3. PRe DM -stable  4. HTN -stable, ACE/HCTZ on hold  DVT proph: lovenox  Code Status: Full Code Family Communication: none at bedside Disposition Plan: home when creatinine improves   Consultants: Renal  HPI/Subjective: Feels ok, breathing ok, some nausea today  Objective: Filed Vitals:   10/31/15 1041 10/31/15 1237  BP: 135/64 138/65  Pulse: 52 60  Temp: 98.6 F (37 C) 98.6 F (37 C)  Resp: 18 18    Intake/Output Summary (Last 24 hours) at 10/31/15 1404 Last data filed at 10/31/15 1330  Gross per 24 hour  Intake    530 ml  Output      1 ml  Net    529 ml   Filed Weights   10/28/15 0829 10/28/15 2102 10/30/15 2045  Weight: 85.276 kg (188 lb) 85.6 kg (188 lb 11.4 oz) 87.907 kg (193 lb 12.8 oz)    Exam:   General:  AAOx3  Cardiovascular: S1S2/RRR  Respiratory: few ronchi at L base  Abdomen: soft, NT, BS present  Musculoskeletal: trace edema c/c   Data Reviewed: Basic Metabolic Panel:  Recent Labs Lab 10/28/15 0901 10/28/15 1607 10/29/15 0615 10/30/15 0657 10/31/15 0639  NA 138 139 139 138 139  K 3.5 4.5 3.9 4.0 4.0  CL 102 103 104 107 107  CO2 28 26 24  21* 19*  GLUCOSE 121* 92 85 87 81  BUN 61* 58* 63* 72* 81*  CREATININE 5.74* 5.93* 6.44* 7.13* 7.88*  CALCIUM 7.8* 7.6* 7.5* 7.6* 7.9*   Liver Function Tests:  Recent Labs Lab 10/27/15 1218  AST 21  ALT 14  ALKPHOS  69  BILITOT 0.3  PROT 5.5*  ALBUMIN 2.5*    Recent Labs Lab 10/27/15 1218  AMYLASE 93   No results for input(s): AMMONIA in the last 168 hours. CBC:  Recent Labs Lab 10/27/15 1218 10/28/15 0901 10/30/15 0657  WBC 9.3 9.5 10.0  NEUTROABS 5.5 5.4  --   HGB 12.4 11.6* 10.9*  HCT 36.9 35.1* 33.5*  MCV 81.6 81.6 82.5  PLT 265 263 288   Cardiac Enzymes: No results for input(s): CKTOTAL, CKMB, CKMBINDEX, TROPONINI in the last 168 hours. BNP (last 3 results) No results for input(s): BNP in the last 8760 hours.  ProBNP (last 3 results) No results for input(s): PROBNP in the last 8760 hours.  CBG: No results for input(s): GLUCAP in the last 168 hours.  Recent Results (from the past 240 hour(s))  Urine culture     Status: None   Collection Time: 10/27/15 12:05 PM  Result Value Ref Range Status   Colony Count 30,000 COLONIES/ML  Final   Organism ID, Bacteria Multiple bacterial morphotypes present, none  Final   Organism ID, Bacteria predominant. Suggest appropriate recollection if   Final   Organism ID, Bacteria clinically indicated.  Final     Studies: US Renal  10/29/2015  CLINICAL DATA:  Acute kidney insufficiency EXAM: RENAL / URINARY TRACT ULTRASOUND COMPLETE COMPARISON:  10/30/2009 CT scan of the abdomen  and pelvis FINDINGS: Right Kidney: Length: 12.8 cm. There is increased echogenicity suspicious for medical renal disease. No hydronephrosis or renal calculus. Left Kidney: Length: 14.5 cm. Increased cortical echogenicity suspicious for medical renal disease. No hydronephrosis or renal calculus. Bladder: decompressed with Foley catheter. IMPRESSION: 1. Bilateral echogenic kidneys suspicious for medical renal disease. No hydronephrosis or renal calculus. Electronically Signed   By: Lahoma Crocker M.D.   On: 10/29/2015 16:50    Scheduled Meds: . atorvastatin  40 mg Oral Daily  . azithromycin  250 mg Oral Daily  . benzonatate  100 mg Oral BID  . cefTRIAXone (ROCEPHIN)  IV   1 g Intravenous Q24H  . enoxaparin (LOVENOX) injection  30 mg Subcutaneous Q24H  . furosemide  160 mg Intravenous Once   Continuous Infusions:   Antibiotics Given (last 72 hours)    Date/Time Action Medication Dose Rate   10/28/15 1636 Given   azithromycin (ZITHROMAX) tablet 500 mg 500 mg    10/29/15 1054 Given   azithromycin (ZITHROMAX) tablet 250 mg 250 mg    10/29/15 1522 Given   cefTRIAXone (ROCEPHIN) 1 g in dextrose 5 % 50 mL IVPB 1 g 100 mL/hr   10/30/15 1008 Given   azithromycin (ZITHROMAX) tablet 250 mg 250 mg    10/30/15 1632 Given  [due on previous shift]   cefTRIAXone (ROCEPHIN) 1 g in dextrose 5 % 50 mL IVPB 1 g 100 mL/hr   10/31/15 1042 Given   azithromycin (ZITHROMAX) tablet 250 mg 250 mg       Principal Problem:   Acute kidney injury ([place]) Active Problems:   Hypertension   Cough   AKI (acute kidney injury) ([place])    Time spent: 43min    [name]  [facility] Pager [phone]. If 7PM-7AM, please contact night-coverage at [email or web address], password [facility] 10/31/2015, 2:04 PM  LOS: 2 days

## 2015-10-31 NOTE — Progress Notes (Signed)
Expand All Collapse All   Assessment:  1 AKI, hemodynamically mediated due to GI illness, Lotensin on board, ?NSAID and possible immune complex mediated GN(proteinuria, hypoalbuminemia, microhematuria) due to pneumonia or other GN  Plan: 1 Continue support with antibiotics 2 May need to entertain renal biopsy for post infx GN/immune complex due to PNA if this is prolonged recovery 3 Checking ANA, ANCA and anti-GBM for completeness 4 One dose of furosemide 5 Bladder scan      Subjective: Interval History: less UOP  Objective: Vital signs in last 24 hours: Temp:  [98 F (36.7 C)-98.6 F (37 C)] 98.6 F (37 C) (12/02 1041) Pulse Rate:  [52-76] 52 (12/02 1041) Resp:  [16-18] 18 (12/02 1041) BP: (112-141)/(61-70) 135/64 mmHg (12/02 1041) SpO2:  [98 %-100 %] 100 % (12/02 1041) Weight:  [87.907 kg (193 lb 12.8 oz)] 87.907 kg (193 lb 12.8 oz) (12/01 2045) Weight change:   Intake/Output from previous day: 12/01 0701 - 12/02 0700 In: [id number] [P.O.:720; I.V.:400; IV Piggyback:50] Out: 101 [Urine:100; Stool:1] Intake/Output this shift:    General appearance: alert and cooperative Resp: clear to auscultation bilaterally GI: soft, non-tender; bowel sounds normal; no masses,  no organomegaly Extremities: edema 2+  Lab Results:  Recent Labs  10/30/15 0657  WBC 10.0  HGB 10.9*  HCT 33.5*  PLT 288   BMET:  Recent Labs  10/30/15 0657 10/31/15 0639  NA 138 139  K 4.0 4.0  CL 107 107  CO2 21* 19*  GLUCOSE 87 81  BUN 72* 81*  CREATININE 7.13* 7.88*  CALCIUM 7.6* 7.9*   No results for input(s): PTH in the last 72 hours. Iron Studies: No results for input(s): IRON, TIBC, TRANSFERRIN, FERRITIN in the last 72 hours. Studies/Results: US Renal  10/29/2015  CLINICAL DATA:  Acute kidney insufficiency EXAM: RENAL / URINARY TRACT ULTRASOUND COMPLETE COMPARISON:  10/30/2009 CT scan of the abdomen and pelvis FINDINGS: Right Kidney: Length: 12.8 cm. There is increased echogenicity  suspicious for medical renal disease. No hydronephrosis or renal calculus. Left Kidney: Length: 14.5 cm. Increased cortical echogenicity suspicious for medical renal disease. No hydronephrosis or renal calculus. Bladder: decompressed with Foley catheter. IMPRESSION: 1. Bilateral echogenic kidneys suspicious for medical renal disease. No hydronephrosis or renal calculus. Electronically Signed   By: Lahoma Crocker M.D.   On: 10/29/2015 16:50    Scheduled: . atorvastatin  40 mg Oral Daily  . azithromycin  250 mg Oral Daily  . benzonatate  100 mg Oral BID  . cefTRIAXone (ROCEPHIN)  IV  1 g Intravenous Q24H  . enoxaparin (LOVENOX) injection  30 mg Subcutaneous Q24H    LOS: 2 days   Khole Branch C 10/31/2015,11:59 AM

## 2015-11-01 LAB — BASIC METABOLIC PANEL
Anion gap: 11 (ref 5–15)
BUN: 86 mg/dL — AB (ref 6–20)
CALCIUM: 7.7 mg/dL — AB (ref 8.9–10.3)
CHLORIDE: 107 mmol/L (ref 101–111)
CO2: 20 mmol/L — AB (ref 22–32)
CREATININE: 8.64 mg/dL — AB (ref 0.44–1.00)
GFR calc non Af Amer: 4 mL/min — ABNORMAL LOW (ref >60)
GFR, EST AFRICAN AMERICAN: 5 mL/min — AB (ref >60)
GLUCOSE: 105 mg/dL — AB (ref 65–99)
Potassium: 3.9 mmol/L (ref 3.5–5.1)
Sodium: 138 mmol/L (ref 135–145)

## 2015-11-01 MED ORDER — FUROSEMIDE 10 MG/ML IJ SOLN
200.0000 mg | Freq: Once | INTRAVENOUS | Status: AC
  Administered 2015-11-01: 200 mg via INTRAVENOUS
  Filled 2015-11-01: qty 20

## 2015-11-01 MED ORDER — LEVOFLOXACIN 500 MG PO TABS
500.0000 mg | ORAL_TABLET | ORAL | Status: AC
  Administered 2015-11-01 – 2015-11-05 (×3): 500 mg via ORAL
  Filled 2015-11-01 (×3): qty 1

## 2015-11-01 NOTE — Progress Notes (Addendum)
[facility] PROGRESS NOTE  Lydia Edwards [id number] DOB: 09-14-1951 DOA: 10/28/2015 PCP: Alesia Richards, MD  Assessment/Plan: 1. AKI/ ATN, Oliguric -due to dehydration with ACE/Diuretic and NSAID use, unable to rule out immune mediated GN too -Renal US without hydronephrosis -creatinine worsening remains oliguric -Stopped HCTZ, ACE on admission, hydrated for almost 3d, 8L positive, stopped IVF-2days ago -Renal following, does she need HD catheter, may need temporary HD, no urgent indication for HD at this time -Hopeful for renal recovery, baseline creatinine normal -given lasix x1 yesterday   2. CAP -s/p 3days of rocephin and zithromax -supportive care, change to PO levaquin today -FU CXR in 4-6weeks  3. PRe DM -stable  4. HTN -stable, ACE/HCTZ on hold  DVT proph: lovenox  Code Status: Full Code Family Communication: none at bedside Disposition Plan: home when creatinine improves   Consultants: Renal  HPI/Subjective: Feels ok, breathing ok, some nausea today  Objective: Filed Vitals:   11/01/15 0500 11/01/15 0800  BP: 135/62 139/69  Pulse: 57 73  Temp: 98.2 F (36.8 C) 98.5 F (36.9 C)  Resp: 18 18    Intake/Output Summary (Last 24 hours) at 11/01/15 1005 Last data filed at 11/01/15 0836  Gross per 24 hour  Intake    720 ml  Output    100 ml  Net    620 ml   Filed Weights   10/28/15 2102 10/30/15 2045 10/31/15 2047  Weight: 85.6 kg (188 lb 11.4 oz) 87.907 kg (193 lb 12.8 oz) 84.823 kg (187 lb)    Exam:   General:  AAOx3  Cardiovascular: S1S2/RRR  Respiratory: few ronchi at L base  Abdomen: soft, NT, BS present  Musculoskeletal: trace edema c/c   Data Reviewed: Basic Metabolic Panel:  Recent Labs Lab 10/28/15 1607 10/29/15 0615 10/30/15 0657 10/31/15 0639 11/01/15 0412  NA 139 139 138 139 138  K 4.5 3.9 4.0 4.0 3.9  CL 103 104 107 107 107  CO2 26 24 21* 19* 20*  GLUCOSE 92 85 87 81 105*  BUN 58* 63* 72* 81*  86*  CREATININE 5.93* 6.44* 7.13* 7.88* 8.64*  CALCIUM 7.6* 7.5* 7.6* 7.9* 7.7*   Liver Function Tests:  Recent Labs Lab 10/27/15 1218  AST 21  ALT 14  ALKPHOS 69  BILITOT 0.3  PROT 5.5*  ALBUMIN 2.5*    Recent Labs Lab 10/27/15 1218  AMYLASE 93   No results for input(s): AMMONIA in the last 168 hours. CBC:  Recent Labs Lab 10/27/15 1218 10/28/15 0901 10/30/15 0657  WBC 9.3 9.5 10.0  NEUTROABS 5.5 5.4  --   HGB 12.4 11.6* 10.9*  HCT 36.9 35.1* 33.5*  MCV 81.6 81.6 82.5  PLT 265 263 288   Cardiac Enzymes: No results for input(s): CKTOTAL, CKMB, CKMBINDEX, TROPONINI in the last 168 hours. BNP (last 3 results) No results for input(s): BNP in the last 8760 hours.  ProBNP (last 3 results) No results for input(s): PROBNP in the last 8760 hours.  CBG: No results for input(s): GLUCAP in the last 168 hours.  Recent Results (from the past 240 hour(s))  Urine culture     Status: None   Collection Time: 10/27/15 12:05 PM  Result Value Ref Range Status   Colony Count 30,000 COLONIES/ML  Final   Organism ID, Bacteria Multiple bacterial morphotypes present, none  Final   Organism ID, Bacteria predominant. Suggest appropriate recollection if   Final   Organism ID, Bacteria clinically indicated.  Final     Studies:  No results found.  Scheduled Meds: . atorvastatin  40 mg Oral Daily  . benzonatate  100 mg Oral BID  . enoxaparin (LOVENOX) injection  30 mg Subcutaneous Q24H  . levofloxacin  500 mg Oral Q48H   Continuous Infusions:   Antibiotics Given (last 72 hours)    Date/Time Action Medication Dose Rate   10/29/15 1054 Given   azithromycin (ZITHROMAX) tablet 250 mg 250 mg    10/29/15 1522 Given   cefTRIAXone (ROCEPHIN) 1 g in dextrose 5 % 50 mL IVPB 1 g 100 mL/hr   10/30/15 1008 Given   azithromycin (ZITHROMAX) tablet 250 mg 250 mg    10/30/15 1632 Given  [due on previous shift]   cefTRIAXone (ROCEPHIN) 1 g in dextrose 5 % 50 mL IVPB 1 g 100 mL/hr    10/31/15 1042 Given   azithromycin (ZITHROMAX) tablet 250 mg 250 mg    10/31/15 1621 Given   cefTRIAXone (ROCEPHIN) 1 g in dextrose 5 % 50 mL IVPB 1 g 100 mL/hr   11/01/15 0949 Given   levofloxacin (LEVAQUIN) tablet 500 mg 500 mg       Principal Problem:   Acute kidney injury ([place]) Active Problems:   Hypertension   Cough   AKI (acute kidney injury) ([place])    Time spent: 38min    Tauni Sanks  [facility] Pager [phone]. If 7PM-7AM, please contact night-coverage at [email or web address], password [facility] 11/01/2015, 10:05 AM  LOS: 3 days

## 2015-11-01 NOTE — Progress Notes (Signed)
Assessment:  1 AKI, hemodynamically mediated due to GI illness, Lotensin on board, ?NSAID and possible immune complex mediated GN(proteinuria, hypoalbuminemia, microhematuria) due to pneumonia or other GN  Plan: 1 Continue support with antibiotics 2 May need to entertain renal biopsy for post infx GN/immune complex due to PNA if this is prolonged recovery 3 Checking ANA, ANCA and anti-GBM for completeness 4 Dialysis education today 5 Cont conservative mgmt, dialysis prn  Subjective: Interval History: N & V today  Objective: Vital signs in last 24 hours: Temp:  [98.2 F (36.8 C)-98.6 F (37 C)] 98.5 F (36.9 C) (12/03 0800) Pulse Rate:  [50-73] 73 (12/03 0800) Resp:  [18] 18 (12/03 0800) BP: (132-141)/(60-69) 139/69 mmHg (12/03 0800) SpO2:  [99 %-100 %] 99 % (12/03 0800) Weight:  [84.823 kg (187 lb)] 84.823 kg (187 lb) (12/02 2047) Weight change: -3.084 kg (-6 lb 12.8 oz)  Intake/Output from previous day: 12/02 0701 - 12/03 0700 In: 480 [P.O.:480] Out: 100 [Urine:100] Intake/Output this shift: Total I/O In: 240 [P.O.:240] Out: -   General appearance: alert and cooperative Resp: clear to auscultation bilaterally Chest wall: no tenderness GI: soft, non-tender; bowel sounds normal; no masses,  no organomegaly Extremities: edema 2+  Lab Results:  Recent Labs  10/30/15 0657  WBC 10.0  HGB 10.9*  HCT 33.5*  PLT 288   BMET:  Recent Labs  10/31/15 0639 11/01/15 0412  NA 139 138  K 4.0 3.9  CL 107 107  CO2 19* 20*  GLUCOSE 81 105*  BUN 81* 86*  CREATININE 7.88* 8.64*  CALCIUM 7.9* 7.7*   No results for input(s): PTH in the last 72 hours. Iron Studies: No results for input(s): IRON, TIBC, TRANSFERRIN, FERRITIN in the last 72 hours. Studies/Results: No results found.  Scheduled: . atorvastatin  40 mg Oral Daily  . benzonatate  100 mg Oral BID  . enoxaparin (LOVENOX) injection  30 mg Subcutaneous Q24H  . levofloxacin  500 mg Oral Q48H    LOS: 3 days    Kailiana Granquist C 11/01/2015,1:06 PM

## 2015-11-01 NOTE — Plan of Care (Signed)
Problem: Activity: Goal: Risk for activity intolerance will decrease Outcome: Completed/Met Date Met:  11/01/15 Pt is able to safely ambulate independently

## 2015-11-02 LAB — CBC
HCT: 34.1 % — ABNORMAL LOW (ref 36.0–46.0)
Hemoglobin: 11 g/dL — ABNORMAL LOW (ref 12.0–15.0)
MCH: 26.4 pg (ref 26.0–34.0)
MCHC: 32.3 g/dL (ref 30.0–36.0)
MCV: 81.8 fL (ref 78.0–100.0)
Platelets: 347 10*3/uL (ref 150–400)
RBC: 4.17 MIL/uL (ref 3.87–5.11)
RDW: 13.7 % (ref 11.5–15.5)
WBC: 9.2 10*3/uL (ref 4.0–10.5)

## 2015-11-02 LAB — URINE MICROSCOPIC-ADD ON

## 2015-11-02 LAB — URINALYSIS, ROUTINE W REFLEX MICROSCOPIC
BILIRUBIN URINE: NEGATIVE
Glucose, UA: 250 mg/dL — AB
KETONES UR: NEGATIVE mg/dL
Leukocytes, UA: NEGATIVE
NITRITE: NEGATIVE
PH: 6 (ref 5.0–8.0)
Protein, ur: >300 mg/dL — AB
Specific Gravity, Urine: 1.035 — ABNORMAL HIGH (ref 1.005–1.030)

## 2015-11-02 LAB — BASIC METABOLIC PANEL
Anion gap: 13 (ref 5–15)
BUN: 87 mg/dL — AB (ref 6–20)
CO2: 18 mmol/L — ABNORMAL LOW (ref 22–32)
CREATININE: 9.48 mg/dL — AB (ref 0.44–1.00)
Calcium: 7.8 mg/dL — ABNORMAL LOW (ref 8.9–10.3)
Chloride: 107 mmol/L (ref 101–111)
GFR calc Af Amer: 4 mL/min — ABNORMAL LOW (ref >60)
GFR, EST NON AFRICAN AMERICAN: 4 mL/min — AB (ref >60)
GLUCOSE: 83 mg/dL (ref 65–99)
Potassium: 4.1 mmol/L (ref 3.5–5.1)
Sodium: 138 mmol/L (ref 135–145)

## 2015-11-02 MED ORDER — DEXTROSE 5 % IV SOLN
200.0000 mg | Freq: Once | INTRAVENOUS | Status: AC
  Administered 2015-11-02: 200 mg via INTRAVENOUS
  Filled 2015-11-02: qty 20

## 2015-11-02 NOTE — Progress Notes (Signed)
RN gave pt education materials on hemodialysis. RN reviewed material and answered all pt questions. Pt verbalized understanding. RN encouraged pt to ask questions whenever needed.   Lydia Edwards

## 2015-11-02 NOTE — Progress Notes (Signed)
[facility] PROGRESS NOTE  Lydia Edwards [id number] DOB: 10-Apr-1951 DOA: 10/28/2015 PCP: Alesia Richards, MD  Assessment/Plan: 1. AKI/ ATN, Oliguric -due to dehydration with ACE/Diuretic and NSAID use, unable to rule out immune mediated GN too -Renal US without hydronephrosis -creatinine worsening, oliguric for 4days, started making some urine overnight 300cc/24h and 150cc this am -Stopped HCTZ, ACE on admission, hydrated for almost 3d, 8L positive, stopped IVF-3days ago -no urgent indication for HD at this time -Hopeful for renal recovery, baseline creatinine normal -given lasix x1 yesterday and 12/2  2. CAP -s/p 3days of rocephin and zithromax -supportive care, changed to PO levaquin 12/3 -FU CXR in 4-6weeks  3. PRe DM -stable  4. HTN -stable, ACE/HCTZ on hold  DVT proph: lovenox  Code Status: Full Code Family Communication: none at bedside Disposition Plan: home when creatinine improves   Consultants: Renal  HPI/Subjective: Feels ok, breathing ok, some nausea today, vomited x1 yesterday   Objective: Filed Vitals:   11/02/15 0515 11/02/15 0852  BP: 138/52 146/65  Pulse: 50 56  Temp: 98.2 F (36.8 C) 98.3 F (36.8 C)  Resp: 18 18    Intake/Output Summary (Last 24 hours) at 11/02/15 1322 Last data filed at 11/02/15 1124  Gross per 24 hour  Intake   1080 ml  Output    451 ml  Net    629 ml   Filed Weights   10/28/15 2102 10/30/15 2045 10/31/15 2047  Weight: 85.6 kg (188 lb 11.4 oz) 87.907 kg (193 lb 12.8 oz) 84.823 kg (187 lb)    Exam:   General:  AAOx3, no distress  Cardiovascular: S1S2/RRR  Respiratory: Clear today  Abdomen: soft, NT, BS present  Musculoskeletal: trace edema c/c   Data Reviewed: Basic Metabolic Panel:  Recent Labs Lab 10/29/15 0615 10/30/15 0657 10/31/15 0639 11/01/15 0412 11/02/15 0530  NA 139 138 139 138 138  K 3.9 4.0 4.0 3.9 4.1  CL 104 107 107 107 107  CO2 24 21* 19* 20* 18*  GLUCOSE 85  87 81 105* 83  BUN 63* 72* 81* 86* 87*  CREATININE 6.44* 7.13* 7.88* 8.64* 9.48*  CALCIUM 7.5* 7.6* 7.9* 7.7* 7.8*   Liver Function Tests:  Recent Labs Lab 10/27/15 1218  AST 21  ALT 14  ALKPHOS 69  BILITOT 0.3  PROT 5.5*  ALBUMIN 2.5*    Recent Labs Lab 10/27/15 1218  AMYLASE 93   No results for input(s): AMMONIA in the last 168 hours. CBC:  Recent Labs Lab 10/27/15 1218 10/28/15 0901 10/30/15 0657 11/02/15 0530  WBC 9.3 9.5 10.0 9.2  NEUTROABS 5.5 5.4  --   --   HGB 12.4 11.6* 10.9* 11.0*  HCT 36.9 35.1* 33.5* 34.1*  MCV 81.6 81.6 82.5 81.8  PLT 265 263 288 347   Cardiac Enzymes: No results for input(s): CKTOTAL, CKMB, CKMBINDEX, TROPONINI in the last 168 hours. BNP (last 3 results) No results for input(s): BNP in the last 8760 hours.  ProBNP (last 3 results) No results for input(s): PROBNP in the last 8760 hours.  CBG: No results for input(s): GLUCAP in the last 168 hours.  Recent Results (from the past 240 hour(s))  Urine culture     Status: None   Collection Time: 10/27/15 12:05 PM  Result Value Ref Range Status   Colony Count 30,000 COLONIES/ML  Final   Organism ID, Bacteria Multiple bacterial morphotypes present, none  Final   Organism ID, Bacteria predominant. Suggest appropriate recollection if   Final  Organism ID, Bacteria clinically indicated.  Final     Studies: No results found.  Scheduled Meds: . atorvastatin  40 mg Oral Daily  . benzonatate  100 mg Oral BID  . enoxaparin (LOVENOX) injection  30 mg Subcutaneous Q24H  . levofloxacin  500 mg Oral Q48H   Continuous Infusions:   Antibiotics Given (last 72 hours)    Date/Time Action Medication Dose Rate   10/30/15 1632 Given  [due on previous shift]   cefTRIAXone (ROCEPHIN) 1 g in dextrose 5 % 50 mL IVPB 1 g 100 mL/hr   10/31/15 1042 Given   azithromycin (ZITHROMAX) tablet 250 mg 250 mg    10/31/15 1621 Given   cefTRIAXone (ROCEPHIN) 1 g in dextrose 5 % 50 mL IVPB 1 g 100 mL/hr    11/01/15 0949 Given   levofloxacin (LEVAQUIN) tablet 500 mg 500 mg       Principal Problem:   Acute kidney injury ([place]) Active Problems:   Hypertension   Cough   AKI (acute kidney injury) ([place])    Time spent: 29min    Ramsey Guadamuz  [facility] Pager [phone]. If 7PM-7AM, please contact night-coverage at [email or web address], password [facility] 11/02/2015, 1:22 PM  LOS: 4 days

## 2015-11-02 NOTE — Progress Notes (Signed)
Assessment:  1 AKI, hemodynamically mediated due to GI illness, Lotensin on board, ?NSAID and possible immune complex mediated GN(proteinuria, hypoalbuminemia, microhematuria) due to pneumonia or other GN  Plan: 1 Continue support with antibiotics 2 May need renal biopsy for post infx GN/immune complex due to PNA, if this is prolonged recovery or pos serologies 3 ANA, ANCA and anti-GBM are pending  4 Dialysis education 5 Cont conservative mgmt, dialysis prn 6 Furosemide IV x 1 again 7 recheck UA  Subjective: Interval History: mild GERD, no vomiting  Objective: Vital signs in last 24 hours: Temp:  [98.2 F (36.8 C)-98.6 F (37 C)] 98.3 F (36.8 C) (12/04 0852) Pulse Rate:  [50-56] 56 (12/04 0852) Resp:  [18-19] 18 (12/04 0852) BP: (138-146)/(52-65) 146/65 mmHg (12/04 0852) SpO2:  [98 %-100 %] 100 % (12/04 0852) Weight change:   Intake/Output from previous day: 12/03 0701 - 12/04 0700 In: 960 [P.O.:960] Out: 301 [Urine:300; Stool:1] Intake/Output this shift: Total I/O In: 360 [P.O.:360] Out: 100 [Urine:100]  General appearance: alert and cooperative Resp: clear to auscultation bilaterally Chest wall: no tenderness Extremities: edema 3+  Lab Results:  Recent Labs  11/02/15 0530  WBC 9.2  HGB 11.0*  HCT 34.1*  PLT 347   BMET:  Recent Labs  11/01/15 0412 11/02/15 0530  NA 138 138  K 3.9 4.1  CL 107 107  CO2 20* 18*  GLUCOSE 105* 83  BUN 86* 87*  CREATININE 8.64* 9.48*  CALCIUM 7.7* 7.8*   No results for input(s): PTH in the last 72 hours. Iron Studies: No results for input(s): IRON, TIBC, TRANSFERRIN, FERRITIN in the last 72 hours. Studies/Results: No results found.  Scheduled: . atorvastatin  40 mg Oral Daily  . benzonatate  100 mg Oral BID  . enoxaparin (LOVENOX) injection  30 mg Subcutaneous Q24H  . levofloxacin  500 mg Oral Q48H     LOS: 4 days   Lydia Edwards C 11/02/2015,10:34 AM

## 2015-11-03 LAB — MPO/PR-3 (ANCA) ANTIBODIES

## 2015-11-03 LAB — CBC
HCT: 32.1 % — ABNORMAL LOW (ref 36.0–46.0)
Hemoglobin: 10.7 g/dL — ABNORMAL LOW (ref 12.0–15.0)
MCH: 27.1 pg (ref 26.0–34.0)
MCHC: 33.3 g/dL (ref 30.0–36.0)
MCV: 81.3 fL (ref 78.0–100.0)
PLATELETS: 350 10*3/uL (ref 150–400)
RBC: 3.95 MIL/uL (ref 3.87–5.11)
RDW: 13.8 % (ref 11.5–15.5)
WBC: 9.7 10*3/uL (ref 4.0–10.5)

## 2015-11-03 LAB — RENAL FUNCTION PANEL
ALBUMIN: 1.6 g/dL — AB (ref 3.5–5.0)
Anion gap: 13 (ref 5–15)
BUN: 89 mg/dL — AB (ref 6–20)
CALCIUM: 7.8 mg/dL — AB (ref 8.9–10.3)
CO2: 21 mmol/L — ABNORMAL LOW (ref 22–32)
CREATININE: 9.83 mg/dL — AB (ref 0.44–1.00)
Chloride: 106 mmol/L (ref 101–111)
GFR calc Af Amer: 4 mL/min — ABNORMAL LOW (ref >60)
GFR, EST NON AFRICAN AMERICAN: 4 mL/min — AB (ref >60)
GLUCOSE: 90 mg/dL (ref 65–99)
PHOSPHORUS: 10.2 mg/dL — AB (ref 2.5–4.6)
Potassium: 3.9 mmol/L (ref 3.5–5.1)
SODIUM: 140 mmol/L (ref 135–145)

## 2015-11-03 LAB — ANTINUCLEAR ANTIBODIES, IFA: ANA Ab, IFA: NEGATIVE

## 2015-11-03 LAB — GLOMERULAR BASEMENT MEMBRANE ANTIBODIES: GBM AB: 5 U (ref 0–20)

## 2015-11-03 MED ORDER — FUROSEMIDE 10 MG/ML IJ SOLN
200.0000 mg | Freq: Once | INTRAMUSCULAR | Status: AC
  Administered 2015-11-03: 200 mg via INTRAVENOUS
  Filled 2015-11-03 (×2): qty 20

## 2015-11-03 NOTE — Progress Notes (Signed)
[facility] Rounding Note  Subjective: Feels OK other thn some mild nausea She thinks she is making urine more frequently and a little more 100 cc in the hat Only 290 past 24 hours 10 liters + since admission ANA, complements returned - normal ANCA still pending   Objective:  Intake/Output from previous day: 12/04 0701 - 12/05 0700 In: 940 [P.O.:940] Out: 290 [Urine:290] Intake/Output this shift: Total I/O In: 420 [P.O.:420] Out: 200 [Urine:200]  Physical Examination BP 147/68 mmHg  Pulse 60  Temp(Src) 98.2 F (36.8 C) (Oral)  Resp 17  Ht 5\' 3"  (1.6 m)  Wt 84.823 kg (187 lb)  BMI 33.13 kg/m2  SpO2 99%  LMP  General appearance: alert and cooperative No JVD Resp: Lungs clear to auscultation bilaterally Chest wall: no tenderness Extremities: edema 3+ bilateral pitting edema LE's  Lab Results:  Recent Labs  11/02/15 0530 11/03/15 0609  WBC 9.2 9.7  HGB 11.0* 10.7*  HCT 34.1* 32.1*  PLT 347 350   BMET:   Recent Labs  11/02/15 0530 11/03/15 0609  NA 138 140  K 4.1 3.9  CL 107 106  CO2 18* 21*  GLUCOSE 83 90  BUN 87* 89*  CREATININE 9.48* 9.83*  CALCIUM 7.8* 7.8*  UA large Hb, >300 protein, 6-30 RBC (repeat 12/4)  Scheduled: . atorvastatin  40 mg Oral Daily  . benzonatate  100 mg Oral BID  . enoxaparin (LOVENOX) injection  30 mg Subcutaneous Q24H  . levofloxacin  500 mg Oral Q48H   Assessment/Recommendations  Oligoanuric AKI - presumably hemodynamically mediated due to GI illness, volume depletion, ACE on board, NSAID. Kicker is presence of proteinuria and microhematuria raising question of acute GN. ANA and complements normal. ANCA pending. Rate of rise of creatinine MAY be slowing down. No hard indications for dialysis. Not much change in UOP with 200 of lasix yesterday. Have ordered UPC. If significant proteinuria will probably go ahead with renal biopsy.   LOS: 5 days   Mardel Grudzien B 11/03/2015,2:28 PM

## 2015-11-03 NOTE — Progress Notes (Signed)
[facility] PROGRESS NOTE  Lydia Edwards [id number] DOB: Feb 11, 1951 DOA: 10/28/2015 PCP: Alesia Richards, MD  Assessment/Plan: 1. AKI/ ATN, Oliguric -due to dehydration with ACE/Diuretic and NSAID use, unable to rule out immune mediated GN too -Renal US without hydronephrosis -creatinine worsening, was oliguric for 4days (<100cc ) for last 2days making some urine overnight 290cc/24h and 200cc this am -Stopped HCTZ, ACE on admission, hydrated for almost 3d, 8L positive, stopped IVF-3days ago -no urgent indication for HD at this time -Hopeful for renal recovery, baseline creatinine normal -given lasix x1 12/3 and 12/4 per Renal -some symptoms of Uremia  2. CAP -s/p 3days of rocephin and zithromax -supportive care, changed to PO levaquin 12/3 -FU CXR in 4-6weeks  3. PRe DM -stable  4. HTN -stable, ACE/HCTZ on hold  DVT proph: lovenox  Code Status: Full Code Family Communication: none at bedside Disposition Plan: home when creatinine improves   Consultants: Renal  HPI/Subjective: Feels ok, breathing ok, some nausea today, vomited x1 yesterday   Objective: Filed Vitals:   11/03/15 0617 11/03/15 0837  BP: 147/62 147/68  Pulse: 51 60  Temp: 98.8 F (37.1 C) 98.2 F (36.8 C)  Resp: 16 17    Intake/Output Summary (Last 24 hours) at 11/03/15 1426 Last data filed at 11/03/15 1015  Gross per 24 hour  Intake    640 ml  Output    340 ml  Net    300 ml   Filed Weights   10/28/15 2102 10/30/15 2045 10/31/15 2047  Weight: 85.6 kg (188 lb 11.4 oz) 87.907 kg (193 lb 12.8 oz) 84.823 kg (187 lb)    Exam:   General:  AAOx3, no distress  Cardiovascular: S1S2/RRR  Respiratory: Clear today  Abdomen: soft, NT, BS present  Musculoskeletal: trace edema c/c   Data Reviewed: Basic Metabolic Panel:  Recent Labs Lab 10/30/15 0657 10/31/15 0639 11/01/15 0412 11/02/15 0530 11/03/15 0609  NA 138 139 138 138 140  K 4.0 4.0 3.9 4.1 3.9  CL 107 107  107 107 106  CO2 21* 19* 20* 18* 21*  GLUCOSE 87 81 105* 83 90  BUN 72* 81* 86* 87* 89*  CREATININE 7.13* 7.88* 8.64* 9.48* 9.83*  CALCIUM 7.6* 7.9* 7.7* 7.8* 7.8*  PHOS  --   --   --   --  10.2*   Liver Function Tests:  Recent Labs Lab 11/03/15 0609  ALBUMIN 1.6*   No results for input(s): LIPASE, AMYLASE in the last 168 hours. No results for input(s): AMMONIA in the last 168 hours. CBC:  Recent Labs Lab 10/28/15 0901 10/30/15 0657 11/02/15 0530 11/03/15 0609  WBC 9.5 10.0 9.2 9.7  NEUTROABS 5.4  --   --   --   HGB 11.6* 10.9* 11.0* 10.7*  HCT 35.1* 33.5* 34.1* 32.1*  MCV 81.6 82.5 81.8 81.3  PLT 263 288 347 350   Cardiac Enzymes: No results for input(s): CKTOTAL, CKMB, CKMBINDEX, TROPONINI in the last 168 hours. BNP (last 3 results) No results for input(s): BNP in the last 8760 hours.  ProBNP (last 3 results) No results for input(s): PROBNP in the last 8760 hours.  CBG: No results for input(s): GLUCAP in the last 168 hours.  Recent Results (from the past 240 hour(s))  Urine culture     Status: None   Collection Time: 10/27/15 12:05 PM  Result Value Ref Range Status   Colony Count 30,000 COLONIES/ML  Final   Organism ID, Bacteria Multiple bacterial morphotypes present, none  Final  Organism ID, Bacteria predominant. Suggest appropriate recollection if   Final   Organism ID, Bacteria clinically indicated.  Final     Studies: No results found.  Scheduled Meds: . atorvastatin  40 mg Oral Daily  . benzonatate  100 mg Oral BID  . enoxaparin (LOVENOX) injection  30 mg Subcutaneous Q24H  . levofloxacin  500 mg Oral Q48H   Continuous Infusions:   Antibiotics Given (last 72 hours)    Date/Time Action Medication Dose Rate   10/31/15 1621 Given   cefTRIAXone (ROCEPHIN) 1 g in dextrose 5 % 50 mL IVPB 1 g 100 mL/hr   11/01/15 0949 Given   levofloxacin (LEVAQUIN) tablet 500 mg 500 mg    11/03/15 1015 Given   levofloxacin (LEVAQUIN) tablet 500 mg 500 mg        Principal Problem:   Acute kidney injury ([place]) Active Problems:   Hypertension   Cough   AKI (acute kidney injury) ([place])    Time spent: 82min    Taeveon Keesling  [facility] Pager [phone]. If 7PM-7AM, please contact night-coverage at [email or web address], password [facility] 11/03/2015, 2:26 PM  LOS: 5 days

## 2015-11-03 NOTE — Care Management Important Message (Signed)
Important Message  Patient Details  Name: Lydia Edwards MRN: [id number] Date of Birth: December 19, 1950   Medicare Important Message Given:  Yes    Myrtis Maille P Tavaria Mackins 11/03/2015, 12:03 PM

## 2015-11-04 LAB — RENAL FUNCTION PANEL
Albumin: 1.6 g/dL — ABNORMAL LOW (ref 3.5–5.0)
Anion gap: 12 (ref 5–15)
BUN: 92 mg/dL — AB (ref 6–20)
CALCIUM: 7.6 mg/dL — AB (ref 8.9–10.3)
CO2: 20 mmol/L — AB (ref 22–32)
CREATININE: 10.11 mg/dL — AB (ref 0.44–1.00)
Chloride: 105 mmol/L (ref 101–111)
GFR calc non Af Amer: 4 mL/min — ABNORMAL LOW (ref >60)
GFR, EST AFRICAN AMERICAN: 4 mL/min — AB (ref >60)
Glucose, Bld: 97 mg/dL (ref 65–99)
Phosphorus: 10.2 mg/dL — ABNORMAL HIGH (ref 2.5–4.6)
Potassium: 3.8 mmol/L (ref 3.5–5.1)
SODIUM: 137 mmol/L (ref 135–145)

## 2015-11-04 LAB — PROTEIN / CREATININE RATIO, URINE
CREATININE, URINE: 238.02 mg/dL
Protein Creatinine Ratio: 14.21 mg/mg{Cre} — ABNORMAL HIGH (ref 0.00–0.15)
TOTAL PROTEIN, URINE: 3383 mg/dL

## 2015-11-04 LAB — CBC
HCT: 32.5 % — ABNORMAL LOW (ref 36.0–46.0)
Hemoglobin: 11 g/dL — ABNORMAL LOW (ref 12.0–15.0)
MCH: 27.4 pg (ref 26.0–34.0)
MCHC: 33.8 g/dL (ref 30.0–36.0)
MCV: 81 fL (ref 78.0–100.0)
PLATELETS: 351 10*3/uL (ref 150–400)
RBC: 4.01 MIL/uL (ref 3.87–5.11)
RDW: 13.7 % (ref 11.5–15.5)
WBC: 9.5 10*3/uL (ref 4.0–10.5)

## 2015-11-04 NOTE — Progress Notes (Signed)
[facility] Rounding Note  Subjective: Still with nausea UOP MAY be picking up a little and rate of rise of creatinine slowing down All serologic tests negative UPC not sent as ordered - have addressed with nurse   Objective:  Intake/Output from previous day: 12/05 0701 - 12/06 0700 In: 710 [P.O.:660; IV Piggyback:50] Out: 400 [Urine:400] Intake/Output this shift: Total I/O In: 120 [P.O.:120] Out: 250 [Urine:250]  Physical Examination BP 146/69 mmHg  Pulse 65  Temp(Src) 98.6 F (37 C) (Oral)  Resp 18  Ht 5\' 3"  (1.6 m)  Wt 84.6 kg (186 lb 8.2 oz)  BMI 33.05 kg/m2  SpO2 99%  LMP  Puffy, very pleasant No JVD Lungs clear to auscultation bilaterally 3+ bilateral pitting edema LE's  Lab Results:  Recent Labs  11/03/15 0609 11/04/15 0645  WBC 9.7 9.5  HGB 10.7* 11.0*  HCT 32.1* 32.5*  PLT 350 351   BMET:   Recent Labs  11/03/15 0609 11/04/15 0645  NA 140 137  K 3.9 3.8  CL 106 105  CO2 21* 20*  GLUCOSE 90 97  BUN 89* 92*  CREATININE 9.83* 10.11*  CALCIUM 7.8* 7.6*  UA large Hb, >300 protein, 6-30 RBC (repeat 12/4) UPC - not sent  Scheduled: . atorvastatin  40 mg Oral Daily  . benzonatate  100 mg Oral BID  . enoxaparin (LOVENOX) injection  30 mg Subcutaneous Q24H  . levofloxacin  500 mg Oral Q48H   Assessment/Recommendations  Oligoanuric AKI - presumably hemodynamically mediated due to GI illness, volume depletion, ACE on board, NSAID. Kicker is presence of proteinuria and microhematuria raising question of acute GN. ANA and complements normal. ANCA neg. Rate of rise of creatinine MAY be slowing down. No hard indications for dialysis. UPC not sent as ordered (significant proteinuria would prompt renal biopsy) Addressed with RN. Repeat lasix.    LOS: 6 days   Sota Hetz B 11/04/2015,1:41 PM

## 2015-11-04 NOTE — Progress Notes (Signed)
[facility] PROGRESS NOTE  ADMIRE Lydia Edwards [id number] DOB: 09-23-1951 DOA: 10/28/2015 PCP: Alesia Richards, MD   64/F with PMH of preDM, HTN presented to ER with abnormal labs/AKI and pneumonia. -She was in Oliguric ATN for 4days, Renal following -Hydrated for 3days initially , since she was 8-10L positive and hence IVF stopped -Started making some urine in last 2days albeit 300-400cc now -Creatinine still very high but rate of rise improved -Stable from pneumonia standpoint and will complete Abx today  Assessment/Plan: 1. AKI/ ATN, Oliguric -due to dehydration with ACE/Diuretic and NSAID use, unable to rule out immune mediated GN too -Renal US without hydronephrosis -creatinine worsening, was oliguric for 4days (<100cc). In last 2days making more urine overnight 400cc/24h and 250cc this am -Stopped HCTZ, ACE on admission, hydrated for almost 3d, 8-10L positive, stopped IVF-3days ago -no urgent indication for HD at this time -Hopeful for renal recovery, baseline creatinine normal -given lasix x1 12/3 and 12/4 per Renal -mild symptoms of Uremia only -renal to decide regarding Biopsy due to proteinuria  2. CAP -s/p 3days of rocephin and zithromax -supportive care, changed to PO levaquin 12/3, will complete 7days today, stop after todays dose -FU CXR in 4-6weeks  3. PRe DM -stable  4. HTN -stable, ACE/HCTZ on hold  DVT proph: lovenox  Code Status: Full Code Family Communication: none at bedside Disposition Plan: home when creatinine improves   Consultants: Renal  HPI/Subjective: Feels ok, breathing ok, some nausea today, anorexia  Objective: Filed Vitals:   11/04/15 0455 11/04/15 0852  BP: 144/58 146/69  Pulse: 66 65  Temp: 98.1 F (36.7 C) 98.6 F (37 C)  Resp: 18 18    Intake/Output Summary (Last 24 hours) at 11/04/15 1310 Last data filed at 11/04/15 1041  Gross per 24 hour  Intake    410 ml  Output    450 ml  Net    -40 ml   Filed  Weights   10/30/15 2045 10/31/15 2047 11/04/15 0455  Weight: 87.907 kg (193 lb 12.8 oz) 84.823 kg (187 lb) 84.6 kg (186 lb 8.2 oz)    Exam:   General:  AAOx3, no distress  Cardiovascular: S1S2/RRR  Respiratory: Clear today  Abdomen: soft, NT, BS present  Musculoskeletal: trace edema c/c   Data Reviewed: Basic Metabolic Panel:  Recent Labs Lab 10/31/15 0639 11/01/15 0412 11/02/15 0530 11/03/15 0609 11/04/15 0645  NA 139 138 138 140 137  K 4.0 3.9 4.1 3.9 3.8  CL 107 107 107 106 105  CO2 19* 20* 18* 21* 20*  GLUCOSE 81 105* 83 90 97  BUN 81* 86* 87* 89* 92*  CREATININE 7.88* 8.64* 9.48* 9.83* 10.11*  CALCIUM 7.9* 7.7* 7.8* 7.8* 7.6*  PHOS  --   --   --  10.2* 10.2*   Liver Function Tests:  Recent Labs Lab 11/03/15 0609 11/04/15 0645  ALBUMIN 1.6* 1.6*   No results for input(s): LIPASE, AMYLASE in the last 168 hours. No results for input(s): AMMONIA in the last 168 hours. CBC:  Recent Labs Lab 10/30/15 0657 11/02/15 0530 11/03/15 0609 11/04/15 0645  WBC 10.0 9.2 9.7 9.5  HGB 10.9* 11.0* 10.7* 11.0*  HCT 33.5* 34.1* 32.1* 32.5*  MCV 82.5 81.8 81.3 81.0  PLT 288 347 350 351   Cardiac Enzymes: No results for input(s): CKTOTAL, CKMB, CKMBINDEX, TROPONINI in the last 168 hours. BNP (last 3 results) No results for input(s): BNP in the last 8760 hours.  ProBNP (last 3 results) No results for  input(s): PROBNP in the last 8760 hours.  CBG: No results for input(s): GLUCAP in the last 168 hours.  Recent Results (from the past 240 hour(s))  Urine culture     Status: None   Collection Time: 10/27/15 12:05 PM  Result Value Ref Range Status   Colony Count 30,000 COLONIES/ML  Final   Organism ID, Bacteria Multiple bacterial morphotypes present, none  Final   Organism ID, Bacteria predominant. Suggest appropriate recollection if   Final   Organism ID, Bacteria clinically indicated.  Final     Studies: No results found.  Scheduled Meds: . atorvastatin   40 mg Oral Daily  . benzonatate  100 mg Oral BID  . enoxaparin (LOVENOX) injection  30 mg Subcutaneous Q24H  . levofloxacin  500 mg Oral Q48H   Continuous Infusions:   Antibiotics Given (last 72 hours)    Date/Time Action Medication Dose   11/03/15 1015 Given   levofloxacin (LEVAQUIN) tablet 500 mg 500 mg      Principal Problem:   Acute kidney injury ([place]) Active Problems:   Hypertension   Cough   AKI (acute kidney injury) ([place])    Time spent: 25min    Bertram Haddix  [facility] Pager [phone]. If 7PM-7AM, please contact night-coverage at [email or web address], password [facility] 11/04/2015, 1:10 PM  LOS: 6 days

## 2015-11-05 ENCOUNTER — Inpatient Hospital Stay (HOSPITAL_COMMUNITY): Payer: Commercial Managed Care - HMO

## 2015-11-05 ENCOUNTER — Encounter (HOSPITAL_COMMUNITY): Payer: Self-pay

## 2015-11-05 LAB — CBC
HCT: 32.9 % — ABNORMAL LOW (ref 36.0–46.0)
Hemoglobin: 10.7 g/dL — ABNORMAL LOW (ref 12.0–15.0)
MCH: 26.5 pg (ref 26.0–34.0)
MCHC: 32.5 g/dL (ref 30.0–36.0)
MCV: 81.4 fL (ref 78.0–100.0)
PLATELETS: 348 10*3/uL (ref 150–400)
RBC: 4.04 MIL/uL (ref 3.87–5.11)
RDW: 13.7 % (ref 11.5–15.5)
WBC: 10.2 10*3/uL (ref 4.0–10.5)

## 2015-11-05 LAB — BASIC METABOLIC PANEL
Anion gap: 15 (ref 5–15)
BUN: 93 mg/dL — AB (ref 6–20)
CALCIUM: 7.7 mg/dL — AB (ref 8.9–10.3)
CO2: 18 mmol/L — ABNORMAL LOW (ref 22–32)
CREATININE: 10.17 mg/dL — AB (ref 0.44–1.00)
Chloride: 104 mmol/L (ref 101–111)
GFR calc Af Amer: 4 mL/min — ABNORMAL LOW (ref >60)
GFR, EST NON AFRICAN AMERICAN: 4 mL/min — AB (ref >60)
GLUCOSE: 91 mg/dL (ref 65–99)
Potassium: 3.9 mmol/L (ref 3.5–5.1)
Sodium: 137 mmol/L (ref 135–145)

## 2015-11-05 MED ORDER — HEPARIN SODIUM (PORCINE) 1000 UNIT/ML IJ SOLN
INTRAMUSCULAR | Status: AC
  Filled 2015-11-05: qty 1

## 2015-11-05 MED ORDER — LIDOCAINE HCL 1 % IJ SOLN
INTRAMUSCULAR | Status: AC
  Filled 2015-11-05: qty 20

## 2015-11-05 NOTE — Progress Notes (Signed)
[facility] PROGRESS NOTE  CALLAHAN CROSSETT [id number] DOB: Nov 10, 1951 DOA: 10/28/2015 PCP: Alesia Richards, MD   64/F with PMH of preDM, HTN presented to ER with abnormal labs/AKI and pneumonia. -She was in Oliguric ATN for 4days, Renal following -Hydrated for 3days initially , since she was 8-10L positive and hence IVF stopped -Started making some urine in last 2days albeit 300-400cc now -Creatinine still very high but rate of rise improved -Stable from pneumonia standpoint and will complete Abx today  Assessment/Plan: 1. AKI/ ATN, Oliguric -due to dehydration with ACE/Diuretic and NSAID use, unable to rule out immune mediated GN too -Renal US without hydronephrosis -creatinine worsening, was oliguric for 4days, started having urine output.  -Stopped HCTZ, ACE on admission, hydrated for almost 3d, 8-10L positive, stopped IVF-3days ago - currently fluid overloaded and starte do lasix.  - not responding much, but at the same time hasn't worsened.  - plan for HD as per renal and renal biopsy on Friday.   2. CAP -completed the treatment for pneumonia.  -FU CXR in 4-6weeks  3. PRe DM -stable -CBG (last 3)  No results for input(s): GLUCAP in the last 72 hours.    4. HTN -stable, ACE/HCTZ on hold  DVT proph: lovenox  Code Status: Full Code Family Communication: none at bedside Disposition Plan: home when creatinine improves   Consultants: Renal  HPI/Subjective: Reports gagging with food and vomiting. Nauseated.   Objective: Filed Vitals:   11/05/15 1732 11/05/15 1807  BP: 140/72 143/70  Pulse: 60 53  Temp: 98.3 F (36.8 C) 98.3 F (36.8 C)  Resp: 18 18    Intake/Output Summary (Last 24 hours) at 11/05/15 1849 Last data filed at 11/05/15 1703  Gross per 24 hour  Intake    560 ml  Output    601 ml  Net    -41 ml   Filed Weights   10/31/15 2047 11/04/15 0455 11/04/15 2035  Weight: 84.823 kg (187 lb) 84.6 kg (186 lb 8.2 oz) 91.989 kg (202 lb  12.8 oz)    Exam:   General:  AAOx3, no distress  Cardiovascular: S1S2/RRR  Respiratory: Clear today  Abdomen: soft, NT, BS present  Musculoskeletal: trace edema c/c   Data Reviewed: Basic Metabolic Panel:  Recent Labs Lab 11/01/15 0412 11/02/15 0530 11/03/15 0609 11/04/15 0645 11/05/15 0503  NA 138 138 140 137 137  K 3.9 4.1 3.9 3.8 3.9  CL 107 107 106 105 104  CO2 20* 18* 21* 20* 18*  GLUCOSE 105* 83 90 97 91  BUN 86* 87* 89* 92* 93*  CREATININE 8.64* 9.48* 9.83* 10.11* 10.17*  CALCIUM 7.7* 7.8* 7.8* 7.6* 7.7*  PHOS  --   --  10.2* 10.2*  --    Liver Function Tests:  Recent Labs Lab 11/03/15 0609 11/04/15 0645  ALBUMIN 1.6* 1.6*   No results for input(s): LIPASE, AMYLASE in the last 168 hours. No results for input(s): AMMONIA in the last 168 hours. CBC:  Recent Labs Lab 10/30/15 0657 11/02/15 0530 11/03/15 0609 11/04/15 0645 11/05/15 0503  WBC 10.0 9.2 9.7 9.5 10.2  HGB 10.9* 11.0* 10.7* 11.0* 10.7*  HCT 33.5* 34.1* 32.1* 32.5* 32.9*  MCV 82.5 81.8 81.3 81.0 81.4  PLT 288 347 350 351 348   Cardiac Enzymes: No results for input(s): CKTOTAL, CKMB, CKMBINDEX, TROPONINI in the last 168 hours. BNP (last 3 results) No results for input(s): BNP in the last 8760 hours.  ProBNP (last 3 results) No results for input(s): PROBNP  in the last 8760 hours.  CBG: No results for input(s): GLUCAP in the last 168 hours.  Recent Results (from the past 240 hour(s))  Urine culture     Status: None   Collection Time: 10/27/15 12:05 PM  Result Value Ref Range Status   Colony Count 30,000 COLONIES/ML  Final   Organism ID, Bacteria Multiple bacterial morphotypes present, none  Final   Organism ID, Bacteria predominant. Suggest appropriate recollection if   Final   Organism ID, Bacteria clinically indicated.  Final     Studies: Ir Fluoro Guide Cv Line Right  11/05/2015  CLINICAL DATA:  Acute renal insufficiency, needs venous access for hemodialysis EXAM: EXAM  RIGHT IJ CATHETER PLACEMENT UNDER ULTRASOUND AND FLUOROSCOPIC GUIDANCE TECHNIQUE: The procedure, risks (including but not limited to bleeding, infection, organ damage, pneumothorax), benefits, and alternatives were explained to the patient. Questions regarding the procedure were encouraged and answered. The patient understands and consents to the procedure. Patency of the right IJ vein was confirmed with ultrasound with image documentation. An appropriate skin site was determined. Skin site was marked. Region was prepped using maximum barrier technique including cap and mask, sterile gown, sterile gloves, large sterile sheet, and Chlorhexidine as cutaneous antisepsis. The region was infiltrated locally with 1% lidocaine. Under real-time ultrasound guidance, the right IJ vein was accessed with a 19 gauge needle; the needle tip within the vein was confirmed with ultrasound image documentation. The needle exchanged over a guidewire for vascular dilator which allowed advancement of a 20 cm Trialysis catheter. This was positioned with the tip at the cavoatrial junction. Spot chest radiograph shows good positioning and no pneumothorax. Catheter was flushed and sutured externally with 0-Prolene sutures. Patient tolerated the procedure well. FLUOROSCOPY TIME:  Less than 6 seconds, 1.9 uGym2 DAP COMPLICATIONS: COMPLICATIONS none IMPRESSION: 1. Technically successful right IJ Trialysis catheter placement. Electronically Signed   By: Lucrezia Europe M.D.   On: 11/05/2015 16:52   Ir US Guide Vasc Access Right  11/05/2015  CLINICAL DATA:  Acute renal insufficiency, needs venous access for hemodialysis EXAM: EXAM RIGHT IJ CATHETER PLACEMENT UNDER ULTRASOUND AND FLUOROSCOPIC GUIDANCE TECHNIQUE: The procedure, risks (including but not limited to bleeding, infection, organ damage, pneumothorax), benefits, and alternatives were explained to the patient. Questions regarding the procedure were encouraged and answered. The patient  understands and consents to the procedure. Patency of the right IJ vein was confirmed with ultrasound with image documentation. An appropriate skin site was determined. Skin site was marked. Region was prepped using maximum barrier technique including cap and mask, sterile gown, sterile gloves, large sterile sheet, and Chlorhexidine as cutaneous antisepsis. The region was infiltrated locally with 1% lidocaine. Under real-time ultrasound guidance, the right IJ vein was accessed with a 19 gauge needle; the needle tip within the vein was confirmed with ultrasound image documentation. The needle exchanged over a guidewire for vascular dilator which allowed advancement of a 20 cm Trialysis catheter. This was positioned with the tip at the cavoatrial junction. Spot chest radiograph shows good positioning and no pneumothorax. Catheter was flushed and sutured externally with 0-Prolene sutures. Patient tolerated the procedure well. FLUOROSCOPY TIME:  Less than 6 seconds, 1.9 uGym2 DAP COMPLICATIONS: COMPLICATIONS none IMPRESSION: 1. Technically successful right IJ Trialysis catheter placement. Electronically Signed   By: Lucrezia Europe M.D.   On: 11/05/2015 16:52    Scheduled Meds: . atorvastatin  40 mg Oral Daily  . benzonatate  100 mg Oral BID  . enoxaparin (LOVENOX) injection  30 mg  Subcutaneous Q24H  . heparin      . lidocaine       Continuous Infusions:   Antibiotics Given (last 72 hours)    Date/Time Action Medication Dose   11/03/15 1015 Given   levofloxacin (LEVAQUIN) tablet 500 mg 500 mg   11/05/15 1028 Given   levofloxacin (LEVAQUIN) tablet 500 mg 500 mg      Principal Problem:   Acute kidney injury ([place]) Active Problems:   Hypertension   Cough   AKI (acute kidney injury) ([place])    Time spent: 75min    [facility]  [facility] Pager [phone] 7PM-7AM, please contact night-coverage at [email or web address], password [facility] 11/05/2015, 6:49 PM  LOS: 7 days

## 2015-11-05 NOTE — Progress Notes (Signed)
[facility] Rounding Note  Subjective: Minimal UOP (no response to repeated doses high dose lasix) Still nauseous Legs "tight  Objective:  Intake/Output from previous day: 12/06 0701 - 12/07 0700 In: 240 [P.O.:240] Out: 600 [Urine:600] Intake/Output this shift: Total I/O In: 340 [P.O.:340] Out: 150 [Urine:150]  Physical Examination BP 149/60 mmHg  Pulse 51  Temp(Src) 98.4 F (36.9 C) (Oral)  Resp 18  Ht 5\' 3"  (1.6 m)  Wt 91.989 kg (202 lb 12.8 oz)  BMI 35.93 kg/m2  SpO2 99%  LMP  Very pleasant Sitting up in the chair Generalized anasarca No JVD Lungs clear to auscultation bilaterally 3+ bilateral pitting edema LE's  Lab Results:  Recent Labs  11/04/15 0645 11/05/15 0503  WBC 9.5 10.2  HGB 11.0* 10.7*  HCT 32.5* 32.9*  PLT 351 348   BMET:   Recent Labs  11/04/15 0645 11/05/15 0503  NA 137 137  K 3.8 3.9  CL 105 104  CO2 20* 18*  GLUCOSE 97 91  BUN 92* 93*  CREATININE 10.11* 10.17*  CALCIUM 7.6* 7.7*   UA large Hb, >300 protein, 6-30 RBC (repeated 12/4) UPC - 14.1 grams  Scheduled Medications: . atorvastatin  40 mg Oral Daily  . benzonatate  100 mg Oral BID  . enoxaparin (LOVENOX) injection  30 mg Subcutaneous Q24H   Assessment/Recommendations  Oligoanuric AKI  Baseline creatinine 0.78 10/16/15 - 4.92 on presentation - now up to 10 Working dx was hemodynamically mediated AKI due to GI illness, volume depletion, ACE on board, NSAID. She did not turn around with hydration and is now markedly volume overloaded/diuretic unresponsive  Kicker is presence of proteinuria and microhematuria raising question of acute GN.  ANA and complements normal. ANCA neg.  UPC suggests 14 grams of proteinuria No response to high dose lasix 1. Place temp HD cath (IR to place) and dialyze today and tomorrow 2. Arrange for percutaneous renal biopsy on Friday for diagnosis 3. Hold lovenox after today's dose  CAP  Completed rocephin/azithro and  now on po levaquin  HTN Good right now without meds       LOS: 7 days   Shalyn Koral B 11/05/2015,12:53 PM

## 2015-11-05 NOTE — Procedures (Signed)
R IJ Trialysis 20cm catheter placed No complication No blood loss. See complete dictation in [facility].

## 2015-11-06 ENCOUNTER — Encounter (HOSPITAL_COMMUNITY): Payer: Self-pay | Admitting: Radiology

## 2015-11-06 LAB — RENAL FUNCTION PANEL
ANION GAP: 12 (ref 5–15)
Albumin: 1.6 g/dL — ABNORMAL LOW (ref 3.5–5.0)
Albumin: 1.7 g/dL — ABNORMAL LOW (ref 3.5–5.0)
Anion gap: 12 (ref 5–15)
BUN: 68 mg/dL — AB (ref 6–20)
BUN: 69 mg/dL — ABNORMAL HIGH (ref 6–20)
CHLORIDE: 104 mmol/L (ref 101–111)
CO2: 21 mmol/L — AB (ref 22–32)
CO2: 23 mmol/L (ref 22–32)
CREATININE: 8.92 mg/dL — AB (ref 0.44–1.00)
Calcium: 7.6 mg/dL — ABNORMAL LOW (ref 8.9–10.3)
Calcium: 8 mg/dL — ABNORMAL LOW (ref 8.9–10.3)
Chloride: 104 mmol/L (ref 101–111)
Creatinine, Ser: 9.27 mg/dL — ABNORMAL HIGH (ref 0.44–1.00)
GFR calc Af Amer: 5 mL/min — ABNORMAL LOW (ref >60)
GFR calc Af Amer: 5 mL/min — ABNORMAL LOW (ref >60)
GFR calc non Af Amer: 4 mL/min — ABNORMAL LOW (ref >60)
GFR, EST NON AFRICAN AMERICAN: 4 mL/min — AB (ref >60)
GLUCOSE: 96 mg/dL (ref 65–99)
Glucose, Bld: 96 mg/dL (ref 65–99)
POTASSIUM: 3.8 mmol/L (ref 3.5–5.1)
Phosphorus: 9.1 mg/dL — ABNORMAL HIGH (ref 2.5–4.6)
Phosphorus: 9.1 mg/dL — ABNORMAL HIGH (ref 2.5–4.6)
Potassium: 3.8 mmol/L (ref 3.5–5.1)
SODIUM: 137 mmol/L (ref 135–145)
SODIUM: 139 mmol/L (ref 135–145)

## 2015-11-06 LAB — CBC
HEMATOCRIT: 34.8 % — AB (ref 36.0–46.0)
HEMOGLOBIN: 11.6 g/dL — AB (ref 12.0–15.0)
MCH: 27.2 pg (ref 26.0–34.0)
MCHC: 33.3 g/dL (ref 30.0–36.0)
MCV: 81.5 fL (ref 78.0–100.0)
Platelets: 343 10*3/uL (ref 150–400)
RBC: 4.27 MIL/uL (ref 3.87–5.11)
RDW: 13.7 % (ref 11.5–15.5)
WBC: 12.8 10*3/uL — ABNORMAL HIGH (ref 4.0–10.5)

## 2015-11-06 LAB — HEPATITIS B SURFACE ANTIGEN: Hepatitis B Surface Ag: NEGATIVE

## 2015-11-06 LAB — HEPATITIS B SURFACE ANTIBODY,QUALITATIVE: Hep B S Ab: NONREACTIVE

## 2015-11-06 LAB — HEPATITIS B CORE ANTIBODY, TOTAL: Hep B Core Total Ab: NEGATIVE

## 2015-11-06 NOTE — Procedures (Signed)
Patient was seen on dialysis and the procedure was supervised.  BFR 200  Via PC BP is  136/77.   Patient appears to be tolerating treatment well- second tx  Lydia Edwards A 11/06/2015

## 2015-11-06 NOTE — Consult Note (Signed)
Chief Complaint: Patient was seen in consultation today for random renal biopsy Chief Complaint  Patient presents with  . Dehydration   at the request of Dr Lorrene Reid  Referring Physician(s): Dr Lorrene Reid  History of Present Illness: Lydia Edwards is a 64 y.o. female   Pt with worsening renal function Thought secondary to GI illness and NSAIDs; ACE Hydration without good response Oligo anuric; acute renal injury Noted proteinuria and micro hematuria Request for random renal biopsy per Dr Lorrene Reid Hemodialysis started 12/7   Past Medical History  Diagnosis Date  . Hyperlipidemia   . Hypertension   . Vitamin D deficiency   . Prediabetes     Past Surgical History  Procedure Laterality Date  . Abdominal hysterectomy      Allergies: Naproxen and Nsaids  Medications: Prior to Admission medications   Medication Sig Start Date End Date Taking? Authorizing Provider  acetaminophen (TYLENOL) 500 MG tablet Take 500 mg by mouth every 6 (six) hours as needed for mild pain or headache.    Yes Historical Provider, MD  atorvastatin (LIPITOR) 40 MG tablet Take 1 tablet (40 mg total) by mouth daily. 02/03/15  Yes Courtney Forcucci, PA-C  benazepril (LOTENSIN) 40 MG tablet Take 1 tablet (40 mg total) by mouth daily. To last til next appointment 02/03/15 09/10/15  Loma Sousa Forcucci, PA-C  benazepril (LOTENSIN) 40 MG tablet Take 40 mg by mouth daily.   Yes Historical Provider, MD  Esomeprazole Magnesium (NEXIUM PO) Take 1 capsule by mouth daily.   Yes Historical Provider, MD  hydrochlorothiazide (HYDRODIURIL) 25 MG tablet Take 1 tablet (25 mg total) by mouth daily. 10/06/15 05/12/16 Yes Vicie Mutters, PA-C  promethazine (PHENERGAN) 25 MG tablet Take 1 tablet (25 mg total) by mouth every 6 (six) hours as needed for nausea or vomiting. 10/27/15   Vicie Mutters, PA-C     Family History  Problem Relation Age of Onset  . Stroke Sister   . Hypertension Sister   . Cancer Sister   . Heart attack  Brother   . Colon cancer Neg Hx     Social History   Social History  . Marital Status: Married    Spouse Name: N/A  . Number of Children: N/A  . Years of Education: N/A   Social History Main Topics  . Smoking status: Never Smoker   . Smokeless tobacco: Never Used  . Alcohol Use: No  . Drug Use: No  . Sexual Activity: No   Other Topics Concern  . None   Social History Narrative     Review of Systems: A 12 point ROS discussed and pertinent positives are indicated in the HPI above.  All other systems are negative.  Review of Systems  Constitutional: Positive for activity change and fatigue. Negative for fever.  Respiratory: Positive for shortness of breath. Negative for cough.   Gastrointestinal: Negative for abdominal pain.  Genitourinary: Positive for decreased urine volume.  Neurological: Positive for weakness.  Psychiatric/Behavioral: Negative for behavioral problems and confusion.    Vital Signs: BP 134/71 mmHg  Pulse 66  Temp(Src) 98.9 F (37.2 C) (Oral)  Resp 17  Ht 5\' 3"  (1.6 m)  Wt 197 lb 12 oz (89.7 kg)  BMI 35.04 kg/m2  SpO2 100%  LMP   Physical Exam  Constitutional: She is oriented to person, place, and time.  Cardiovascular: Normal rate, regular rhythm and normal heart sounds.   Pulmonary/Chest: Effort normal and breath sounds normal. She has no wheezes.  Abdominal: Soft.  Bowel sounds are normal. There is no tenderness.  Musculoskeletal: Normal range of motion.  Neurological: She is alert and oriented to person, place, and time.  Skin: Skin is warm and dry.  Psychiatric: She has a normal mood and affect. Her behavior is normal. Judgment and thought content normal.  Nursing note and vitals reviewed.   Mallampati Score:  MD Evaluation Airway: WNL Heart: WNL Abdomen: WNL Chest/ Lungs: WNL ASA  Classification: 3 Mallampati/Airway Score: Two  Imaging: Dg Chest 2 View  10/28/2015  CLINICAL DATA:  Cough and nausea.  Vomiting. EXAM: CHEST -  2 VIEW COMPARISON:  Two-view chest x-ray 10/05/2011 FINDINGS: The heart is enlarged. The left lower lobe pneumonia is knee. A small effusion is associated. There is no edema to suggest failure. No other focal airspace disease is present. The visualized soft tissues and bony thorax are unremarkable. IMPRESSION: 1. New left lower lobe pneumonia. 2. Cardiomegaly without failure. Electronically Signed   By: San Morelle M.D.   On: 10/28/2015 13:06   US Renal  10/29/2015  CLINICAL DATA:  Acute kidney insufficiency EXAM: RENAL / URINARY TRACT ULTRASOUND COMPLETE COMPARISON:  10/30/2009 CT scan of the abdomen and pelvis FINDINGS: Right Kidney: Length: 12.8 cm. There is increased echogenicity suspicious for medical renal disease. No hydronephrosis or renal calculus. Left Kidney: Length: 14.5 cm. Increased cortical echogenicity suspicious for medical renal disease. No hydronephrosis or renal calculus. Bladder: decompressed with Foley catheter. IMPRESSION: 1. Bilateral echogenic kidneys suspicious for medical renal disease. No hydronephrosis or renal calculus. Electronically Signed   By: Lahoma Crocker M.D.   On: 10/29/2015 16:50   Ir Fluoro Guide Cv Line Right  11/05/2015  CLINICAL DATA:  Acute renal insufficiency, needs venous access for hemodialysis EXAM: EXAM RIGHT IJ CATHETER PLACEMENT UNDER ULTRASOUND AND FLUOROSCOPIC GUIDANCE TECHNIQUE: The procedure, risks (including but not limited to bleeding, infection, organ damage, pneumothorax), benefits, and alternatives were explained to the patient. Questions regarding the procedure were encouraged and answered. The patient understands and consents to the procedure. Patency of the right IJ vein was confirmed with ultrasound with image documentation. An appropriate skin site was determined. Skin site was marked. Region was prepped using maximum barrier technique including cap and mask, sterile gown, sterile gloves, large sterile sheet, and Chlorhexidine as cutaneous  antisepsis. The region was infiltrated locally with 1% lidocaine. Under real-time ultrasound guidance, the right IJ vein was accessed with a 19 gauge needle; the needle tip within the vein was confirmed with ultrasound image documentation. The needle exchanged over a guidewire for vascular dilator which allowed advancement of a 20 cm Trialysis catheter. This was positioned with the tip at the cavoatrial junction. Spot chest radiograph shows good positioning and no pneumothorax. Catheter was flushed and sutured externally with 0-Prolene sutures. Patient tolerated the procedure well. FLUOROSCOPY TIME:  Less than 6 seconds, 1.9 uGym2 DAP COMPLICATIONS: COMPLICATIONS none IMPRESSION: 1. Technically successful right IJ Trialysis catheter placement. Electronically Signed   By: Lucrezia Europe M.D.   On: 11/05/2015 16:52   Ir US Guide Vasc Access Right  11/05/2015  CLINICAL DATA:  Acute renal insufficiency, needs venous access for hemodialysis EXAM: EXAM RIGHT IJ CATHETER PLACEMENT UNDER ULTRASOUND AND FLUOROSCOPIC GUIDANCE TECHNIQUE: The procedure, risks (including but not limited to bleeding, infection, organ damage, pneumothorax), benefits, and alternatives were explained to the patient. Questions regarding the procedure were encouraged and answered. The patient understands and consents to the procedure. Patency of the right IJ vein was confirmed with ultrasound with image documentation.  An appropriate skin site was determined. Skin site was marked. Region was prepped using maximum barrier technique including cap and mask, sterile gown, sterile gloves, large sterile sheet, and Chlorhexidine as cutaneous antisepsis. The region was infiltrated locally with 1% lidocaine. Under real-time ultrasound guidance, the right IJ vein was accessed with a 19 gauge needle; the needle tip within the vein was confirmed with ultrasound image documentation. The needle exchanged over a guidewire for vascular dilator which allowed advancement  of a 20 cm Trialysis catheter. This was positioned with the tip at the cavoatrial junction. Spot chest radiograph shows good positioning and no pneumothorax. Catheter was flushed and sutured externally with 0-Prolene sutures. Patient tolerated the procedure well. FLUOROSCOPY TIME:  Less than 6 seconds, 1.9 uGym2 DAP COMPLICATIONS: COMPLICATIONS none IMPRESSION: 1. Technically successful right IJ Trialysis catheter placement. Electronically Signed   By: Lucrezia Europe M.D.   On: 11/05/2015 16:52    Labs:  CBC:  Recent Labs  11/02/15 0530 11/03/15 0609 11/04/15 0645 11/05/15 0503  WBC 9.2 9.7 9.5 10.2  HGB 11.0* 10.7* 11.0* 10.7*  HCT 34.1* 32.1* 32.5* 32.9*  PLT 347 350 351 348    COAGS: No results for input(s): INR, APTT in the last 8760 hours.  BMP:  Recent Labs  11/03/15 0609 11/04/15 0645 11/05/15 0503 11/06/15 0640  NA 140 137 137 137  K 3.9 3.8 3.9 3.8  CL 106 105 104 104  CO2 21* 20* 18* 21*  GLUCOSE 90 97 91 96  BUN 89* 92* 93* 68*  CALCIUM 7.8* 7.6* 7.7* 7.6*  CREATININE 9.83* 10.11* 10.17* 8.92*  GFRNONAA 4* 4* 4* 4*  GFRAA 4* 4* 4* 5*    LIVER FUNCTION TESTS:  Recent Labs  02/03/15 1339 06/16/15 1243 10/06/15 0957 10/27/15 1218 11/03/15 0609 11/04/15 0645 11/06/15 0640  BILITOT 0.6 0.4 0.5 0.3  --   --   --   AST 23 24 16 21   --   --   --   ALT 20 20 12 14   --   --   --   ALKPHOS 55 49 54 69  --   --   --   PROT 7.5 7.3 6.6 5.5*  --   --   --   ALBUMIN 4.5 4.0 3.7 2.5* 1.6* 1.6* 1.6*    TUMOR MARKERS: No results for input(s): AFPTM, CEA, CA199, CHROMGRNA in the last 8760 hours.  Assessment and Plan:  Rising creatinine AKI Oligo anuric Proteinuria; hematuria BP stable Scheduled for random renal biopsy Risks and Benefits discussed with the patient including, but not limited to bleeding, infection, damage to adjacent structures or low yield requiring additional tests. All of the patient's questions were answered, patient is agreeable to  proceed. Consent signed and in chart.   Thank you for this interesting consult.  I greatly enjoyed meeting Lydia Edwards and look forward to participating in their care.  A copy of this report was sent to the requesting provider on this date.  Signed: Tanessa Tidd A 11/06/2015, 1:44 PM   I spent a total of 20 Minutes    in face to face in clinical consultation, greater than 50% of which was counseling/coordinating care for random renal bx

## 2015-11-06 NOTE — Progress Notes (Signed)
[facility] PROGRESS NOTE  CANDINA SOTTO [id number] DOB: 1951-04-04 DOA: 10/28/2015 PCP: Alesia Richards, MD   64/F with PMH of preDM, HTN presented to ER with abnormal labs/AKI and pneumonia. -She was in Oliguric ATN for 4days, Renal following -Hydrated for 3days initially , since she was 8-10L positive and hence IVF stopped -Started making some urine in last 2days albeit 300-400cc now -Creatinine still very high but rate of rise improved -Stable from pneumonia standpoint and will completed antibiotics.  Assessment/Plan: 1. AKI/ ATN, Oliguric -due to dehydration with ACE/Diuretic and NSAID use, unable to rule out immune mediated GN too -Renal US without hydronephrosis -creatinine worsening, was oliguric for 4days, started having urine output.  -Stopped HCTZ, ACE on admission, hydrated for almost 3d, 8-10L positive, stopped IVF-3days ago - currently fluid overloaded and started on lasix.  - not responding much, but at the same time hasn't worsened.  - plan for HD as per renal on 12/7 and 12/8 and renal biopsy on Friday.   2. CAP -completed the treatment for pneumonia.  -FU CXR in 4-6weeks  3. PRe DM -stable -CBG (last 3)  No results for input(s): GLUCAP in the last 72 hours.    4. HTN -stable, ACE/HCTZ on hold  DVT proph: lovenox  Code Status: Full Code Family Communication: none at bedside Disposition Plan: home when creatinine improves   Consultants: Renal  HPI/Subjective: No vomiting , no cough.   Objective: Filed Vitals:   11/06/15 1600 11/06/15 1630  BP: 133/75 134/82  Pulse: 53 57  Temp:    Resp: 14 26    Intake/Output Summary (Last 24 hours) at 11/06/15 1812 Last data filed at 11/06/15 1343  Gross per 24 hour  Intake    580 ml  Output   3079 ml  Net  -2499 ml   Filed Weights   11/05/15 2115 11/05/15 2147 11/06/15 1416  Weight: 90.5 kg (199 lb 8.3 oz) 89.7 kg (197 lb 12 oz) 86.7 kg (191 lb 2.2 oz)    Exam:   General:   AAOx3, no distress  Cardiovascular: S1S2/RRR  Respiratory: Clear today  Abdomen: soft, NT, BS present  Musculoskeletal: trace edema c/c   Data Reviewed: Basic Metabolic Panel:  Recent Labs Lab 11/02/15 0530 11/03/15 0609 11/04/15 0645 11/05/15 0503 11/06/15 0640  NA 138 140 137 137 137  K 4.1 3.9 3.8 3.9 3.8  CL 107 106 105 104 104  CO2 18* 21* 20* 18* 21*  GLUCOSE 83 90 97 91 96  BUN 87* 89* 92* 93* 68*  CREATININE 9.48* 9.83* 10.11* 10.17* 8.92*  CALCIUM 7.8* 7.8* 7.6* 7.7* 7.6*  PHOS  --  10.2* 10.2*  --  9.1*   Liver Function Tests:  Recent Labs Lab 11/03/15 0609 11/04/15 0645 11/06/15 0640  ALBUMIN 1.6* 1.6* 1.6*   No results for input(s): LIPASE, AMYLASE in the last 168 hours. No results for input(s): AMMONIA in the last 168 hours. CBC:  Recent Labs Lab 11/02/15 0530 11/03/15 0609 11/04/15 0645 11/05/15 0503  WBC 9.2 9.7 9.5 10.2  HGB 11.0* 10.7* 11.0* 10.7*  HCT 34.1* 32.1* 32.5* 32.9*  MCV 81.8 81.3 81.0 81.4  PLT 347 350 351 348   Cardiac Enzymes: No results for input(s): CKTOTAL, CKMB, CKMBINDEX, TROPONINI in the last 168 hours. BNP (last 3 results) No results for input(s): BNP in the last 8760 hours.  ProBNP (last 3 results) No results for input(s): PROBNP in the last 8760 hours.  CBG: No results for input(s): GLUCAP  in the last 168 hours.  No results found for this or any previous visit (from the past 240 hour(s)).   Studies: Ir Fluoro Guide Cv Line Right  11/05/2015  CLINICAL DATA:  Acute renal insufficiency, needs venous access for hemodialysis EXAM: EXAM RIGHT IJ CATHETER PLACEMENT UNDER ULTRASOUND AND FLUOROSCOPIC GUIDANCE TECHNIQUE: The procedure, risks (including but not limited to bleeding, infection, organ damage, pneumothorax), benefits, and alternatives were explained to the patient. Questions regarding the procedure were encouraged and answered. The patient understands and consents to the procedure. Patency of the right IJ  vein was confirmed with ultrasound with image documentation. An appropriate skin site was determined. Skin site was marked. Region was prepped using maximum barrier technique including cap and mask, sterile gown, sterile gloves, large sterile sheet, and Chlorhexidine as cutaneous antisepsis. The region was infiltrated locally with 1% lidocaine. Under real-time ultrasound guidance, the right IJ vein was accessed with a 19 gauge needle; the needle tip within the vein was confirmed with ultrasound image documentation. The needle exchanged over a guidewire for vascular dilator which allowed advancement of a 20 cm Trialysis catheter. This was positioned with the tip at the cavoatrial junction. Spot chest radiograph shows good positioning and no pneumothorax. Catheter was flushed and sutured externally with 0-Prolene sutures. Patient tolerated the procedure well. FLUOROSCOPY TIME:  Less than 6 seconds, 1.9 uGym2 DAP COMPLICATIONS: COMPLICATIONS none IMPRESSION: 1. Technically successful right IJ Trialysis catheter placement. Electronically Signed   By: Lucrezia Europe M.D.   On: 11/05/2015 16:52   Ir US Guide Vasc Access Right  11/05/2015  CLINICAL DATA:  Acute renal insufficiency, needs venous access for hemodialysis EXAM: EXAM RIGHT IJ CATHETER PLACEMENT UNDER ULTRASOUND AND FLUOROSCOPIC GUIDANCE TECHNIQUE: The procedure, risks (including but not limited to bleeding, infection, organ damage, pneumothorax), benefits, and alternatives were explained to the patient. Questions regarding the procedure were encouraged and answered. The patient understands and consents to the procedure. Patency of the right IJ vein was confirmed with ultrasound with image documentation. An appropriate skin site was determined. Skin site was marked. Region was prepped using maximum barrier technique including cap and mask, sterile gown, sterile gloves, large sterile sheet, and Chlorhexidine as cutaneous antisepsis. The region was infiltrated  locally with 1% lidocaine. Under real-time ultrasound guidance, the right IJ vein was accessed with a 19 gauge needle; the needle tip within the vein was confirmed with ultrasound image documentation. The needle exchanged over a guidewire for vascular dilator which allowed advancement of a 20 cm Trialysis catheter. This was positioned with the tip at the cavoatrial junction. Spot chest radiograph shows good positioning and no pneumothorax. Catheter was flushed and sutured externally with 0-Prolene sutures. Patient tolerated the procedure well. FLUOROSCOPY TIME:  Less than 6 seconds, 1.9 uGym2 DAP COMPLICATIONS: COMPLICATIONS none IMPRESSION: 1. Technically successful right IJ Trialysis catheter placement. Electronically Signed   By: Lucrezia Europe M.D.   On: 11/05/2015 16:52    Scheduled Meds: . atorvastatin  40 mg Oral Daily  . benzonatate  100 mg Oral BID   Continuous Infusions:   Antibiotics Given (last 72 hours)    Date/Time Action Medication Dose   11/05/15 1028 Given   levofloxacin (LEVAQUIN) tablet 500 mg 500 mg      Principal Problem:   Acute kidney injury ([place]) Active Problems:   Hypertension   Cough   AKI (acute kidney injury) ([place])    Time spent: 77min    [facility]  [facility] Pager [phone] 7PM-7AM, please contact night-coverage  at [email or web address], password [facility] 11/06/2015, 6:12 PM  LOS: 8 days

## 2015-11-06 NOTE — Progress Notes (Addendum)
[facility] Rounding Note  Subjective: Had HD#1 yesterday 3.3 liters removed with weight 93.8-->90.5 (Admission weight 85 kg) Well tolerated For HD #2 today Trying to get renal biopsy scheduled for Friday  Swelling some better but still c/o nausea Objective:  Physical Examination BP 134/71 mmHg  Pulse 66  Temp(Src) 98.9 F (37.2 C) (Oral)  Resp 17  Ht 5\' 3"  (1.6 m)  Wt 89.7 kg (197 lb 12 oz)  BMI 35.04 kg/m2  SpO2 100%  LMP  Very pleasant Generalized anasarca No JVD Lungs clear to auscultation bilaterally 2-3+ bilateral pitting edema LE's - slightly softer Right trialysis catheter dressing place   Lab Results:  Recent Labs  11/04/15 0645 11/05/15 0503  WBC 9.5 10.2  HGB 11.0* 10.7*  HCT 32.5* 32.9*  PLT 351 348     Recent Labs  11/05/15 0503 11/06/15 0640  NA 137 137  K 3.9 3.8  CL 104 104  CO2 18* 21*  GLUCOSE 91 96  BUN 93* 68*  CREATININE 10.17* 8.92*  CALCIUM 7.7* 7.6*   UA large Hb, >300 protein, 6-30 RBC (repeated 12/4) UPC - 14.1 grams   Scheduled Medications: . atorvastatin  40 mg Oral Daily  . benzonatate  100 mg Oral BID   Assessment/Recommendations  1.Oligoanuric AKI  Baseline creatinine 0.78 10/16/15 - 4.92 on presentation - rose to >10 Working dx was hemodynamically mediated AKI due to GI illness, volume depletion, ACE on board, NSAID. She did not turn around with hydration and is now markedly volume overloaded/diuretic unresponsive  Kicker is presence of proteinuria and microhematuria raising question of acute GN.  ANA and complements normal. ANCA neg.  UPC suggests 14 grams of proteinuria No response to high dose lasix 1. HD started yesterday and will get TMT #2 today for volume removal and azotemia 2. Renal biopsy Friday  2.CAP  Completed rocephin/azithro and now on po levaquin  3.HTN Good right now without meds   Jamal Maes, MD [place] Pager 11/06/2015, 12:04 PM

## 2015-11-06 NOTE — Care Management Important Message (Signed)
Important Message  Patient Details  Name: Lydia Edwards MRN: [id number] Date of Birth: [birth year]   Medicare Important Message Given:  Yes    [name] P [place] 11/06/2015, 11:42 AM

## 2015-11-07 ENCOUNTER — Inpatient Hospital Stay (HOSPITAL_COMMUNITY): Payer: Commercial Managed Care - HMO

## 2015-11-07 LAB — RENAL FUNCTION PANEL
ANION GAP: 5 (ref 5–15)
Albumin: 1.5 g/dL — ABNORMAL LOW (ref 3.5–5.0)
BUN: 36 mg/dL — AB (ref 6–20)
CHLORIDE: 104 mmol/L (ref 101–111)
CO2: 28 mmol/L (ref 22–32)
Calcium: 7.4 mg/dL — ABNORMAL LOW (ref 8.9–10.3)
Creatinine, Ser: 7.28 mg/dL — ABNORMAL HIGH (ref 0.44–1.00)
GFR calc Af Amer: 6 mL/min — ABNORMAL LOW (ref >60)
GFR, EST NON AFRICAN AMERICAN: 5 mL/min — AB (ref >60)
Glucose, Bld: 100 mg/dL — ABNORMAL HIGH (ref 65–99)
POTASSIUM: 3.8 mmol/L (ref 3.5–5.1)
Phosphorus: 6.3 mg/dL — ABNORMAL HIGH (ref 2.5–4.6)
Sodium: 137 mmol/L (ref 135–145)

## 2015-11-07 LAB — PROTIME-INR
INR: 1.16 (ref 0.00–1.49)
Prothrombin Time: 15 seconds (ref 11.6–15.2)

## 2015-11-07 LAB — CBC
HEMATOCRIT: 34.1 % — AB (ref 36.0–46.0)
HEMOGLOBIN: 11.5 g/dL — AB (ref 12.0–15.0)
MCH: 27.4 pg (ref 26.0–34.0)
MCHC: 33.7 g/dL (ref 30.0–36.0)
MCV: 81.4 fL (ref 78.0–100.0)
Platelets: 266 10*3/uL (ref 150–400)
RBC: 4.19 MIL/uL (ref 3.87–5.11)
RDW: 13.8 % (ref 11.5–15.5)
WBC: 12.1 10*3/uL — AB (ref 4.0–10.5)

## 2015-11-07 LAB — APTT: APTT: 35 s (ref 24–37)

## 2015-11-07 MED ORDER — LIDOCAINE HCL (PF) 1 % IJ SOLN
INTRAMUSCULAR | Status: AC
  Filled 2015-11-07: qty 10

## 2015-11-07 MED ORDER — FENTANYL CITRATE (PF) 100 MCG/2ML IJ SOLN
INTRAMUSCULAR | Status: AC
  Filled 2015-11-07: qty 4

## 2015-11-07 MED ORDER — GELATIN ABSORBABLE 12-7 MM EX MISC
CUTANEOUS | Status: AC
  Filled 2015-11-07: qty 1

## 2015-11-07 MED ORDER — MIDAZOLAM HCL 2 MG/2ML IJ SOLN
INTRAMUSCULAR | Status: AC
  Filled 2015-11-07: qty 4

## 2015-11-07 MED ORDER — FENTANYL CITRATE (PF) 100 MCG/2ML IJ SOLN
INTRAMUSCULAR | Status: AC | PRN
  Administered 2015-11-07: 50 ug via INTRAVENOUS

## 2015-11-07 MED ORDER — MIDAZOLAM HCL 2 MG/2ML IJ SOLN
INTRAMUSCULAR | Status: AC | PRN
  Administered 2015-11-07: 1 mg via INTRAVENOUS

## 2015-11-07 NOTE — Progress Notes (Signed)
Patient returned renal biopsy in prone position. VSS. Denied pain. bandaid to L flank CDI. Per report patient remained prone until after 1551. Educated patient about bedrest x 3 hours. Bed alarm on. Educated patient to call for assistance prior to getting out of bed after BR restrictions end 1900. SCDs in place. Diet resumed per MD. Will continue to monitor. Bartholomew Crews, RN

## 2015-11-07 NOTE — Procedures (Signed)
Successful LT RENAL core BX No comp Stable Path pending Full report in PACS

## 2015-11-07 NOTE — Progress Notes (Signed)
[facility] Rounding Note  Subjective: Had HD#1 2 yesterday well tolerated Weight down another 2 kg Nausea maybe a little better Kept down dinner Waiting for renal bx this AM  Objective:  Physical Examination BP 143/67 mmHg  Pulse 61  Temp(Src) 98.6 F (37 C) (Oral)  Resp 19  Ht 5\' 3"  (1.6 m)  Wt 84.3 kg (185 lb 13.6 oz)  BMI 32.93 kg/m2  SpO2 98%  LMP  Very pleasant NAD No JVD Lungs clear to auscultation bilaterally 1-2+ edema bilat LE's improved Right IJ trialysis catheter w/dressing place  Weight trending 11/29  85.2 kg (admission) 12/7    93.8  ->90.5 (HD #1) 12/8 86.7->84.2 (HD#2)   Lab Results:  Recent Labs  11/06/15 1415 11/07/15 0257  WBC 12.8* 12.1*  HGB 11.6* 11.5*  HCT 34.8* 34.1*  PLT 343 266     Recent Labs  11/06/15 1758 11/07/15 0257  NA 139 137  K 3.8 3.8  CL 104 104  CO2 23 28  GLUCOSE 96 100*  BUN 69* 36*  CREATININE 9.27* 7.28*  CALCIUM 8.0* 7.4*   UA large Hb, >300 protein, 6-30 RBC (repeated 12/4) UPC - 14.1 grams   Scheduled Medications: . atorvastatin  40 mg Oral Daily  . benzonatate  100 mg Oral BID    Background 64 y.o. female who sent by PCP to ED for acute renal failure. Patient saw PCP for nausea, vomiting, diarrhea, chills and cough since Thanksgiving. Had been taking some Advil and daily Lotensin and Hydrodiuril for BP. On 10/06/15 creat was 0.78mg /dl. On 10/27/15 creat was 4.92 rising to 6.44 and we were consulted. UA > 300 mg pro and 6-30 RBCs and 0-5 WBCs. Renal ultrasound 12.8cm R and 14.5 cm L echogenic kidneys. CXR  LLL PNA.  Despite IVF/discontinuation of offending medicines/treatment of PNA  no improvement in renal fx, creatinine up to 10. Dialysis initiated 11/05/15 and renal biopsy done 11/07/15. ANA, ANCA, complements, Hep serologies all neg.   Assessment/Recommendations  1. Oligoanuric AKI  Baseline creatinine 0.78 10/16/15 - 4.92 on presentation - rose to >10. Working dx was  hemodynamically mediated AKI due to GI illness, volume depletion, ACE on board, NSAID.  She did not turn around with hydration and developed marked volume overload as a result of fluid administration. No response to high dose lasix. Repeat UA confirmed presence of proteinuria and microhematuria raising question of acute GN.  ANA and complements normal. ANCA neg. UPC suggests 14 grams of proteinuria. HD initiated 12/7 and has had 2 treatments with improvement in volume and some improvement in nausea. For renal biopsy by IR today, HD again tomorrow (no heparin) 2. CAP - Completed rocephin/azithro and now on po levaquin 3. HTN - Good right now without meds.     Jamal Maes, MD [facility] [phone] Pager 11/07/2015, 8:53 AM

## 2015-11-07 NOTE — Sedation Documentation (Signed)
Bandaid L lower back intact

## 2015-11-07 NOTE — Sedation Documentation (Signed)
MD wants pt prone for first hour- bedrest total 3 hours

## 2015-11-07 NOTE — Progress Notes (Signed)
[facility] PROGRESS NOTE  Lydia Edwards [id number] DOB: 1951/08/17 DOA: 10/28/2015 PCP: Alesia Richards, MD   64/F with PMH of preDM, HTN presented to ER with abnormal labs/AKI and pneumonia. -She was in Oliguric ATN for 4days, Renal following -Hydrated for 3days initially , since she was 8-10L positive and hence IVF stopped -Started making some urine in last 2days albeit 300-400cc now -Creatinine still very high but rate of rise improved -Stable from pneumonia standpoint and completed antibiotics.  - no vomiting last night.   Assessment/Plan: 1. AKI/ ATN, Oliguric -due to dehydration with ACE/Diuretic and NSAID use, unable to rule out immune mediated GN too -Renal US without hydronephrosis -creatinine worsening, was oliguric for 4days, started having urine output.  -Stopped HCTZ, ACE on admission, hydrated for almost 3d, 8-10L positive, stopped IVF-3days ago - currently fluid overloaded and started on lasix.  - not responding much, but at the same time hasn't worsened.  - plan for HD as per renal on 12/7 and 12/8 and  Underwent renal biopsy on Friday.   2. CAP -completed the treatment for pneumonia.  -FU CXR in 4-6weeks  3. PRe DM -stable -CBG (last 3)  No results for input(s): GLUCAP in the last 72 hours.    4. HTN -stable, ACE/HCTZ on hold  DVT proph: lovenox  Code Status: Full Code Family Communication: none at bedside Disposition Plan: home when creatinine improves   Consultants: Renal  HPI/Subjective: No vomiting last night, slightly nauseated when she coughs.  Objective: Filed Vitals:   11/07/15 1605 11/07/15 1645  BP: 147/58 137/65  Pulse: 59 62  Temp: 98.3 F (36.8 C)   Resp: 20 20    Intake/Output Summary (Last 24 hours) at 11/07/15 1913 Last data filed at 11/07/15 1909  Gross per 24 hour  Intake     30 ml  Output    450 ml  Net   -420 ml   Filed Weights   11/06/15 1416 11/06/15 1745 11/06/15 2038  Weight: 86.7 kg (191  lb 2.2 oz) 84.2 kg (185 lb 10 oz) 84.3 kg (185 lb 13.6 oz)    Exam:   General:  AAOx3, no distress  Cardiovascular: S1S2/RRR  Respiratory: Clear today  Abdomen: soft, NT, BS present  Musculoskeletal: trace edema c/c   Data Reviewed: Basic Metabolic Panel:  Recent Labs Lab 11/03/15 0609 11/04/15 0645 11/05/15 0503 11/06/15 0640 11/06/15 1758 11/07/15 0257  NA 140 137 137 137 139 137  K 3.9 3.8 3.9 3.8 3.8 3.8  CL 106 105 104 104 104 104  CO2 21* 20* 18* 21* 23 28  GLUCOSE 90 97 91 96 96 100*  BUN 89* 92* 93* 68* 69* 36*  CREATININE 9.83* 10.11* 10.17* 8.92* 9.27* 7.28*  CALCIUM 7.8* 7.6* 7.7* 7.6* 8.0* 7.4*  PHOS 10.2* 10.2*  --  9.1* 9.1* 6.3*   Liver Function Tests:  Recent Labs Lab 11/03/15 0609 11/04/15 0645 11/06/15 0640 11/06/15 1758 11/07/15 0257  ALBUMIN 1.6* 1.6* 1.6* 1.7* 1.5*   No results for input(s): LIPASE, AMYLASE in the last 168 hours. No results for input(s): AMMONIA in the last 168 hours. CBC:  Recent Labs Lab 11/03/15 0609 11/04/15 0645 11/05/15 0503 11/06/15 1415 11/07/15 0257  WBC 9.7 9.5 10.2 12.8* 12.1*  HGB 10.7* 11.0* 10.7* 11.6* 11.5*  HCT 32.1* 32.5* 32.9* 34.8* 34.1*  MCV 81.3 81.0 81.4 81.5 81.4  PLT 350 351 348 343 266   Cardiac Enzymes: No results for input(s): CKTOTAL, CKMB, CKMBINDEX, TROPONINI in the  last 168 hours. BNP (last 3 results) No results for input(s): BNP in the last 8760 hours.  ProBNP (last 3 results) No results for input(s): PROBNP in the last 8760 hours.  CBG: No results for input(s): GLUCAP in the last 168 hours.  No results found for this or any previous visit (from the past 240 hour(s)).   Studies: US Biopsy  11/07/2015  CLINICAL DATA:  Proteinuria, acute renal failure EXAM: ULTRASOUND GUIDED CORE BIOPSY OF LEFT KIDNEY MEDICATIONS: 1.0 mg IV Versed; 50 mcg IV Fentanyl Total Moderate Sedation Time: 10 MINUTES PROCEDURE: The procedure, risks, benefits, and alternatives were explained to the  patient. Questions regarding the procedure were encouraged and answered. The patient understands and consents to the procedure. The LEFT POSTERIOR FLANK was prepped with CHLORAPREP in a sterile fashion, and a sterile drape was applied covering the operative field. A sterile gown and sterile gloves were used for the procedure. Local anesthesia was provided with 1% Lidocaine. Patient positioned prone. Preliminary ultrasound performed. Left kidney lower pole was localized. Under sterile conditions and local anesthesia, a guide needle was advanced to the left kidney lower pole cortex. Needle position confirmed with ultrasound. Through the access, 2 16 gauge core biopsies obtained of the lower pole. Samples placed in saline. Needles removed. No immediate complication. Patient tolerated the biopsy well. COMPLICATIONS: None. FINDINGS: Imaging confirms needle placement to the left kidney lower pole for renal biopsy IMPRESSION: Successful ultrasound left kidney core biopsy Electronically Signed   By: Jerilynn Mages.  Shick M.D.   On: 11/07/2015 17:13    Scheduled Meds: . atorvastatin  40 mg Oral Daily  . benzonatate  100 mg Oral BID  . fentaNYL      . lidocaine (PF)      . midazolam       Continuous Infusions:   Antibiotics Given (last 72 hours)    Date/Time Action Medication Dose   11/05/15 1028 Given   levofloxacin (LEVAQUIN) tablet 500 mg 500 mg      Principal Problem:   Acute kidney injury ([place]) Active Problems:   Hypertension   Cough   AKI (acute kidney injury) ([place])    Time spent: 49min    Harshith Pursell  [facility] Pager [phone] 7PM-7AM, please contact night-coverage at [email or web address], password [facility] 11/07/2015, 7:13 PM  LOS: 9 days

## 2015-11-08 LAB — CBC
HEMATOCRIT: 34.1 % — AB (ref 36.0–46.0)
Hemoglobin: 11.4 g/dL — ABNORMAL LOW (ref 12.0–15.0)
MCH: 27.5 pg (ref 26.0–34.0)
MCHC: 33.4 g/dL (ref 30.0–36.0)
MCV: 82.2 fL (ref 78.0–100.0)
PLATELETS: 245 10*3/uL (ref 150–400)
RBC: 4.15 MIL/uL (ref 3.87–5.11)
RDW: 13.7 % (ref 11.5–15.5)
WBC: 11.6 10*3/uL — AB (ref 4.0–10.5)

## 2015-11-08 LAB — RENAL FUNCTION PANEL
ALBUMIN: 1.5 g/dL — AB (ref 3.5–5.0)
Anion gap: 10 (ref 5–15)
BUN: 47 mg/dL — AB (ref 6–20)
CO2: 25 mmol/L (ref 22–32)
CREATININE: 8.94 mg/dL — AB (ref 0.44–1.00)
Calcium: 7.7 mg/dL — ABNORMAL LOW (ref 8.9–10.3)
Chloride: 104 mmol/L (ref 101–111)
GFR calc Af Amer: 5 mL/min — ABNORMAL LOW (ref >60)
GFR, EST NON AFRICAN AMERICAN: 4 mL/min — AB (ref >60)
Glucose, Bld: 94 mg/dL (ref 65–99)
PHOSPHORUS: 7.9 mg/dL — AB (ref 2.5–4.6)
Potassium: 4.1 mmol/L (ref 3.5–5.1)
Sodium: 139 mmol/L (ref 135–145)

## 2015-11-08 MED ORDER — ONDANSETRON HCL 4 MG/2ML IJ SOLN
INTRAMUSCULAR | Status: AC
  Filled 2015-11-08: qty 2

## 2015-11-08 NOTE — Progress Notes (Signed)
[facility] Rounding Note  Subjective: S/p percutaneous left renal bx yesterday No pain but says had some gross hematuria last evening For HD #3 today Denies pain, SOB and nausea is some better  Objective:  Physical Examination BP 141/66 mmHg  Pulse 58  Temp(Src) 98.1 F (36.7 C) (Oral)  Resp 17  Ht 5\' 3"  (1.6 m)  Wt 84.6 kg (186 lb 8.2 oz)  BMI 33.05 kg/m2  SpO2 98%  LMP  Very pleasant NAD R IJ temp trialysis HD cath with some oozing at exit site No JVD Lungs clear to auscultation bilaterally 1+ edema dema bilat LE's improved  Weight trending 11/29  85.2 kg (admission) 12/07  93.8  ->90.5 (HD #1) 12/08 86.7->84.2 (HD#2) 12/09 84.6 kg 12/10  Lab Results:  Recent Labs  11/07/15 0257 11/08/15 0540  WBC 12.1* 11.6*  HGB 11.5* 11.4*  HCT 34.1* 34.1*  PLT 266 245     Recent Labs  11/07/15 0257 11/08/15 0540  NA 137 139  K 3.8 4.1  CL 104 104  CO2 28 25  GLUCOSE 100* 94  BUN 36* 47*  CREATININE 7.28* 8.94*  CALCIUM 7.4* 7.7*   UA large Hb, >300 protein, 6-30 RBC (repeated 12/4) UPC - 14.1 grams Hep B neg   Scheduled Medications: . atorvastatin  40 mg Oral Daily  . benzonatate  100 mg Oral BID    Background 64 y.o. female who sent by PCP to ED for acute renal failure. Patient saw PCP for nausea, vomiting, diarrhea, chills and cough since Thanksgiving. Had been taking some Advil and daily Lotensin and Hydrodiuril for BP. On 10/06/15 creat was 0.78mg /dl. On 10/27/15 creat was 4.92 rising to a high of 10. . UA > 300 mg pro and 6-30 RBCs and 0-5 WBCs. Renal ultrasound 12.8cm R and 14.5 cm L echogenic kidneys. CXR  LLL PNA.  Despite IVF/discontinuation of offending medicines/treatment of PNA  no improvement in renal fx.. Dialysis initiated 11/05/15 and renal biopsy done 11/07/15. ANA, ANCA, complements, Hep serologies all neg.   Assessment/Recommendations  1. Oligoanuric AKI  (Baseline creatinine 0.78 10/16/15) - 4.92 on presentation - rose  to >10. Initial working dx was hemodynamically mediated AKI due to GI illness, volume depletion, ACE on board, NSAID.  Did not turn around with hydration and developed marked volume overload as a result of fluid administration. No response to high dose lasix. Repeat UA confirmed presence of proteinuria and microhematuria raising question of acute GN.  ANA and complements normal. ANCA neg. UPC suggests 14 grams of proteinuria. HD initiated 12/7 and has had 2 treatments, for 3rd today. Needs additional volume removal.  S/p percutaneous left renal biopsy yesterday - hope for prelim results at least on the light microscopy on Monday afternoon.     2. S/p renal bx 12/9 - reported some gross hematuria last PM post bx. No pain. Hb stable. Avoid all anticoagulants for minimum of 1 month post bx, including aspirin. 3. CAP - Completed rocephin/azithro and now on po levaquin 4. HTN - Good right now without meds.     Jamal Maes, MD [facility] [phone] Pager 11/08/2015, 7:22 AM

## 2015-11-08 NOTE — Procedures (Signed)
I have personally attended this patient's dialysis session.   R temp IJ catheter (12/8) No heparin UF goal 2-3 liters Beginning BP 141/66 2K bath  Jamal Maes, MD [facility] [phone] Pager 11/08/2015, 7:32 AM

## 2015-11-08 NOTE — Progress Notes (Addendum)
[facility] PROGRESS NOTE  HONESTY SCHWALLIE [id number] DOB: Jun 23, 1951 DOA: 10/28/2015 PCP: Alesia Richards, MD   64/F with PMH of preDM, HTN presented to ER with abnormal labs/AKI and pneumonia. -She was in Oliguric ATN for 4days, Renal following -Hydrated for 3days initially , since she was 8-10L positive and hence IVF stopped -Started making some urine in last 2days albeit 300-400cc now -Creatinine still very high but rate of rise improved -Stable from pneumonia standpoint and completed antibiotics.  - no vomiting last night.   Assessment/Plan: 1. AKI/ ATN, Oliguric -due to dehydration with ACE/Diuretic and NSAID use, unable to rule out immune mediated GN too -Renal US without hydronephrosis -creatinine worsening, was oliguric for 4days, started having urine output.  -Stopped HCTZ, ACE on admission, hydrated for almost 3d, 8-10L positive, stopped IVF-3days ago - currently fluid overloaded and started on lasix.  - not responding much, but at the same time hasn't worsened.  - underwent HD as per renal on 12/7 and 12/8 and  Underwent renal biopsy on Friday.   2. CAP -completed the treatment for pneumonia.  -FU CXR in 4-6weeks  3. PRe DM -stable -CBG (last 3)  No results for input(s): GLUCAP in the last 72 hours.    4. HTN -stable, ACE/HCTZ on hold  DVT proph: lovenox  Code Status: Full Code Family Communication: none at bedside Disposition Plan: home when creatinine improves   Consultants: Renal  HPI/Subjective: No vomiting last night, slightly nauseated when she coughs.  Objective: Filed Vitals:   11/08/15 1045 11/08/15 1731  BP: 106/78 146/68  Pulse: 84 64  Temp: 97.6 F (36.4 C) 99.3 F (37.4 C)  Resp: 18 18    Intake/Output Summary (Last 24 hours) at 11/08/15 1737 Last data filed at 11/08/15 1732  Gross per 24 hour  Intake    240 ml  Output   2305 ml  Net  -2065 ml   Filed Weights   11/07/15 2000 11/08/15 0730 11/08/15 1045   Weight: 84.6 kg (186 lb 8.2 oz) 82.8 kg (182 lb 8.7 oz) 80.8 kg (178 lb 2.1 oz)    Exam:   General:  AAOx3, no distress  Cardiovascular: S1S2/RRR  Respiratory: Clear today  Abdomen: soft, NT, BS present  Musculoskeletal: trace edema c/c   Data Reviewed: Basic Metabolic Panel:  Recent Labs Lab 11/04/15 0645 11/05/15 0503 11/06/15 0640 11/06/15 1758 11/07/15 0257 11/08/15 0540  NA 137 137 137 139 137 139  K 3.8 3.9 3.8 3.8 3.8 4.1  CL 105 104 104 104 104 104  CO2 20* 18* 21* 23 28 25   GLUCOSE 97 91 96 96 100* 94  BUN 92* 93* 68* 69* 36* 47*  CREATININE 10.11* 10.17* 8.92* 9.27* 7.28* 8.94*  CALCIUM 7.6* 7.7* 7.6* 8.0* 7.4* 7.7*  PHOS 10.2*  --  9.1* 9.1* 6.3* 7.9*   Liver Function Tests:  Recent Labs Lab 11/04/15 0645 11/06/15 0640 11/06/15 1758 11/07/15 0257 11/08/15 0540  ALBUMIN 1.6* 1.6* 1.7* 1.5* 1.5*   No results for input(s): LIPASE, AMYLASE in the last 168 hours. No results for input(s): AMMONIA in the last 168 hours. CBC:  Recent Labs Lab 11/04/15 0645 11/05/15 0503 11/06/15 1415 11/07/15 0257 11/08/15 0540  WBC 9.5 10.2 12.8* 12.1* 11.6*  HGB 11.0* 10.7* 11.6* 11.5* 11.4*  HCT 32.5* 32.9* 34.8* 34.1* 34.1*  MCV 81.0 81.4 81.5 81.4 82.2  PLT 351 348 343 266 245   Cardiac Enzymes: No results for input(s): CKTOTAL, CKMB, CKMBINDEX, TROPONINI in the last  168 hours. BNP (last 3 results) No results for input(s): BNP in the last 8760 hours.  ProBNP (last 3 results) No results for input(s): PROBNP in the last 8760 hours.  CBG: No results for input(s): GLUCAP in the last 168 hours.  No results found for this or any previous visit (from the past 240 hour(s)).   Studies: US Biopsy  11/07/2015  CLINICAL DATA:  Proteinuria, acute renal failure EXAM: ULTRASOUND GUIDED CORE BIOPSY OF LEFT KIDNEY MEDICATIONS: 1.0 mg IV Versed; 50 mcg IV Fentanyl Total Moderate Sedation Time: 10 MINUTES PROCEDURE: The procedure, risks, benefits, and  alternatives were explained to the patient. Questions regarding the procedure were encouraged and answered. The patient understands and consents to the procedure. The LEFT POSTERIOR FLANK was prepped with CHLORAPREP in a sterile fashion, and a sterile drape was applied covering the operative field. A sterile gown and sterile gloves were used for the procedure. Local anesthesia was provided with 1% Lidocaine. Patient positioned prone. Preliminary ultrasound performed. Left kidney lower pole was localized. Under sterile conditions and local anesthesia, a guide needle was advanced to the left kidney lower pole cortex. Needle position confirmed with ultrasound. Through the access, 2 16 gauge core biopsies obtained of the lower pole. Samples placed in saline. Needles removed. No immediate complication. Patient tolerated the biopsy well. COMPLICATIONS: None. FINDINGS: Imaging confirms needle placement to the left kidney lower pole for renal biopsy IMPRESSION: Successful ultrasound left kidney core biopsy Electronically Signed   By: Jerilynn Mages.  Shick M.D.   On: 11/07/2015 17:13    Scheduled Meds: . atorvastatin  40 mg Oral Daily  . benzonatate  100 mg Oral BID   Continuous Infusions:   Antibiotics Given (last 72 hours)    None      Principal Problem:   Acute kidney injury ([place]) Active Problems:   Hypertension   Cough   AKI (acute kidney injury) ([place])    Time spent: 34min    Amarrion Pastorino  [facility] Pager [phone] 7PM-7AM, please contact night-coverage at [email or web address], password [facility] 11/08/2015, 5:37 PM  LOS: 10 days

## 2015-11-09 LAB — CBC
HCT: 34.9 % — ABNORMAL LOW (ref 36.0–46.0)
Hemoglobin: 11.5 g/dL — ABNORMAL LOW (ref 12.0–15.0)
MCH: 27.3 pg (ref 26.0–34.0)
MCHC: 33 g/dL (ref 30.0–36.0)
MCV: 82.9 fL (ref 78.0–100.0)
PLATELETS: 209 10*3/uL (ref 150–400)
RBC: 4.21 MIL/uL (ref 3.87–5.11)
RDW: 13.7 % (ref 11.5–15.5)
WBC: 12 10*3/uL — ABNORMAL HIGH (ref 4.0–10.5)

## 2015-11-09 LAB — RENAL FUNCTION PANEL
ANION GAP: 7 (ref 5–15)
Albumin: 1.5 g/dL — ABNORMAL LOW (ref 3.5–5.0)
BUN: 30 mg/dL — ABNORMAL HIGH (ref 6–20)
CO2: 28 mmol/L (ref 22–32)
Calcium: 7.5 mg/dL — ABNORMAL LOW (ref 8.9–10.3)
Chloride: 101 mmol/L (ref 101–111)
Creatinine, Ser: 7.38 mg/dL — ABNORMAL HIGH (ref 0.44–1.00)
GFR calc Af Amer: 6 mL/min — ABNORMAL LOW (ref >60)
GFR calc non Af Amer: 5 mL/min — ABNORMAL LOW (ref >60)
GLUCOSE: 103 mg/dL — AB (ref 65–99)
POTASSIUM: 3.6 mmol/L (ref 3.5–5.1)
Phosphorus: 6.7 mg/dL — ABNORMAL HIGH (ref 2.5–4.6)
Sodium: 136 mmol/L (ref 135–145)

## 2015-11-09 NOTE — Progress Notes (Signed)
[facility] PROGRESS NOTE  Lydia Edwards [id number] DOB: 10-12-1951 DOA: 10/28/2015 PCP: Alesia Richards, MD   64/F with PMH of preDM, HTN presented to ER with abnormal labs/AKI and pneumonia. -She was in Oliguric ATN for 4days, Renal following -Hydrated for 3days initially , since she was 8-10L positive and hence IVF stopped -Started making some urine in last 2days albeit 300-400cc now -Creatinine still very high but rate of rise improved -Stable from pneumonia standpoint and completed antibiotics.  - she reports having some nausea last night.   Assessment/Plan: 1. AKI/ ATN, Oliguric -due to dehydration with ACE/Diuretic and NSAID use, unable to rule out immune mediated GN too -Renal US without hydronephrosis -creatinine worsening, was oliguric for 4days, started having urine output.   -Stopped HCTZ, ACE on admission, hydrated for almost 3d, 8-10L positive, stopped IVF-on 12/3 - currently fluid overloaded and started on lasix.  - not responding much, but at the same time hasn't worsened.  - underwent HD as per renal on 12/7 and 12/8,  and  Underwent renal biopsy on Friday.   2. CAP -completed the treatment for pneumonia.  -FU CXR in 4-6weeks  3. PRe DM -stable   4. HTN -stable, ACE/HCTZ on hold  DVT proph: lovenox  Code Status: Full Code Family Communication: none at bedside Disposition Plan: home when creatinine improves   Consultants: Renal  HPI/Subjective: Nauseated last night, feeling better.   Objective: Filed Vitals:   11/08/15 1731 11/08/15 2159  BP: 146/68 138/65  Pulse: 64 61  Temp: 99.3 F (37.4 C) 99 F (37.2 C)  Resp: 18 17    Intake/Output Summary (Last 24 hours) at 11/09/15 1123 Last data filed at 11/09/15 1046  Gross per 24 hour  Intake    960 ml  Output    200 ml  Net    760 ml   Filed Weights   11/07/15 2000 11/08/15 0730 11/08/15 1045  Weight: 84.6 kg (186 lb 8.2 oz) 82.8 kg (182 lb 8.7 oz) 80.8 kg (178 lb 2.1 oz)     Exam:   General:  AAOx3, no distress  Cardiovascular: S1S2/RRR  Respiratory: Clear today  Abdomen: soft, NT, BS present  Musculoskeletal: trace edema c/c   Data Reviewed: Basic Metabolic Panel:  Recent Labs Lab 11/06/15 0640 11/06/15 1758 11/07/15 0257 11/08/15 0540 11/09/15 0435  NA 137 139 137 139 136  K 3.8 3.8 3.8 4.1 3.6  CL 104 104 104 104 101  CO2 21* 23 28 25 28   GLUCOSE 96 96 100* 94 103*  BUN 68* 69* 36* 47* 30*  CREATININE 8.92* 9.27* 7.28* 8.94* 7.38*  CALCIUM 7.6* 8.0* 7.4* 7.7* 7.5*  PHOS 9.1* 9.1* 6.3* 7.9* 6.7*   Liver Function Tests:  Recent Labs Lab 11/06/15 0640 11/06/15 1758 11/07/15 0257 11/08/15 0540 11/09/15 0435  ALBUMIN 1.6* 1.7* 1.5* 1.5* 1.5*   No results for input(s): LIPASE, AMYLASE in the last 168 hours. No results for input(s): AMMONIA in the last 168 hours. CBC:  Recent Labs Lab 11/05/15 0503 11/06/15 1415 11/07/15 0257 11/08/15 0540 11/09/15 0435  WBC 10.2 12.8* 12.1* 11.6* 12.0*  HGB 10.7* 11.6* 11.5* 11.4* 11.5*  HCT 32.9* 34.8* 34.1* 34.1* 34.9*  MCV 81.4 81.5 81.4 82.2 82.9  PLT 348 343 266 245 209   Cardiac Enzymes: No results for input(s): CKTOTAL, CKMB, CKMBINDEX, TROPONINI in the last 168 hours. BNP (last 3 results) No results for input(s): BNP in the last 8760 hours.  ProBNP (last 3 results) No results  for input(s): PROBNP in the last 8760 hours.  CBG: No results for input(s): GLUCAP in the last 168 hours.  No results found for this or any previous visit (from the past 240 hour(s)).   Studies: US Biopsy  11/07/2015  CLINICAL DATA:  Proteinuria, acute renal failure EXAM: ULTRASOUND GUIDED CORE BIOPSY OF LEFT KIDNEY MEDICATIONS: 1.0 mg IV Versed; 50 mcg IV Fentanyl Total Moderate Sedation Time: 10 MINUTES PROCEDURE: The procedure, risks, benefits, and alternatives were explained to the patient. Questions regarding the procedure were encouraged and answered. The patient understands and consents to  the procedure. The LEFT POSTERIOR FLANK was prepped with CHLORAPREP in a sterile fashion, and a sterile drape was applied covering the operative field. A sterile gown and sterile gloves were used for the procedure. Local anesthesia was provided with 1% Lidocaine. Patient positioned prone. Preliminary ultrasound performed. Left kidney lower pole was localized. Under sterile conditions and local anesthesia, a guide needle was advanced to the left kidney lower pole cortex. Needle position confirmed with ultrasound. Through the access, 2 16 gauge core biopsies obtained of the lower pole. Samples placed in saline. Needles removed. No immediate complication. Patient tolerated the biopsy well. COMPLICATIONS: None. FINDINGS: Imaging confirms needle placement to the left kidney lower pole for renal biopsy IMPRESSION: Successful ultrasound left kidney core biopsy Electronically Signed   By: Jerilynn Mages.  Shick M.D.   On: 11/07/2015 17:13    Scheduled Meds: . atorvastatin  40 mg Oral Daily  . benzonatate  100 mg Oral BID   Continuous Infusions:   Antibiotics Given (last 72 hours)    None      Principal Problem:   Acute kidney injury ([place]) Active Problems:   Hypertension   Cough   AKI (acute kidney injury) ([place])    Time spent: 17min    Danelly Hassinger  [facility] Pager [phone] 7PM-7AM, please contact night-coverage at [email or web address], password [facility] 11/09/2015, 11:23 AM  LOS: 11 days

## 2015-11-09 NOTE — Progress Notes (Signed)
[facility] Rounding Note  Subjective: S/p percutaneous left renal bx 12/9 No pain but says urine still blood tinged Had HD #3 yesterday Denies pain, SOB and nausea is some better - thinks will be able to eat her lunch today Anxious to hear something from her bx   Objective:  Physical Examination BP 138/65 mmHg  Pulse 61  Temp(Src) 99 F (37.2 C) (Oral)  Resp 17  Ht 5\' 3"  (1.6 m)  Wt 80.8 kg (178 lb 2.1 oz)  BMI 31.56 kg/m2  SpO2 99%  LMP  Very pleasant NAD  Eating lunch R IJ temp trialysis HD cath (12/7) No JVD Lungs clear to auscultation bilaterally Chubby extremities with only trace edema now   Weight trending 11/29  85.2 kg (admission) 12/07  93.8  ->90.5 (HD #1) 12/08 86.7->84.2 (HD#2) 12/09 84.6 kg 12/10 82.8->80.8 (HD#3)  Lab Results:  Recent Labs  11/08/15 0540 11/09/15 0435  WBC 11.6* 12.0*  HGB 11.4* 11.5*  HCT 34.1* 34.9*  PLT 245 209     Recent Labs  11/08/15 0540 11/09/15 0435  NA 139 136  K 4.1 3.6  CL 104 101  CO2 25 28  GLUCOSE 94 103*  BUN 47* 30*  CREATININE 8.94* 7.38*  CALCIUM 7.7* 7.5*   UA large Hb, >300 protein, 6-30 RBC (repeated 12/4) UPC - 14.1 grams Hep B neg   Scheduled Medications: . atorvastatin  40 mg Oral Daily  . benzonatate  100 mg Oral BID    Background 64 y.o. female sent by PCP to ED for acute renal failure. Had nausea, vomiting, diarrhea, chills and cough since Thanksgiving. Had been taking some Advil and daily Lotensin and Hydrodiuril for BP. On 10/06/15 creat was 0.78 mg/dl. On 10/27/15 creat was 4.92 rising to a high of 10. UA > 300 mg pro and 6-30 RBCs and 0-5 WBCs. Renal ultrasound 12.8cm R and 14.5 cm L echogenic kidneys. CXR  LLL PNA.  Despite IVF/discontinuation of offending medicines/treatment of PNA  no improvement in renal fx.. Dialysis initiated 11/05/15 and renal biopsy done 11/07/15. ANA, ANCA, complements, Hep serologies all neg.    Assessment/Recommendations  1. Oligoanuric AKI  (Baseline creatinine 0.78 10/16/15) - 4.92 on presentation - rose to >10. Initial working dx was hemodynamically mediated AKI due to GI illness, volume depletion, ACE on board, NSAID.  Did not turn around with hydration and developed marked volume overload as a result of fluid administration. No response to high dose lasix. Repeat UA confirmed presence of proteinuria and microhematuria raising question of acute GN.  ANA and complements normal. ANCA neg. UPC suggests 14 grams of proteinuria. HD initiated 12/7 and has had 3 treatments. Volume status much improved and uremic nausea better as well.  S/p percutaneous left renal biopsy 12/9 - hope for prelim results at least on the light microscopy on Monday afternoon. Anticipate next HD on Tuesday.  2. S/p renal bx 12/9 - reported some persistent gross hematuria. No pain. Hb stable. Avoid all anticoagulants for minimum of 1 month post bx, including aspirin. 3. CAP - Completed rocephin/azithro and po levaquin. Off ATB's now. No pulm sx. 4. HTN - Good right now without meds.     Jamal Maes, MD [facility] [phone] Pager 11/09/2015, 12:59 PM

## 2015-11-10 ENCOUNTER — Inpatient Hospital Stay (HOSPITAL_COMMUNITY): Payer: Commercial Managed Care - HMO

## 2015-11-10 NOTE — Care Management Important Message (Signed)
Important Message  Patient Details  Name: Lydia Edwards MRN: [id number] Date of Birth: 1951/08/24   Medicare Important Message Given:  Yes    Chuckie Mccathern P [place]t [place] 11/10/2015, 3:27 PM

## 2015-11-10 NOTE — Progress Notes (Signed)
Patient ID: Lydia Edwards, female   DOB: Feb 04, 1951, 64 y.o.   MRN: [id number] S:No complaints O:BP 136/77 mmHg  Pulse 68  Temp(Src) 99 F (37.2 C) (Oral)  Resp 17  Ht 5\' 3"  (1.6 m)  Wt 80.8 kg (178 lb 2.1 oz)  BMI 31.56 kg/m2  SpO2 98%  LMP   Intake/Output Summary (Last 24 hours) at 11/10/15 1130 Last data filed at 11/10/15 0837  Gross per 24 hour  Intake    600 ml  Output    200 ml  Net    400 ml   Intake/Output: I/O last 3 completed shifts: In: 1200 [P.O.:1200] Out: 300 [Urine:300]  Intake/Output this shift:  Total I/O In: 240 [P.O.:240] Out: 100 [Urine:100] Weight change:  Gen:WD WN AAF in NAD CVS: no rub Resp:cta [id number] pretibial edema   Recent Labs Lab 11/04/15 0645 11/05/15 0503 11/06/15 0640 11/06/15 1758 11/07/15 0257 11/08/15 0540 11/09/15 0435  NA 137 137 137 139 137 139 136  K 3.8 3.9 3.8 3.8 3.8 4.1 3.6  CL 105 104 104 104 104 104 101  CO2 20* 18* 21* 23 28 25 28   GLUCOSE 97 91 96 96 100* 94 103*  BUN 92* 93* 68* 69* 36* 47* 30*  CREATININE 10.11* 10.17* 8.92* 9.27* 7.28* 8.94* 7.38*  ALBUMIN 1.6*  --  1.6* 1.7* 1.5* 1.5* 1.5*  CALCIUM 7.6* 7.7* 7.6* 8.0* 7.4* 7.7* 7.5*  PHOS 10.2*  --  9.1* 9.1* 6.3* 7.9* 6.7*   Liver Function Tests:  Recent Labs Lab 11/07/15 0257 11/08/15 0540 11/09/15 0435  ALBUMIN 1.5* 1.5* 1.5*   No results for input(s): LIPASE, AMYLASE in the last 168 hours. No results for input(s): AMMONIA in the last 168 hours. CBC:  Recent Labs Lab 11/05/15 0503 11/06/15 1415 11/07/15 0257 11/08/15 0540 11/09/15 0435  WBC 10.2 12.8* 12.1* 11.6* 12.0*  HGB 10.7* 11.6* 11.5* 11.4* 11.5*  HCT 32.9* 34.8* 34.1* 34.1* 34.9*  MCV 81.4 81.5 81.4 82.2 82.9  PLT 348 343 266 245 209   Cardiac Enzymes: No results for input(s): CKTOTAL, CKMB, CKMBINDEX, TROPONINI in the last 168 hours. CBG: No results for input(s): GLUCAP in the last 168 hours.  Iron Studies: No results for input(s): IRON, TIBC,  TRANSFERRIN, FERRITIN in the last 72 hours. Studies/Results: No results found. Marland Kitchen atorvastatin  40 mg Oral Daily  . benzonatate  100 mg Oral BID    BMET    Component Value Date/Time   NA 136 11/09/2015 0435   K 3.6 11/09/2015 0435   CL 101 11/09/2015 0435   CO2 28 11/09/2015 0435   GLUCOSE 103* 11/09/2015 0435   BUN 30* 11/09/2015 0435   CREATININE 7.38* 11/09/2015 0435   CREATININE 4.92* 10/27/2015 1218   CALCIUM 7.5* 11/09/2015 0435   GFRNONAA 5* 11/09/2015 0435   GFRNONAA 9* 10/27/2015 1218   GFRAA 6* 11/09/2015 0435   GFRAA 10* 10/27/2015 1218   CBC    Component Value Date/Time   WBC 12.0* 11/09/2015 0435   RBC 4.21 11/09/2015 0435   HGB 11.5* 11/09/2015 0435   HCT 34.9* 11/09/2015 0435   PLT 209 11/09/2015 0435   MCV 82.9 11/09/2015 0435   MCH 27.3 11/09/2015 0435   MCHC 33.0 11/09/2015 0435   RDW 13.7 11/09/2015 0435   LYMPHSABS 2.3 10/28/2015 0901   MONOABS 0.8 10/28/2015 0901   EOSABS 0.8* 10/28/2015 0901   BASOSABS 0.2* 10/28/2015 0901   Background per Dr. Sanda Klein note from 11/09/15 64 y.o. female sent  by PCP to ED for acute renal failure. Had nausea, vomiting, diarrhea, chills and cough since Thanksgiving. Had been taking some Advil and daily Lotensin and Hydrodiuril for BP. On 10/06/15 creat was 0.78 mg/dl. On 10/27/15 creat was 4.92 rising to a high of 10. UA > 300 mg pro and 6-30 RBCs and 0-5 WBCs. Renal ultrasound 12.8cm R and 14.5 cm L echogenic kidneys. CXR LLL PNA. Despite IVF/discontinuation of offending medicines/treatment of PNA no improvement in renal fx.. Dialysis initiated 11/05/15 and renal biopsy done 11/07/15. ANA, ANCA, complements, Hep serologies all neg.   Assessment/Plan:  1. AKI- oliguric and requiring HD (first HD 11/05/15 and has had 3 sessions).  +protein/blood on UA, negative ANA, ANCA, normal complements.  S/p percutaneous biopsy of left kidney 11/07/15.  Scr pending for today and likely HD tomorrow.  Await renal biopsy  results. 2. CAP- completed rocephin/azithro and po levaquin. 3. HTN- stable 4. Vascular access- has RIJ Temporary/untunneled HD cath.  May need tunneled HD catheter if renal biopsy does not show reversible etiology.  [name]

## 2015-11-10 NOTE — Progress Notes (Signed)
[facility] PROGRESS NOTE  Lydia Edwards [id number] DOB: 27-May-1951 DOA: 10/28/2015 PCP: Alesia Richards, MD   64/F with PMH of preDM, HTN presented to ER with abnormal labs/AKI and pneumonia. -She was in Oliguric ATN for 4days, Renal following -Hydrated for 3days initially , since she was 8-10L positive and hence IVF stopped -Started making some urine in last 2days albeit 300-400cc now -Creatinine still very high but rate of rise improved -Stable from pneumonia standpoint and completed antibiotics.  - she reports persistent cough.   Assessment/Plan: 1. AKI/ ATN, Oliguric -due to dehydration with ACE/Diuretic and NSAID use, unable to rule out immune mediated GN too -Renal US without hydronephrosis -creatinine worsening, was oliguric for 4days, started having urine output.   -Stopped HCTZ, ACE on admission, hydrated for almost 3d, 8-10L positive, stopped IVF-on 12/3 - currently fluid overloaded and started on lasix.  - underwent HD as per renal on 12/7 and 12/8,  and  Underwent renal biopsy on Friday. Results are pending.   2. CAP -completed the treatment for pneumonia.  - repeat CXR as she continues to have cough.   3. PRe DM -stable   4. HTN -stable, ACE/HCTZ on hold  DVT proph: lovenox  Code Status: Full Code Family Communication: none at bedside Disposition Plan: home when creatinine improves   Consultants: Renal  HPI/Subjective: Nauseated last night, feeling better.   Objective: Filed Vitals:   11/10/15 0809 11/10/15 1514  BP: 136/77 144/46  Pulse: 68 66  Temp: 99 F (37.2 C) 98.7 F (37.1 C)  Resp: 17 17    Intake/Output Summary (Last 24 hours) at 11/10/15 1537 Last data filed at 11/10/15 1413  Gross per 24 hour  Intake    600 ml  Output    125 ml  Net    475 ml   Filed Weights   11/07/15 2000 11/08/15 0730 11/08/15 1045  Weight: 84.6 kg (186 lb 8.2 oz) 82.8 kg (182 lb 8.7 oz) 80.8 kg (178 lb 2.1 oz)    Exam:   General:   AAOx3, no distress  Cardiovascular: S1S2/RRR  Respiratory: Clear today  Abdomen: soft, NT, BS present  Musculoskeletal: trace edema c/c   Data Reviewed: Basic Metabolic Panel:  Recent Labs Lab 11/06/15 0640 11/06/15 1758 11/07/15 0257 11/08/15 0540 11/09/15 0435  NA 137 139 137 139 136  K 3.8 3.8 3.8 4.1 3.6  CL 104 104 104 104 101  CO2 21* 23 28 25 28   GLUCOSE 96 96 100* 94 103*  BUN 68* 69* 36* 47* 30*  CREATININE 8.92* 9.27* 7.28* 8.94* 7.38*  CALCIUM 7.6* 8.0* 7.4* 7.7* 7.5*  PHOS 9.1* 9.1* 6.3* 7.9* 6.7*   Liver Function Tests:  Recent Labs Lab 11/06/15 0640 11/06/15 1758 11/07/15 0257 11/08/15 0540 11/09/15 0435  ALBUMIN 1.6* 1.7* 1.5* 1.5* 1.5*   No results for input(s): LIPASE, AMYLASE in the last 168 hours. No results for input(s): AMMONIA in the last 168 hours. CBC:  Recent Labs Lab 11/05/15 0503 11/06/15 1415 11/07/15 0257 11/08/15 0540 11/09/15 0435  WBC 10.2 12.8* 12.1* 11.6* 12.0*  HGB 10.7* 11.6* 11.5* 11.4* 11.5*  HCT 32.9* 34.8* 34.1* 34.1* 34.9*  MCV 81.4 81.5 81.4 82.2 82.9  PLT 348 343 266 245 209   Cardiac Enzymes: No results for input(s): CKTOTAL, CKMB, CKMBINDEX, TROPONINI in the last 168 hours. BNP (last 3 results) No results for input(s): BNP in the last 8760 hours.  ProBNP (last 3 results) No results for input(s): PROBNP in the last  8760 hours.  CBG: No results for input(s): GLUCAP in the last 168 hours.  No results found for this or any previous visit (from the past 240 hour(s)).   Studies: No results found.  Scheduled Meds: . atorvastatin  40 mg Oral Daily  . benzonatate  100 mg Oral BID   Continuous Infusions:   Antibiotics Given (last 72 hours)    None      Principal Problem:   Acute kidney injury ([place]) Active Problems:   Hypertension   Cough   AKI (acute kidney injury) ([place])    Time spent: 40min    Logon Uttech  [facility] Pager [phone] 7PM-7AM, please contact night-coverage  at [email or web address], password [facility] 11/10/2015, 3:37 PM  LOS: 12 days

## 2015-11-11 LAB — RENAL FUNCTION PANEL
ALBUMIN: 1.7 g/dL — AB (ref 3.5–5.0)
Anion gap: 12 (ref 5–15)
BUN: 48 mg/dL — AB (ref 6–20)
CHLORIDE: 101 mmol/L (ref 101–111)
CO2: 23 mmol/L (ref 22–32)
CREATININE: 7.36 mg/dL — AB (ref 0.44–1.00)
Calcium: 8.1 mg/dL — ABNORMAL LOW (ref 8.9–10.3)
GFR calc Af Amer: 6 mL/min — ABNORMAL LOW (ref >60)
GFR, EST NON AFRICAN AMERICAN: 5 mL/min — AB (ref >60)
Glucose, Bld: 118 mg/dL — ABNORMAL HIGH (ref 65–99)
Phosphorus: 8.1 mg/dL — ABNORMAL HIGH (ref 2.5–4.6)
Potassium: 3.1 mmol/L — ABNORMAL LOW (ref 3.5–5.1)
Sodium: 136 mmol/L (ref 135–145)

## 2015-11-11 LAB — CBC
HCT: 37.6 % (ref 36.0–46.0)
Hemoglobin: 12.2 g/dL (ref 12.0–15.0)
MCH: 26.8 pg (ref 26.0–34.0)
MCHC: 32.4 g/dL (ref 30.0–36.0)
MCV: 82.5 fL (ref 78.0–100.0)
PLATELETS: 234 10*3/uL (ref 150–400)
RBC: 4.56 MIL/uL (ref 3.87–5.11)
RDW: 13.5 % (ref 11.5–15.5)
WBC: 10.3 10*3/uL (ref 4.0–10.5)

## 2015-11-11 NOTE — Progress Notes (Signed)
[facility] PROGRESS NOTE  Lydia Edwards [id number] DOB: November 02, 1951 DOA: 10/28/2015 PCP: Alesia Richards, MD   64/F with PMH of preDM, HTN presented to ER with abnormal labs/AKI and pneumonia. -She was in Oliguric ATN for 4days, Renal following -Hydrated for 3days initially , since she was 8-10L positive and hence IVF stopped -Started making some urine in last 2days albeit 300-400cc now -Creatinine still very high but rate of rise improved -Stable from pneumonia standpoint and completed antibiotics.  - she reports persistent cough.   Assessment/Plan: 1. AKI/ ATN, Oliguric -due to dehydration with ACE/Diuretic and NSAID use, unable to rule out immune mediated GN too -Renal US without hydronephrosis -creatinine worsening, was oliguric for 4days, started having urine output.   -Stopped HCTZ, ACE on admission, hydrated for almost 3d, 8-10L positive, stopped IVF-on 12/3 - currently fluid overloaded and started on lasix.  - underwent HD as per renal on 12/7 and 12/8,  and  Underwent renal biopsy on Friday. Results are pending.   2. CAP -completed the treatment for pneumonia.  - repeat CXR ordered as she continues to have cough.  - it shows bronchitis.   3. PRe DM -stable   4. HTN -stable, ACE/HCTZ on hold  DVT proph: lovenox  Code Status: Full Code Family Communication: none at bedside Disposition Plan: home when creatinine improves   Consultants: Renal  HPI/Subjective: Nauseated last night, feeling better.   Objective: Filed Vitals:   11/11/15 0913 11/11/15 1600  BP: 132/51 148/79  Pulse: 65 76  Temp: 98.1 F (36.7 C) 98 F (36.7 C)  Resp: 17 16    Intake/Output Summary (Last 24 hours) at 11/11/15 1829 Last data filed at 11/11/15 1815  Gross per 24 hour  Intake    960 ml  Output    125 ml  Net    835 ml   Filed Weights   11/08/15 0730 11/08/15 1045 11/10/15 2121  Weight: 82.8 kg (182 lb 8.7 oz) 80.8 kg (178 lb 2.1 oz) 81.1 kg (178 lb  12.7 oz)    Exam:   General:  AAOx3, no distress  Cardiovascular: S1S2/RRR  Respiratory: Clear today  Abdomen: soft, NT, BS present  Musculoskeletal: trace edema c/c   Data Reviewed: Basic Metabolic Panel:  Recent Labs Lab 11/06/15 1758 11/07/15 0257 11/08/15 0540 11/09/15 0435 11/11/15 0945  NA 139 137 139 136 136  K 3.8 3.8 4.1 3.6 3.1*  CL 104 104 104 101 101  CO2 23 28 25 28 23   GLUCOSE 96 100* 94 103* 118*  BUN 69* 36* 47* 30* 48*  CREATININE 9.27* 7.28* 8.94* 7.38* 7.36*  CALCIUM 8.0* 7.4* 7.7* 7.5* 8.1*  PHOS 9.1* 6.3* 7.9* 6.7* 8.1*   Liver Function Tests:  Recent Labs Lab 11/06/15 1758 11/07/15 0257 11/08/15 0540 11/09/15 0435 11/11/15 0945  ALBUMIN 1.7* 1.5* 1.5* 1.5* 1.7*   No results for input(s): LIPASE, AMYLASE in the last 168 hours. No results for input(s): AMMONIA in the last 168 hours. CBC:  Recent Labs Lab 11/06/15 1415 11/07/15 0257 11/08/15 0540 11/09/15 0435 11/11/15 0945  WBC 12.8* 12.1* 11.6* 12.0* 10.3  HGB 11.6* 11.5* 11.4* 11.5* 12.2  HCT 34.8* 34.1* 34.1* 34.9* 37.6  MCV 81.5 81.4 82.2 82.9 82.5  PLT 343 266 245 209 234   Cardiac Enzymes: No results for input(s): CKTOTAL, CKMB, CKMBINDEX, TROPONINI in the last 168 hours. BNP (last 3 results) No results for input(s): BNP in the last 8760 hours.  ProBNP (last 3 results) No results  for input(s): PROBNP in the last 8760 hours.  CBG: No results for input(s): GLUCAP in the last 168 hours.  No results found for this or any previous visit (from the past 240 hour(s)).   Studies: Dg Chest 2 View  11/10/2015  CLINICAL DATA:  Persistent productive cough for 2 weeks, followup, history hypertension EXAM: CHEST  2 VIEW COMPARISON:  10/28/2015 FINDINGS: RIGHT jugular triple-lumen central venous catheter with tip projecting in the RIGHT atrium. Enlargement of cardiac silhouette. Mediastinal contours and pulmonary vascularity normal. Chronic peribronchial thickening. No acute  infiltrate, pleural effusion or pneumothorax. Bones unremarkable. IMPRESSION: Enlargement of cardiac silhouette. Bronchitic changes without infiltrate. Electronically Signed   By: Lavonia Dana M.D.   On: 11/10/2015 16:27    Scheduled Meds: . atorvastatin  40 mg Oral Daily  . benzonatate  100 mg Oral BID   Continuous Infusions:   Antibiotics Given (last 72 hours)    None      Principal Problem:   Acute kidney injury ([place]) Active Problems:   Hypertension   Cough   AKI (acute kidney injury) ([place])    Time spent: 56min    Levent Kornegay  [facility] Pager [phone] 7PM-7AM, please contact night-coverage at [email or web address], password [facility] 11/11/2015, 6:29 PM  LOS: 13 days

## 2015-11-11 NOTE — Progress Notes (Addendum)
Patient ID: Lydia Edwards, female   DOB: 1951-08-09, 64 y.o.   MRN: [id number] S:feels well O:BP 139/64 mmHg  Pulse 62  Temp(Src) 98.6 F (37 C) (Oral)  Resp 18  Ht 5\' 3"  (1.6 m)  Wt 81.1 kg (178 lb 12.7 oz)  BMI 31.68 kg/m2  SpO2 99%  LMP   Intake/Output Summary (Last 24 hours) at 11/11/15 [id number] Last data filed at 11/11/15 0600  Gross per 24 hour  Intake    840 ml  Output     50 ml  Net    790 ml   Intake/Output: I/O last 3 completed shifts: In: 840 [P.O.:840] Out: 150 [Urine:150]  Intake/Output this shift:    Weight change:  Gen:WD WN AAF in NAD CVS:no rub Resp:cta [id number] Ext:no edema   Recent Labs Lab 11/05/15 0503 11/06/15 0640 11/06/15 1758 11/07/15 0257 11/08/15 0540 11/09/15 0435 11/11/15 0945  NA 137 137 139 137 139 136 136  K 3.9 3.8 3.8 3.8 4.1 3.6 3.1*  CL 104 104 104 104 104 101 101  CO2 18* 21* 23 28 25 28 23   GLUCOSE 91 96 96 100* 94 103* 118*  BUN 93* 68* 69* 36* 47* 30* 48*  CREATININE 10.17* 8.92* 9.27* 7.28* 8.94* 7.38* 7.36*  ALBUMIN  --  1.6* 1.7* 1.5* 1.5* 1.5* 1.7*  CALCIUM 7.7* 7.6* 8.0* 7.4* 7.7* 7.5* 8.1*  PHOS  --  9.1* 9.1* 6.3* 7.9* 6.7* 8.1*   Liver Function Tests:  Recent Labs Lab 11/07/15 0257 11/08/15 0540 11/09/15 0435  ALBUMIN 1.5* 1.5* 1.5*   No results for input(s): LIPASE, AMYLASE in the last 168 hours. No results for input(s): AMMONIA in the last 168 hours. CBC:  Recent Labs Lab 11/05/15 0503 11/06/15 1415 11/07/15 0257 11/08/15 0540 11/09/15 0435  WBC 10.2 12.8* 12.1* 11.6* 12.0*  HGB 10.7* 11.6* 11.5* 11.4* 11.5*  HCT 32.9* 34.8* 34.1* 34.1* 34.9*  MCV 81.4 81.5 81.4 82.2 82.9  PLT 348 343 266 245 209   Cardiac Enzymes: No results for input(s): CKTOTAL, CKMB, CKMBINDEX, TROPONINI in the last 168 hours. CBG: No results for input(s): GLUCAP in the last 168 hours.  Iron Studies: No results for input(s): IRON, TIBC, TRANSFERRIN, FERRITIN in the last 72 hours. Studies/Results: Dg Chest 2  View  11/10/2015  CLINICAL DATA:  Persistent productive cough for 2 weeks, followup, history hypertension EXAM: CHEST  2 VIEW COMPARISON:  10/28/2015 FINDINGS: RIGHT jugular triple-lumen central venous catheter with tip projecting in the RIGHT atrium. Enlargement of cardiac silhouette. Mediastinal contours and pulmonary vascularity normal. Chronic peribronchial thickening. No acute infiltrate, pleural effusion or pneumothorax. Bones unremarkable. IMPRESSION: Enlargement of cardiac silhouette. Bronchitic changes without infiltrate. Electronically Signed   By: Lavonia Dana M.D.   On: 11/10/2015 16:27   . atorvastatin  40 mg Oral Daily  . benzonatate  100 mg Oral BID    BMET    Component Value Date/Time   NA 136 11/09/2015 0435   K 3.6 11/09/2015 0435   CL 101 11/09/2015 0435   CO2 28 11/09/2015 0435   GLUCOSE 103* 11/09/2015 0435   BUN 30* 11/09/2015 0435   CREATININE 7.38* 11/09/2015 0435   CREATININE 4.92* 10/27/2015 1218   CALCIUM 7.5* 11/09/2015 0435   GFRNONAA 5* 11/09/2015 0435   GFRNONAA 9* 10/27/2015 1218   GFRAA 6* 11/09/2015 0435   GFRAA 10* 10/27/2015 1218   CBC    Component Value Date/Time   WBC 12.0* 11/09/2015 0435   RBC 4.21 11/09/2015 0435  HGB 11.5* 11/09/2015 0435   HCT 34.9* 11/09/2015 0435   PLT 209 11/09/2015 0435   MCV 82.9 11/09/2015 0435   MCH 27.3 11/09/2015 0435   MCHC 33.0 11/09/2015 0435   RDW 13.7 11/09/2015 0435   LYMPHSABS 2.3 10/28/2015 0901   MONOABS 0.8 10/28/2015 0901   EOSABS 0.8* 10/28/2015 0901   BASOSABS 0.2* 10/28/2015 0901     Assessment/Plan:  1. AKI- oliguric and requiring HD (first HD 11/05/15 and has had 3 sessions). +protein/blood on UA, negative ANA, ANCA, normal complements. S/p percutaneous biopsy of left kidney 11/07/15. Scr improved after HD on 11/08/15 and has remained stable.  Will hold off on HD and follow UOP and Scr. Await renal biopsy results. 1. 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  Will continue with supportive care for now. 2. CAP- completed rocephin/azithro and po levaquin. 3. HTN- stable 4. Vascular access- has RIJ Temporary/untunneled HD cath. May need tunneled HD catheter if renal biopsy does not show reversible etiology.  [place] A

## 2015-11-12 DIAGNOSIS — I10 Essential (primary) hypertension: Secondary | ICD-10-CM

## 2015-11-12 DIAGNOSIS — J189 Pneumonia, unspecified organism: Secondary | ICD-10-CM

## 2015-11-12 DIAGNOSIS — N170 Acute kidney failure with tubular necrosis: Principal | ICD-10-CM

## 2015-11-12 DIAGNOSIS — J209 Acute bronchitis, unspecified: Secondary | ICD-10-CM | POA: Diagnosis present

## 2015-11-12 HISTORY — DX: Pneumonia, unspecified organism: J18.9

## 2015-11-12 LAB — RENAL FUNCTION PANEL
ALBUMIN: 1.4 g/dL — AB (ref 3.5–5.0)
Anion gap: 10 (ref 5–15)
BUN: 54 mg/dL — AB (ref 6–20)
CHLORIDE: 104 mmol/L (ref 101–111)
CO2: 22 mmol/L (ref 22–32)
CREATININE: 6.18 mg/dL — AB (ref 0.44–1.00)
Calcium: 7.6 mg/dL — ABNORMAL LOW (ref 8.9–10.3)
GFR, EST AFRICAN AMERICAN: 7 mL/min — AB (ref >60)
GFR, EST NON AFRICAN AMERICAN: 6 mL/min — AB (ref >60)
Glucose, Bld: 101 mg/dL — ABNORMAL HIGH (ref 65–99)
PHOSPHORUS: 8 mg/dL — AB (ref 2.5–4.6)
Potassium: 3.3 mmol/L — ABNORMAL LOW (ref 3.5–5.1)
SODIUM: 136 mmol/L (ref 135–145)

## 2015-11-12 MED ORDER — BENZONATATE 100 MG PO CAPS
200.0000 mg | ORAL_CAPSULE | Freq: Three times a day (TID) | ORAL | Status: DC
  Administered 2015-11-12 – 2015-11-13 (×5): 200 mg via ORAL
  Filled 2015-11-12 (×5): qty 2

## 2015-11-12 MED ORDER — POTASSIUM CHLORIDE CRYS ER 20 MEQ PO TBCR
20.0000 meq | EXTENDED_RELEASE_TABLET | Freq: Once | ORAL | Status: AC
  Administered 2015-11-12: 20 meq via ORAL
  Filled 2015-11-12: qty 1

## 2015-11-12 NOTE — Progress Notes (Signed)
[facility] PROGRESS NOTE  Lydia Edwards [id number] DOB: Apr 27, 1951 DOA: 10/28/2015 PCP: Alesia Richards, MD  Brief interval history  64/F with PMH of preDM, HTN presented to ER with abnormal labs/AKI and pneumonia. -She was in Oliguric ATN for 4days, Renal following -Hydrated for 3days initially , since she was 8-10L positive and hence IVF stopped -Started making some urine in last 2days albeit 300-400cc now -Creatinine still very high but rate of rise improved -Stable from pneumonia standpoint and completed antibiotics.  - she reports persistent cough.      Assessment/Plan: #1 acute renal failure/ATN/oliguric Questionable etiology. Initially felt to be secondary to dehydration in the setting of nephrotoxic agents of ACE inhibitor/diuretics and NSAID use. Patient required hemodialysis first HD 11/05/2015 and had had 3 sessions. Urinalysis was positive for protein and blood. ANA negative. ANCA negative. Normal complements. Renal ultrasound negative for hydronephrosis. Patient status post percutaneous biopsy of the left kidney 11/07/2015 per interventional radiology. 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 Renal function slowly trending down. Hemodialysis on hold today per nephrology. Follow.  #2 community-acquired pneumonia Clinical improvement. Patient status post antibiotic treatment with Rocephin and azithromycin oral Levaquin. Repeat chest x-ray with resolution of pneumonia.  #3 hypertension Stable.  #4 prophylaxis Lovenox for DVT prophylaxis.   Code Status: Full Family Communication: Updated patient. No family at bedside. Disposition Plan: When renal function improves and per nephrology.   Consultants:  Nephrology: Dr. Florene Glen 10/29/2015  Interventional radiology Dr. Earleen Newport 11/06/2015  Procedures:  Chest x-ray 11/10/2015, 10/28/2015  Renal biopsy per interventional radiology  11/07/2015  Renal ultrasound 10/29/2015  Antibiotics:  IV azithromycin 10/28/2015>>>> 11/01/2015  IV Rocephin 10/28/2015>>>>> 11/01/2015  Levaquin 11/01/2015>>>> 11/05/2015  HPI/Subjective: Patient states she's feeling better. Coughing improved. No chest pain. No shortness of breath. Patient states she's making urine.  Objective: Filed Vitals:   11/12/15 0500 11/12/15 0907  BP: 123/64 114/80  Pulse: 60 104  Temp: 98.8 F (37.1 C) 98.5 F (36.9 C)  Resp: 16 17    Intake/Output Summary (Last 24 hours) at 11/12/15 1441 Last data filed at 11/12/15 1406  Gross per 24 hour  Intake    900 ml  Output     75 ml  Net    825 ml   Filed Weights   11/08/15 0730 11/08/15 1045 11/10/15 2121  Weight: 82.8 kg (182 lb 8.7 oz) 80.8 kg (178 lb 2.1 oz) 81.1 kg (178 lb 12.7 oz)    Exam:   General:  NAD  Cardiovascular: RRR  Respiratory: CTAB  Abdomen: Soft, nontender, nondistended, positive bowel sounds.  Musculoskeletal: No clubbing cyanosis or edema.  Data Reviewed: Basic Metabolic Panel:  Recent Labs Lab 11/07/15 0257 11/08/15 0540 11/09/15 0435 11/11/15 0945 11/12/15 0422  NA 137 139 136 136 136  K 3.8 4.1 3.6 3.1* 3.3*  CL 104 104 101 101 104  CO2 28 25 28 23 22   GLUCOSE 100* 94 103* 118* 101*  BUN 36* 47* 30* 48* 54*  CREATININE 7.28* 8.94* 7.38* 7.36* 6.18*  CALCIUM 7.4* 7.7* 7.5* 8.1* 7.6*  PHOS 6.3* 7.9* 6.7* 8.1* 8.0*   Liver Function Tests:  Recent Labs Lab 11/07/15 0257 11/08/15 0540 11/09/15 0435 11/11/15 0945 11/12/15 0422  ALBUMIN 1.5* 1.5* 1.5* 1.7* 1.4*   No results for input(s): LIPASE, AMYLASE in the last 168 hours. No results for input(s): AMMONIA in the last 168 hours. CBC:  Recent Labs Lab 11/06/15 1415 11/07/15 0257 11/08/15 0540 11/09/15  0435 11/11/15 0945  WBC 12.8* 12.1* 11.6* 12.0* 10.3  HGB 11.6* 11.5* 11.4* 11.5* 12.2  HCT 34.8* 34.1* 34.1* 34.9* 37.6  MCV 81.5 81.4 82.2 82.9 82.5  PLT 343 266 245 209 234    Cardiac Enzymes: No results for input(s): CKTOTAL, CKMB, CKMBINDEX, TROPONINI in the last 168 hours. BNP (last 3 results) No results for input(s): BNP in the last 8760 hours.  ProBNP (last 3 results) No results for input(s): PROBNP in the last 8760 hours.  CBG: No results for input(s): GLUCAP in the last 168 hours.  No results found for this or any previous visit (from the past 240 hour(s)).   Studies: Dg Chest 2 View  11/10/2015  CLINICAL DATA:  Persistent productive cough for 2 weeks, followup, history hypertension EXAM: CHEST  2 VIEW COMPARISON:  10/28/2015 FINDINGS: RIGHT jugular triple-lumen central venous catheter with tip projecting in the RIGHT atrium. Enlargement of cardiac silhouette. Mediastinal contours and pulmonary vascularity normal. Chronic peribronchial thickening. No acute infiltrate, pleural effusion or pneumothorax. Bones unremarkable. IMPRESSION: Enlargement of cardiac silhouette. Bronchitic changes without infiltrate. Electronically Signed   By: Lavonia Dana M.D.   On: 11/10/2015 16:27    Scheduled Meds: . atorvastatin  40 mg Oral Daily  . benzonatate  200 mg Oral TID   Continuous Infusions:   Principal Problem:   ARF (acute renal failure) ([facility]) Active Problems:   CAP (community acquired pneumonia)   Acute bronchitis   Hypertension   Cough   AKI (acute kidney injury) ([place])    Time spent: 72 minutes    Kylee Umana M.D. [facility] Pager [phone]. If 7PM-7AM, please contact night-coverage at [email or web address], password [facility] 11/12/2015, 2:41 PM  LOS: 14 days

## 2015-11-12 NOTE — Progress Notes (Signed)
Patient ID: Lydia Edwards, female   DOB: 11-22-1951, 64 y.o.   MRN: [id number] S:feels better O:BP 114/80 mmHg  Pulse 104  Temp(Src) 98.5 F (36.9 C) (Oral)  Resp 17  Ht 5\' 3"  (1.6 m)  Wt 81.1 kg (178 lb 12.7 oz)  BMI 31.68 kg/m2  SpO2 99%  LMP   Intake/Output Summary (Last 24 hours) at 11/12/15 1111 Last data filed at 11/12/15 0958  Gross per 24 hour  Intake    900 ml  Output     75 ml  Net    825 ml   Intake/Output: I/O last 3 completed shifts: In: 1020 [P.O.:1020] Out: 125 [Urine:125]  Intake/Output this shift:  Total I/O In: 240 [P.O.:240] Out: -  Weight change:  Gen:WD WN AAF in NAD CVS:no rub Resp:cta  [id number] Ext:no edema   Recent Labs Lab 11/06/15 0640 11/06/15 1758 11/07/15 0257 11/08/15 0540 11/09/15 0435 11/11/15 0945 11/12/15 0422  NA 137 139 137 139 136 136 136  K 3.8 3.8 3.8 4.1 3.6 3.1* 3.3*  CL 104 104 104 104 101 101 104  CO2 21* 23 28 25 28 23 22   GLUCOSE 96 96 100* 94 103* 118* 101*  BUN 68* 69* 36* 47* 30* 48* 54*  CREATININE 8.92* 9.27* 7.28* 8.94* 7.38* 7.36* 6.18*  ALBUMIN 1.6* 1.7* 1.5* 1.5* 1.5* 1.7* 1.4*  CALCIUM 7.6* 8.0* 7.4* 7.7* 7.5* 8.1* 7.6*  PHOS 9.1* 9.1* 6.3* 7.9* 6.7* 8.1* 8.0*   Liver Function Tests:  Recent Labs Lab 11/09/15 0435 11/11/15 0945 11/12/15 0422  ALBUMIN 1.5* 1.7* 1.4*   No results for input(s): LIPASE, AMYLASE in the last 168 hours. No results for input(s): AMMONIA in the last 168 hours. CBC:  Recent Labs Lab 11/06/15 1415 11/07/15 0257 11/08/15 0540 11/09/15 0435 11/11/15 0945  WBC 12.8* 12.1* 11.6* 12.0* 10.3  HGB 11.6* 11.5* 11.4* 11.5* 12.2  HCT 34.8* 34.1* 34.1* 34.9* 37.6  MCV 81.5 81.4 82.2 82.9 82.5  PLT 343 266 245 209 234   Cardiac Enzymes: No results for input(s): CKTOTAL, CKMB, CKMBINDEX, TROPONINI in the last 168 hours. CBG: No results for input(s): GLUCAP in the last 168 hours.  Iron Studies: No results for input(s): IRON, TIBC, TRANSFERRIN, FERRITIN in the last  72 hours. Studies/Results: Dg Chest 2 View  11/10/2015  CLINICAL DATA:  Persistent productive cough for 2 weeks, followup, history hypertension EXAM: CHEST  2 VIEW COMPARISON:  10/28/2015 FINDINGS: RIGHT jugular triple-lumen central venous catheter with tip projecting in the RIGHT atrium. Enlargement of cardiac silhouette. Mediastinal contours and pulmonary vascularity normal. Chronic peribronchial thickening. No acute infiltrate, pleural effusion or pneumothorax. Bones unremarkable. IMPRESSION: Enlargement of cardiac silhouette. Bronchitic changes without infiltrate. Electronically Signed   By: Lavonia Dana M.D.   On: 11/10/2015 16:27   . atorvastatin  40 mg Oral Daily  . benzonatate  200 mg Oral TID    BMET    Component Value Date/Time   NA 136 11/12/2015 0422   K 3.3* 11/12/2015 0422   CL 104 11/12/2015 0422   CO2 22 11/12/2015 0422   GLUCOSE 101* 11/12/2015 0422   BUN 54* 11/12/2015 0422   CREATININE 6.18* 11/12/2015 0422   CREATININE 4.92* 10/27/2015 1218   CALCIUM 7.6* 11/12/2015 0422   GFRNONAA 6* 11/12/2015 0422   GFRNONAA 9* 10/27/2015 1218   GFRAA 7* 11/12/2015 0422   GFRAA 10* 10/27/2015 1218   CBC    Component Value Date/Time   WBC 10.3 11/11/2015 0945   RBC 4.56  11/11/2015 0945   HGB 12.2 11/11/2015 0945   HCT 37.6 11/11/2015 0945   PLT 234 11/11/2015 0945   MCV 82.5 11/11/2015 0945   MCH 26.8 11/11/2015 0945   MCHC 32.4 11/11/2015 0945   RDW 13.5 11/11/2015 0945   LYMPHSABS 2.3 10/28/2015 0901   MONOABS 0.8 10/28/2015 0901   EOSABS 0.8* 10/28/2015 0901   BASOSABS 0.2* 10/28/2015 0901     Assessment/Plan:  1. AKI- oliguric and requiring HD (first HD 11/05/15 and has had 3 sessions). +protein/blood on UA, negative ANA, ANCA, normal complements. S/p percutaneous biopsy of left kidney 11/07/15. Scr improved after HD on 11/08/15 and has remained stable. Will hold off on HD and follow UOP and Scr. 1. 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  2. Scr continues to slowly improve and if Scr continues to drop tomorrow will d/c HD cath 3. Will continue with supportive care for now and no indication for dialysis at this time. 2. CAP- completed rocephin/azithro and po levaquin. 3. HTN- stable 4. Vascular access- has RIJ Temporary/untunneled HD cath. Will remove HD catheter tomorrow if renal function continues to improve.  5. Disposition - would like to see a consistent drop in Scr before she can be discharged to home.  [place] A

## 2015-11-13 DIAGNOSIS — E860 Dehydration: Secondary | ICD-10-CM

## 2015-11-13 LAB — RENAL FUNCTION PANEL
Albumin: 1.4 g/dL — ABNORMAL LOW (ref 3.5–5.0)
Anion gap: 10 (ref 5–15)
BUN: 54 mg/dL — AB (ref 6–20)
CALCIUM: 7.8 mg/dL — AB (ref 8.9–10.3)
CHLORIDE: 108 mmol/L (ref 101–111)
CO2: 21 mmol/L — AB (ref 22–32)
CREATININE: 4.08 mg/dL — AB (ref 0.44–1.00)
GFR calc Af Amer: 12 mL/min — ABNORMAL LOW (ref >60)
GFR, EST NON AFRICAN AMERICAN: 11 mL/min — AB (ref >60)
Glucose, Bld: 95 mg/dL (ref 65–99)
Phosphorus: 6.8 mg/dL — ABNORMAL HIGH (ref 2.5–4.6)
Potassium: 3.3 mmol/L — ABNORMAL LOW (ref 3.5–5.1)
SODIUM: 139 mmol/L (ref 135–145)

## 2015-11-13 LAB — CBC
HCT: 35.3 % — ABNORMAL LOW (ref 36.0–46.0)
Hemoglobin: 11.5 g/dL — ABNORMAL LOW (ref 12.0–15.0)
MCH: 26.8 pg (ref 26.0–34.0)
MCHC: 32.6 g/dL (ref 30.0–36.0)
MCV: 82.3 fL (ref 78.0–100.0)
PLATELETS: 192 10*3/uL (ref 150–400)
RBC: 4.29 MIL/uL (ref 3.87–5.11)
RDW: 13.5 % (ref 11.5–15.5)
WBC: 9.4 10*3/uL (ref 4.0–10.5)

## 2015-11-13 MED ORDER — BENZONATATE 200 MG PO CAPS: 200.0000 mg | ORAL_CAPSULE | Freq: Three times a day (TID) | ORAL | Status: DC | PRN

## 2015-11-13 NOTE — Progress Notes (Signed)
Patient ID: Lydia Edwards, female   DOB: 08/16/1951, 64 y.o.   MRN: [id number] S:feels much better and states that she is urinating quite often and doesn't know why none has been recorded O:BP 129/84 mmHg  Pulse 96  Temp(Src) 98.3 F (36.8 C) (Oral)  Resp 18  Ht 5\' 3"  (1.6 m)  Wt 80.5 kg (177 lb 7.5 oz)  BMI 31.45 kg/m2  SpO2 98%  LMP   Intake/Output Summary (Last 24 hours) at 11/13/15 0852 Last data filed at 11/13/15 0557  Gross per 24 hour  Intake    720 ml  Output      0 ml  Net    720 ml   Intake/Output: I/O last 3 completed shifts: In: 780 [P.O.:780] Out: 0   Intake/Output this shift:    Weight change:  Gen:WD WN AAF in NAD CVS:no rub Resp:cta [id number] Ext:no edema   Recent Labs Lab 11/06/15 1758 11/07/15 0257 11/08/15 0540 11/09/15 0435 11/11/15 0945 11/12/15 0422 11/13/15 0737  NA 139 137 139 136 136 136 139  K 3.8 3.8 4.1 3.6 3.1* 3.3* 3.3*  CL 104 104 104 101 101 104 108  CO2 23 28 25 28 23 22  21*  GLUCOSE 96 100* 94 103* 118* 101* 95  BUN 69* 36* 47* 30* 48* 54* 54*  CREATININE 9.27* 7.28* 8.94* 7.38* 7.36* 6.18* 4.08*  ALBUMIN 1.7* 1.5* 1.5* 1.5* 1.7* 1.4* 1.4*  CALCIUM 8.0* 7.4* 7.7* 7.5* 8.1* 7.6* 7.8*  PHOS 9.1* 6.3* 7.9* 6.7* 8.1* 8.0* 6.8*   Liver Function Tests:  Recent Labs Lab 11/09/15 0435 11/11/15 0945 11/12/15 0422  ALBUMIN 1.5* 1.7* 1.4*   No results for input(s): LIPASE, AMYLASE in the last 168 hours. No results for input(s): AMMONIA in the last 168 hours. CBC:  Recent Labs Lab 11/07/15 0257 11/08/15 0540 11/09/15 0435 11/11/15 0945 11/13/15 0737  WBC 12.1* 11.6* 12.0* 10.3 9.4  HGB 11.5* 11.4* 11.5* 12.2 11.5*  HCT 34.1* 34.1* 34.9* 37.6 35.3*  MCV 81.4 82.2 82.9 82.5 82.3  PLT 266 245 209 234 192   Cardiac Enzymes: No results for input(s): CKTOTAL, CKMB, CKMBINDEX, TROPONINI in the last 168 hours. CBG: No results for input(s): GLUCAP in the last 168 hours.  Iron Studies: No results for input(s): IRON,  TIBC, TRANSFERRIN, FERRITIN in the last 72 hours. Studies/Results: No results found. Marland Kitchen atorvastatin  40 mg Oral Daily  . benzonatate  200 mg Oral TID    BMET    Component Value Date/Time   NA 139 11/13/2015 0737   K 3.3* 11/13/2015 0737   CL 108 11/13/2015 0737   CO2 21* 11/13/2015 0737   GLUCOSE 95 11/13/2015 0737   BUN 54* 11/13/2015 0737   CREATININE 4.08* 11/13/2015 0737   CREATININE 4.92* 10/27/2015 1218   CALCIUM 7.8* 11/13/2015 0737   GFRNONAA 11* 11/13/2015 0737   GFRNONAA 9* 10/27/2015 1218   GFRAA 12* 11/13/2015 0737   GFRAA 10* 10/27/2015 1218   CBC    Component Value Date/Time   WBC 9.4 11/13/2015 0737   RBC 4.29 11/13/2015 0737   HGB 11.5* 11/13/2015 0737   HCT 35.3* 11/13/2015 0737   PLT 192 11/13/2015 0737   MCV 82.3 11/13/2015 0737   MCH 26.8 11/13/2015 0737   MCHC 32.6 11/13/2015 0737   RDW 13.5 11/13/2015 0737   LYMPHSABS 2.3 10/28/2015 0901   MONOABS 0.8 10/28/2015 0901   EOSABS 0.8* 10/28/2015 0901   BASOSABS 0.2* 10/28/2015 0901     Assessment/Plan:  1. AKI- oliguric and requiring HD (first HD 11/05/15 and has had 3 sessions). +protein/blood on UA, negative ANA, ANCA, normal complements. S/p percutaneous biopsy of left kidney 11/07/15. Scr improved after HD on 11/08/15 and has remained stable. Will hold off on HD and follow UOP and Scr. 1. 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  2. Scr continues to improve 3. d/c HD cath 4. Will continue with supportive care for now and no indication for dialysis at this time. 5. Stable for discharge and will set up outpatient follow up with Dr. Lorrene Reid 2. CAP- completed rocephin/azithro and po levaquin. 3. HTN- stable 4. Vascular access- has RIJ Temporary/untunneled HD cath. Will remove HD catheter today.  5. Disposition - will need close follow up with her PCP and will set up outpatient follow up with our office 12/10/15 at  3:30 but will need weekly labs to follow renal function.  [place] A

## 2015-11-13 NOTE — Care Management Important Message (Signed)
Important Message  Patient Details  Name: Lydia Edwards MRN: [id number] Date of Birth: 22-Oct-1951   Medicare Important Message Given:  Yes    Louanne Belton 11/13/2015, 2:29 [place] Message  Patient Details  Name: Lydia Edwards MRN: [id number] Date of Birth: March 05, 1951   Medicare Important Message Given:  Yes    Hau Sanor G 11/13/2015, 2:29 PM

## 2015-11-13 NOTE — Discharge Summary (Signed)
Physician Discharge Summary  Lydia Edwards [id number] DOB: 1951-05-02 DOA: 10/28/2015  PCP: Alesia Richards, MD  Admit date: 10/28/2015 Discharge date: 11/13/2015  Time spent: 65 minutes  Recommendations for Outpatient Follow-up:  1. Follow-up with Alesia Richards, MD in 1 week. On follow-up patient will need a renal function panel done to follow-up on electrolytes and renal function. Patient also need weekly renal function panels done per nephrology recommendations. Patient's blood pressure need to be reassessed as patient's antihypertensive medications were discontinued during the hospitalization. If further blood pressure control is needed may consider starting patient on Norvasc. 2. Follow-up with Dr. Lorrene Reid of nephrology on 12/10/2015 at 3:30 PM for follow-up on her renal failure.   Discharge Diagnoses:  Principal Problem:   ARF (acute renal failure) ([place]) Active Problems:   CAP (community acquired pneumonia)   Acute bronchitis   Hypertension   Cough   AKI (acute kidney injury) ([place])   Discharge Condition: Stable and improved  Diet recommendation: Regular  Filed Weights   11/08/15 1045 11/10/15 2121 11/12/15 2010  Weight: 80.8 kg (178 lb 2.1 oz) 81.1 kg (178 lb 12.7 oz) 80.5 kg (177 lb 7.5 oz)    History of present illness:  Per Dr Lajuana Matte is a 64 y.o. female who was sent by PCP to ED for acute renal failure. Patient saw PCP one day prior to admission for nausea, vomiting, diarrhea, chills and cough since Thanksgiving. PCP prescribed Zithromax, obtained labs.   Patient had been coughing up phlegm. She believed the vomiting is really secondary to coughing. Stools are somewhat loose but only twice a day, no significant diarrhea. Patient had not checked her temp at home but did feel hot at times and having intermittent chills. Someone at work was sick but patient just felt she had a cold. Patient got flu shot in October    Hospital  Course:  #1 acute renal failure/ATN/oliguric Questionable etiology. Patient was admitted from PCPs office in acute renal failure. Creatinine on admission was 5.74 and went up as high as 10.17. Initially felt to be secondary to dehydration in the setting of nephrotoxic agents of ACE inhibitor/diuretics and NSAID use. Nephrology was consulted and patient was seen in consultation by Dr. Florene Glen. 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 Patient was initially placed on supportive care with IV fluids antibiotics and cessation of nephrotoxins. Patient required hemodialysis first HD 11/05/2015 and had had 3 sessions. Urinalysis was positive for protein and blood. ANA negative. ANCA negative. Normal complements. Renal ultrasound negative for hydronephrosis. Patient subsequently underwent percutaneous biopsy of the left kidney 11/07/2015 per interventional radiology. 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 Renal function slowly trended down. Patient hemodialysis catheter was subsequently removed. Recommended to continue supportive care and no need for dialysis at this time. Patient had good urinary output and renal function trended down such that by day of discharge patient's creatinine was 4.08. It was felt per nephrology the patient was stable for discharge and will need to follow-up with Dr. Lorrene Reid of [facility] on 12/10/2015 at 3:30 PM. Was recommended that patient will likely need weekly labs to follow renal function. Patient was discharged in stable and improved condition.  #2 community-acquired pneumonia Patient on admission had presented with upper respiratory symptoms including a productive cough. White count on admission was 25,000. Chest x-ray done on admission  was was consistent with a new left lower  lobe pneumonia. Patient was placed empirically on IV Rocephin and azithromycin. Patient improved clinically and was subsequently transitioned to oral Levaquin. Patient received a full dose of antibiotic treatment. Repeat chest x-ray done showed resolution of pneumonia. Patient be discharged home in stable and improved condition. Outpatient follow-up.   #3 hypertension  Patient antihypertensive medications were held during the hospitalization secondary to ARF. Patient's BP remained stable. Outpatient follow up.  Procedures:  Chest x-ray 11/10/2015, 10/28/2015  Renal biopsy per interventional radiology 11/07/2015  Renal ultrasound 10/29/2015    Consultations:  Nephrology: Dr. Florene Glen 10/29/2015  Interventional radiology Dr. Earleen [place] 11/06/2015  Discharge Exam: Filed Vitals:   11/13/15 0414 11/13/15 0848  BP: 139/58 129/84  Pulse: 79 96  Temp: 98.7 F (37.1 C) 98.3 F (36.8 C)  Resp: 20 18    General: NAD Cardiovascular: RRR Respiratory: CTAB  Discharge Instructions   Discharge Instructions    Diet general    Complete by:  As directed      Discharge instructions    Complete by:  As directed   Follow up with MCKEOWN,WILLIAM DAVID, MD in 1 week. Follow up with nephrology, Dr Lorrene Reid 12/10/15 at 330pm     Increase activity slowly    Complete by:  As directed           Current Discharge Medication List    START taking these medications   Details  benzonatate (TESSALON) 200 MG capsule Take 1 capsule (200 mg total) by mouth 3 (three) times daily as needed for cough. Qty: 20 capsule, Refills: 0      CONTINUE these medications which have NOT CHANGED   Details  acetaminophen (TYLENOL) 500 MG tablet Take 500 mg by mouth every 6 (six) hours as needed for mild pain or headache.     atorvastatin (LIPITOR) 40 MG tablet Take 1 tablet (40 mg total) by mouth daily. Qty: 30 tablet, Refills: 3    Esomeprazole Magnesium (NEXIUM PO) Take 1 capsule by mouth daily.     promethazine (PHENERGAN) 25 MG tablet Take 1 tablet (25 mg total) by mouth every 6 (six) hours as needed for nausea or vomiting. Qty: 30 tablet, Refills: 3      STOP taking these medications     azithromycin (ZITHROMAX) 250 MG tablet      benazepril (LOTENSIN) 40 MG tablet      benazepril (LOTENSIN) 40 MG tablet      hydrochlorothiazide (HYDRODIURIL) 25 MG tablet        Allergies  Allergen Reactions  . Naproxen     REACTION: renal failure  . Nsaids    Follow-up Information    Follow up with MCKEOWN,WILLIAM DAVID, MD. Schedule an appointment as soon as possible for a visit in 1 week.   Specialty:  Internal Medicine   Contact information:   [address] [place] [address] [phone]       Follow up with Lucrezia Starch, MD On 12/10/2015.   Specialty:  Nephrology   Why:  f/u at 330pm   Contact information:   [place] [address] [phone]        The results of significant diagnostics from this hospitalization (including imaging, microbiology, ancillary and laboratory) are listed below for reference.    Significant Diagnostic Studies: Dg Chest 2 View  11/10/2015  CLINICAL DATA:  Persistent productive cough for 2 weeks, followup, history hypertension EXAM: CHEST  2 VIEW COMPARISON:  10/28/2015 FINDINGS:  RIGHT jugular triple-lumen central venous catheter with tip projecting in the RIGHT atrium. Enlargement of cardiac silhouette. Mediastinal contours and pulmonary vascularity normal. Chronic peribronchial thickening. No acute infiltrate, pleural effusion or pneumothorax. Bones unremarkable. IMPRESSION: Enlargement of cardiac silhouette. Bronchitic changes without infiltrate. Electronically Signed   By: Lavonia Dana M.D.   On: 11/10/2015 16:27   Dg Chest 2 View  10/28/2015  CLINICAL DATA:  Cough and nausea.  Vomiting. EXAM: CHEST - 2 VIEW COMPARISON:  Two-view chest x-ray 10/05/2011 FINDINGS: The heart is enlarged. The left lower  lobe pneumonia is knee. A small effusion is associated. There is no edema to suggest failure. No other focal airspace disease is present. The visualized soft tissues and bony thorax are unremarkable. IMPRESSION: 1. New left lower lobe pneumonia. 2. Cardiomegaly without failure. Electronically Signed   By: San Morelle M.D.   On: 10/28/2015 13:06   US Renal  10/29/2015  CLINICAL DATA:  Acute kidney insufficiency EXAM: RENAL / URINARY TRACT ULTRASOUND COMPLETE COMPARISON:  10/30/2009 CT scan of the abdomen and pelvis FINDINGS: Right Kidney: Length: 12.8 cm. There is increased echogenicity suspicious for medical renal disease. No hydronephrosis or renal calculus. Left Kidney: Length: 14.5 cm. Increased cortical echogenicity suspicious for medical renal disease. No hydronephrosis or renal calculus. Bladder: decompressed with Foley catheter. IMPRESSION: 1. Bilateral echogenic kidneys suspicious for medical renal disease. No hydronephrosis or renal calculus. Electronically Signed   By: Lahoma Crocker M.D.   On: 10/29/2015 16:50   US Biopsy  11/07/2015  CLINICAL DATA:  Proteinuria, acute renal failure EXAM: ULTRASOUND GUIDED CORE BIOPSY OF LEFT KIDNEY MEDICATIONS: 1.0 mg IV Versed; 50 mcg IV Fentanyl Total Moderate Sedation Time: 10 MINUTES PROCEDURE: The procedure, risks, benefits, and alternatives were explained to the patient. Questions regarding the procedure were encouraged and answered. The patient understands and consents to the procedure. The LEFT POSTERIOR FLANK was prepped with CHLORAPREP in a sterile fashion, and a sterile drape was applied covering the operative field. A sterile gown and sterile gloves were used for the procedure. Local anesthesia was provided with 1% Lidocaine. Patient positioned prone. Preliminary ultrasound performed. Left kidney lower pole was localized. Under sterile conditions and local anesthesia, a guide needle was advanced to the left kidney lower pole cortex. Needle  position confirmed with ultrasound. Through the access, 2 16 gauge core biopsies obtained of the lower pole. Samples placed in saline. Needles removed. No immediate complication. Patient tolerated the biopsy well. COMPLICATIONS: None. FINDINGS: Imaging confirms needle placement to the left kidney lower pole for renal biopsy IMPRESSION: Successful ultrasound left kidney core biopsy Electronically Signed   By: Jerilynn Mages.  Shick M.D.   On: 11/07/2015 17:13   Ir Fluoro Guide Cv Line Right  11/05/2015  CLINICAL DATA:  Acute renal insufficiency, needs venous access for hemodialysis EXAM: EXAM RIGHT IJ CATHETER PLACEMENT UNDER ULTRASOUND AND FLUOROSCOPIC GUIDANCE TECHNIQUE: The procedure, risks (including but not limited to bleeding, infection, organ damage, pneumothorax), benefits, and alternatives were explained to the patient. Questions regarding the procedure were encouraged and answered. The patient understands and consents to the procedure. Patency of the right IJ vein was confirmed with ultrasound with image documentation. An appropriate skin site was determined. Skin site was marked. Region was prepped using maximum barrier technique including cap and mask, sterile gown, sterile gloves, large sterile sheet, and Chlorhexidine as cutaneous antisepsis. The region was infiltrated locally with 1% lidocaine. Under real-time ultrasound guidance, the right IJ vein was accessed with a 19 gauge needle; the  needle tip within the vein was confirmed with ultrasound image documentation. The needle exchanged over a guidewire for vascular dilator which allowed advancement of a 20 cm Trialysis catheter. This was positioned with the tip at the cavoatrial junction. Spot chest radiograph shows good positioning and no pneumothorax. Catheter was flushed and sutured externally with 0-Prolene sutures. Patient tolerated the procedure well. FLUOROSCOPY TIME:  Less than 6 seconds, 1.9 uGym2 DAP COMPLICATIONS: COMPLICATIONS none IMPRESSION: 1.  Technically successful right IJ Trialysis catheter placement. Electronically Signed   By: Lucrezia Europe M.D.   On: 11/05/2015 16:52   Ir US Guide Vasc Access Right  11/05/2015  CLINICAL DATA:  Acute renal insufficiency, needs venous access for hemodialysis EXAM: EXAM RIGHT IJ CATHETER PLACEMENT UNDER ULTRASOUND AND FLUOROSCOPIC GUIDANCE TECHNIQUE: The procedure, risks (including but not limited to bleeding, infection, organ damage, pneumothorax), benefits, and alternatives were explained to the patient. Questions regarding the procedure were encouraged and answered. The patient understands and consents to the procedure. Patency of the right IJ vein was confirmed with ultrasound with image documentation. An appropriate skin site was determined. Skin site was marked. Region was prepped using maximum barrier technique including cap and mask, sterile gown, sterile gloves, large sterile sheet, and Chlorhexidine as cutaneous antisepsis. The region was infiltrated locally with 1% lidocaine. Under real-time ultrasound guidance, the right IJ vein was accessed with a 19 gauge needle; the needle tip within the vein was confirmed with ultrasound image documentation. The needle exchanged over a guidewire for vascular dilator which allowed advancement of a 20 cm Trialysis catheter. This was positioned with the tip at the cavoatrial junction. Spot chest radiograph shows good positioning and no pneumothorax. Catheter was flushed and sutured externally with 0-Prolene sutures. Patient tolerated the procedure well. FLUOROSCOPY TIME:  Less than 6 seconds, 1.9 uGym2 DAP COMPLICATIONS: COMPLICATIONS none IMPRESSION: 1. Technically successful right IJ Trialysis catheter placement. Electronically Signed   By: Lucrezia Europe M.D.   On: 11/05/2015 16:52    Microbiology: No results found for this or any previous visit (from the past 240 hour(s)).   Labs: Basic Metabolic Panel:  Recent Labs Lab 11/08/15 0540 11/09/15 0435  11/11/15 0945 11/12/15 0422 11/13/15 0737  NA 139 136 136 136 139  K 4.1 3.6 3.1* 3.3* 3.3*  CL 104 101 101 104 108  CO2 25 28 23 22  21*  GLUCOSE 94 103* 118* 101* 95  BUN 47* 30* 48* 54* 54*  CREATININE 8.94* 7.38* 7.36* 6.18* 4.08*  CALCIUM 7.7* 7.5* 8.1* 7.6* 7.8*  PHOS 7.9* 6.7* 8.1* 8.0* 6.8*   Liver Function Tests:  Recent Labs Lab 11/08/15 0540 11/09/15 0435 11/11/15 0945 11/12/15 0422 11/13/15 0737  ALBUMIN 1.5* 1.5* 1.7* 1.4* 1.4*   No results for input(s): LIPASE, AMYLASE in the last 168 hours. No results for input(s): AMMONIA in the last 168 hours. CBC:  Recent Labs Lab 11/07/15 0257 11/08/15 0540 11/09/15 0435 11/11/15 0945 11/13/15 0737  WBC 12.1* 11.6* 12.0* 10.3 9.4  HGB 11.5* 11.4* 11.5* 12.2 11.5*  HCT 34.1* 34.1* 34.9* 37.6 35.3*  MCV 81.4 82.2 82.9 82.5 82.3  PLT 266 245 209 234 192   Cardiac Enzymes: No results for input(s): CKTOTAL, CKMB, CKMBINDEX, TROPONINI in the last 168 hours. BNP: BNP (last 3 results) No results for input(s): BNP in the last 8760 hours.  ProBNP (last 3 results) No results for input(s): PROBNP in the last 8760 hours.  CBG: No results for input(s): GLUCAP in the last 168 hours.  SignedIrine Seal MD [facility] 11/13/2015, 3:56 PM

## 2015-11-13 NOTE — Progress Notes (Signed)
NURSING PROGRESS NOTE  RICKEA SCHUTZ [id number] Discharge Data: 11/13/2015 6:41 PM Attending Provider: Eugenie Filler, MD [id number] DAVID, MD     Christianne Borrow to be D/C'd Home per MD order.  Discussed with the patient the After Visit Summary and all questions fully answered. All IV's discontinued with no bleeding noted. All belongings returned to patient for patient to take home.   Last Vital Signs:  Blood pressure 136/91, pulse 91, temperature 98 F (36.7 C), temperature source Oral, resp. rate 17, height 5\' 3"  (1.6 m), weight 80.5 kg (177 lb 7.5 oz), SpO2 100 %.  Discharge Medication List   Medication List    STOP taking these medications        azithromycin 250 MG tablet  Commonly known as:  ZITHROMAX     benazepril 40 MG tablet  Commonly known as:  LOTENSIN     hydrochlorothiazide 25 MG tablet  Commonly known as:  HYDRODIURIL      TAKE these medications        acetaminophen 500 MG tablet  Commonly known as:  TYLENOL  Take 500 mg by mouth every 6 (six) hours as needed for mild pain or headache.     atorvastatin 40 MG tablet  Commonly known as:  LIPITOR  Take 1 tablet (40 mg total) by mouth daily.     benzonatate 200 MG capsule  Commonly known as:  TESSALON  Take 1 capsule (200 mg total) by mouth 3 (three) times daily as needed for cough.     NEXIUM PO  Take 1 capsule by mouth daily.     promethazine 25 MG tablet  Commonly known as:  PHENERGAN  Take 1 tablet (25 mg total) by mouth every 6 (six) hours as needed for nausea or vomiting.

## 2015-11-18 NOTE — Patient Outreach (Signed)
[facility]) Care Management  11/18/2015  Lydia Edwards 07/27/1951 [id number]   Patient triggered RED on EMMI Pneumonia dashboard for 11/17/2015, assigned Quinn Plowman, RN to outreach.  Thanks, Lydia Edwards. [place], [place] Assistant Phone: [phone] Fax: [phone]

## 2015-11-19 ENCOUNTER — Encounter (HOSPITAL_COMMUNITY): Payer: Self-pay

## 2015-11-19 ENCOUNTER — Other Ambulatory Visit: Payer: Self-pay

## 2015-11-19 NOTE — Patient Outreach (Addendum)
[facility] [facility]) Care Management  11/19/2015  SHAQUEEN PELLE [birth year] [id number]   SUBJECTIVE; Telephone call to patient regarding EMMI pneumonia referral.  HIPAA verified with patient.  Patient reports she is doing good. Patient states she still has a mild cough and continues to take her prescribed tessalon medication. Patient denies any shortness of breath or fever. Patient states she does have dry mouth.  RNCM advised patient to drink plenty of fluids, keep lips moisturized with a lip moisturizer, and get plenty of rest.  RNCM discussed signs/smptoms of pneumonia with patient and advised patient to contact doctor for these symptoms.  Patient verbalized understanding.  Patient states she has an appointment scheduled with her primary care provider on tomorrow, 11/20/15.  Patient confirms her appointment with Dr. Lorrene Reid, nephrologist for December 10, 2015. RNCM discussed with patient importance of keeping her doctor appointments.   Patient states she is checking her blood pressure as requested by the hospital doctor but has not recorded. RNCM advised patient to record her blood pressure results and take to her doctor appointments.  Patient state she was instructed by the hospital doctor not to take any of her medications except for the cough medicine until she returns to her primary doctor. Patient states she will discuss this with her doctor at tomorrow's appointments.  Patient agreed to next follow up phone outreach from [facility].  Patient states she had her flu shot in October 2016  ASSESSMENT: EMMI pneumonia red referral.  Primary MD: Dr. Melford Aase Nephrologist: Dr. Lorrene Reid  PLAN; RNCM will follow up with patient within 1 week Patient will report to [facility] outcome of primary MD visit on 11/20/15.  Patient will report monitoring and recording blood pressures.  Quinn Plowman RN,BSN,CCM [place] Coordinator [phone]

## 2015-11-20 ENCOUNTER — Encounter: Payer: Self-pay | Admitting: Physician Assistant

## 2015-11-20 ENCOUNTER — Ambulatory Visit (INDEPENDENT_AMBULATORY_CARE_PROVIDER_SITE_OTHER): Payer: Commercial Managed Care - HMO | Admitting: Physician Assistant

## 2015-11-20 VITALS — BP 130/80 | HR 60 | Temp 97.5°F | Resp 14 | Ht 64.0 in | Wt 181.0 lb

## 2015-11-20 DIAGNOSIS — N19 Unspecified kidney failure: Secondary | ICD-10-CM

## 2015-11-20 DIAGNOSIS — Z0000 Encounter for general adult medical examination without abnormal findings: Secondary | ICD-10-CM

## 2015-11-20 DIAGNOSIS — E559 Vitamin D deficiency, unspecified: Secondary | ICD-10-CM

## 2015-11-20 DIAGNOSIS — I10 Essential (primary) hypertension: Secondary | ICD-10-CM

## 2015-11-20 DIAGNOSIS — E785 Hyperlipidemia, unspecified: Secondary | ICD-10-CM

## 2015-11-20 DIAGNOSIS — N040 Nephrotic syndrome with minor glomerular abnormality: Secondary | ICD-10-CM

## 2015-11-20 DIAGNOSIS — N170 Acute kidney failure with tubular necrosis: Secondary | ICD-10-CM

## 2015-11-20 DIAGNOSIS — Z79899 Other long term (current) drug therapy: Secondary | ICD-10-CM

## 2015-11-20 DIAGNOSIS — R300 Dysuria: Secondary | ICD-10-CM

## 2015-11-20 DIAGNOSIS — R7309 Other abnormal glucose: Secondary | ICD-10-CM

## 2015-11-20 LAB — CBC WITH DIFFERENTIAL/PLATELET
BASOS ABS: 0.1 10*3/uL (ref 0.0–0.1)
Basophils Relative: 1 % (ref 0–1)
Eosinophils Absolute: 0.4 10*3/uL (ref 0.0–0.7)
Eosinophils Relative: 4 % (ref 0–5)
HEMATOCRIT: 37.8 % (ref 36.0–46.0)
HEMOGLOBIN: 12.1 g/dL (ref 12.0–15.0)
LYMPHS ABS: 2.4 10*3/uL (ref 0.7–4.0)
LYMPHS PCT: 27 % (ref 12–46)
MCH: 26.7 pg (ref 26.0–34.0)
MCHC: 32 g/dL (ref 30.0–36.0)
MCV: 83.3 fL (ref 78.0–100.0)
MONOS PCT: 9 % (ref 3–12)
MPV: 8.8 fL (ref 8.6–12.4)
Monocytes Absolute: 0.8 10*3/uL (ref 0.1–1.0)
NEUTROS PCT: 59 % (ref 43–77)
Neutro Abs: 5.3 10*3/uL (ref 1.7–7.7)
Platelets: 326 10*3/uL (ref 150–400)
RBC: 4.54 MIL/uL (ref 3.87–5.11)
RDW: 13.6 % (ref 11.5–15.5)
WBC: 8.9 10*3/uL (ref 4.0–10.5)

## 2015-11-20 LAB — HEPATIC FUNCTION PANEL
ALK PHOS: 63 U/L (ref 33–130)
ALT: 12 U/L (ref 6–29)
AST: 15 U/L (ref 10–35)
Albumin: 1.8 g/dL — ABNORMAL LOW (ref 3.6–5.1)
BILIRUBIN TOTAL: 0.3 mg/dL (ref 0.2–1.2)
Total Protein: 4.6 g/dL — ABNORMAL LOW (ref 6.1–8.1)

## 2015-11-20 LAB — BASIC METABOLIC PANEL WITH GFR
BUN: 28 mg/dL — ABNORMAL HIGH (ref 7–25)
CHLORIDE: 109 mmol/L (ref 98–110)
CO2: 22 mmol/L (ref 20–31)
Calcium: 7.6 mg/dL — ABNORMAL LOW (ref 8.6–10.4)
Creat: 1.31 mg/dL — ABNORMAL HIGH (ref 0.50–0.99)
GFR, EST AFRICAN AMERICAN: 49 mL/min — AB (ref >=60)
GFR, EST NON AFRICAN AMERICAN: 43 mL/min — AB (ref >=60)
Glucose, Bld: 85 mg/dL (ref 65–99)
POTASSIUM: 3.4 mmol/L — AB (ref 3.5–5.3)
Sodium: 141 mmol/L (ref 135–146)

## 2015-11-20 LAB — MAGNESIUM: Magnesium: 1.9 mg/dL (ref 1.5–2.5)

## 2015-11-20 LAB — URIC ACID: Uric Acid, Serum: 4.3 mg/dL (ref 2.4–7.0)

## 2015-11-20 MED ORDER — AMLODIPINE BESYLATE 5 MG PO TABS: 5.0000 mg | ORAL_TABLET | Freq: Every day | ORAL | Status: DC

## 2015-11-20 NOTE — Patient Instructions (Signed)
Start on the norvasc 5mg , only 1/2 pill a day, Stop if you get dizzy, have worsening swelling in your legs.  Please do not take it if your blood pressure is below 120/80 Please take a whole pill if your blood pressure is above 140/90  Please elevate your legs above your heart 3 x a day for 20 mins Go to the ER if you have any redness, tenderness, pain in your legs or any shortness of breath or chest pain.

## 2015-11-20 NOTE — Progress Notes (Signed)
Complete Physical  Assessment and Plan: 1. Essential hypertension Want a little stricter control, will start low dose norvasc with guidelines printed out for the patient going into the holiday weekend.  - amLODipine (NORVASC) 5 MG tablet; Take 1 tablet (5 mg total) by mouth daily.  Dispense: 30 tablet; Refill: 11 - CBC with Differential/Platelet - Hepatic function panel  2. Hyperlipidemia -continue medications, check lipids, decrease fatty foods, increase activity.  - Hepatic function panel  3. Morbid obesity, unspecified obesity type (Christine) Obesity with co morbidities- long discussion about weight loss, diet, and exercise - Hepatic function panel  4. Acute renal failure with tubular necrosis (HCC) Will check BMP, hopefully trending up, has follow up with Dr. Lorrene Reid in Jan  Discussed possibility of dialysis Will continue short term disability at this time.  - BASIC METABOLIC PANEL WITH GFR - Uric acid  5. Renal failure Will check BMP, hopefully trending up, has follow up with Dr. Lorrene Reid in Jan  - [place] GFR - Uric acid  6. SYNDROME, NEPHROTIC W/MINIMAL CHANGE LESION Will check BMP, hopefully trending up, has follow up with Dr. Lorrene Reid in Jan  - [place] GFR  7. Vitamin D deficiency  8. Other abnormal glucose  9. Medication management - Magnesium  10. Dysuria Will recheck urine today, no fever, chills - Urinalysis, Routine w reflex microscopic (not at [facility]) - Urine culture  Discussed med's effects and SE's. Screening labs and tests as requested with regular follow-up as recommended. Over 40 minutes of exam, counseling, chart review, and complex, high level critical decision making was performed this visit.   HPI  64 y.o. female  presents for a complete physical and hospital follow up.   She was in admitted to the hospital for ARF/CAP, while in the hospital her Cr continued to rise getting to as high as 10, her ACE/HCTZ and NSAID  was discontinued and she was started on HD 11/05/2015, biopsy showed likely ATN/minimal change. She will have follow up with Dr. Lorrene Reid 12/10/2015.  She was treated for CAP. Today she states she is still feeling tired, has some decreased appetite. Denies SOB, CP, muscle cramps. She has had some dysuria Wt Readings from Last 3 Encounters:  11/20/15 181 lb (82.101 kg)  11/12/15 177 lb 7.5 oz (80.5 kg)  10/27/15 188 lb (85.276 kg)   Her blood pressure has been controlled at home but not at goal, she was taken off her BP medications due to AKI, today their BP is BP: 130/80 mmHg She does not workout. She denies chest pain, shortness of breath, dizziness.  She is on cholesterol medication and denies myalgias. Her cholesterol is not at goal. The cholesterol last visit was:   Lab Results  Component Value Date   CHOL 183 10/06/2015   HDL 44* 10/06/2015   LDLCALC 125 10/06/2015   LDLDIRECT 160.1 07/18/2009   TRIG 68 10/06/2015   CHOLHDL 4.2 10/06/2015   She has been working on diet and exercise for prediabetes, and denies paresthesia of the feet, polydipsia, polyuria and visual disturbances. Last A1C in the office was:  Lab Results  Component Value Date   HGBA1C 6.1* 10/06/2015   Lab Results  Component Value Date   GFRAA 12* 11/13/2015   Patient is on Vitamin D supplement.   Lab Results  Component Value Date   VD25OH 41 06/16/2015      Current Medications:  Current Outpatient Prescriptions on File Prior to Visit  Medication Sig Dispense Refill  .  acetaminophen (TYLENOL) 500 MG tablet Take 500 mg by mouth every 6 (six) hours as needed for mild pain or headache.     Marland Kitchen atorvastatin (LIPITOR) 40 MG tablet Take 1 tablet (40 mg total) by mouth daily. 30 tablet 3  . benzonatate (TESSALON) 200 MG capsule Take 1 capsule (200 mg total) by mouth 3 (three) times daily as needed for cough. 20 capsule 0  . Esomeprazole Magnesium (NEXIUM PO) Take 1 capsule by mouth daily.    . promethazine (PHENERGAN)  25 MG tablet Take 1 tablet (25 mg total) by mouth every 6 (six) hours as needed for nausea or vomiting. 30 tablet 3   No current facility-administered medications on file prior to visit.   Health Maintenance:   Immunization History  Administered Date(s) Administered  . Influenza-Unspecified 08/30/2015  . Pneumococcal Polysaccharide-23 10/29/2015  . Td 02/19/2000   Last colonoscopy: 2016 Last mammogram: 06/2015 Last pap smear/pelvic exam:  DEXA: due  Prior vaccinations: TD or Tdap: Patient declines at this time Influenza: 2016 Pneumococcal: Declines  Prevnar13: Declines Shingles/Zostavax: Declines  Last Dental Exam:free clinic 2 years ago Last Eye Exam: Dr. Ricki Miller Patient Care Team: Unk Pinto, MD as PCP - General (Internal Medicine) Vania Rea, MD as Consulting Physician (Obstetrics and Gynecology) Lafayette Dragon, MD as Consulting Physician (Gastroenterology) Dannielle Karvonen, RN as [place] Management  Allergies:  Allergies  Allergen Reactions  . Naproxen     REACTION: renal failure  . Nsaids    Medical History:  Past Medical History  Diagnosis Date  . Hyperlipidemia   . Hypertension   . Vitamin D deficiency   . Prediabetes    Surgical History:  Past Surgical History  Procedure Laterality Date  . Abdominal hysterectomy     Family History:  Family History  Problem Relation Age of Onset  . Stroke Sister   . Hypertension Sister   . Cancer Sister   . Heart attack Brother   . Colon cancer Neg Hx    Social History:  Social History  Substance Use Topics  . Smoking status: Never Smoker   . Smokeless tobacco: Never Used  . Alcohol Use: No    Review of Systems: Review of Systems  Constitutional: Positive for malaise/fatigue. Negative for fever, chills, weight loss and diaphoresis.  HENT: Negative.   Respiratory: Positive for cough. Negative for hemoptysis, sputum production, shortness of breath and wheezing.    Cardiovascular: Positive for leg swelling. Negative for chest pain, palpitations, orthopnea, claudication and PND.  Gastrointestinal: Negative.        Decreased appetite  Genitourinary: Positive for dysuria. Negative for urgency, frequency, hematuria and flank pain.  Musculoskeletal: Negative.   Skin: Negative.  Negative for rash.  Neurological: Negative.  Negative for dizziness and weakness.  Psychiatric/Behavioral: Negative.     Physical Exam: Estimated body mass index is 31.05 kg/(m^2) as calculated from the following:   Height as of this encounter: 5\' 4"  (1.626 m).   Weight as of this encounter: 181 lb (82.101 kg). BP 130/80 mmHg  Pulse 60  Temp(Src) 97.5 F (36.4 C) (Temporal)  Resp 14  Ht 5\' 4"  (1.626 m)  Wt 181 lb (82.101 kg)  BMI 31.05 kg/m2  SpO2 98% General Appearance: Well nourished, in no apparent distress.  Eyes: PERRLA, EOMs, conjunctiva no swelling or erythema, normal fundi and vessels.  Sinuses: No Frontal/maxillary tenderness  ENT/Mouth: Ext aud canals clear, normal light reflex with TMs without erythema, bulging. Good dentition. No erythema, swelling, or  exudate on post pharynx. Tonsils not swollen or erythematous. Hearing normal.  Neck: Supple, thyroid normal. No bruits  Respiratory: Respiratory effort normal, BS equal bilaterally without rales, rhonchi, wheezing or stridor.  Cardio: RRR without murmurs, rubs or gallops. Brisk peripheral pulses with mild bilateral edema.  Chest: symmetric, with normal excursions and percussion.  Breasts: defer Abdomen: Soft, nontender, no guarding, rebound, hernias, masses, or organomegaly.  Lymphatics: Non tender without lymphadenopathy.  Genitourinary: defer Musculoskeletal: Full ROM all peripheral extremities,5/5 strength, and normal gait.  Skin: Warm, dry without rashes, lesions, ecchymosis. Neuro: Cranial nerves intact, reflexes equal bilaterally. Normal muscle tone, no cerebellar symptoms. Sensation intact.  Psych:  Awake and oriented X 3, normal affect, Insight and Judgment appropriate.   EKG: defer. AORTA SCAN: defer  Vicie Mutters 8:33 AM [facility]

## 2015-11-21 LAB — URINALYSIS, ROUTINE W REFLEX MICROSCOPIC
Bilirubin Urine: NEGATIVE
Ketones, ur: NEGATIVE
LEUKOCYTES UA: NEGATIVE
NITRITE: NEGATIVE
PH: 6.5 (ref 5.0–8.0)
SPECIFIC GRAVITY, URINE: 1.025 (ref 1.001–1.035)

## 2015-11-21 LAB — URINE CULTURE: Colony Count: 50000

## 2015-11-21 LAB — URINALYSIS, MICROSCOPIC ONLY
CASTS: NONE SEEN [LPF]
CRYSTALS: NONE SEEN [HPF]
RBC / HPF: NONE SEEN RBC/HPF (ref <=2)
Yeast: NONE SEEN [HPF]

## 2015-11-25 ENCOUNTER — Telehealth: Payer: Self-pay | Admitting: Physician Assistant

## 2015-11-25 ENCOUNTER — Other Ambulatory Visit: Payer: Self-pay | Admitting: Physician Assistant

## 2015-11-25 NOTE — Telephone Encounter (Signed)
Patient recently started on norvasc for BP control, now complained of bilateral leg swelling. Will get her to stop the norvasc, elevate legs above heart 3 x a day for 20 mins, weight self daily, if weight goes up by more than 5 lbs or if she gets SOB with lying down flat, waking up in the night with SOB, or SOB to come into the office for evaluation. Monitor BP at home, and follow up in the office in 1-2 days.

## 2015-11-25 NOTE — Telephone Encounter (Signed)
Spoke w/ pt about stopping the [facility] & gave pt directions about leg swelling. Pt was transferred to a scheduler for a follow up visit.

## 2015-11-27 ENCOUNTER — Ambulatory Visit (INDEPENDENT_AMBULATORY_CARE_PROVIDER_SITE_OTHER): Payer: Commercial Managed Care - HMO | Admitting: Physician Assistant

## 2015-11-27 ENCOUNTER — Encounter: Payer: Self-pay | Admitting: Physician Assistant

## 2015-11-27 VITALS — BP 140/90 | HR 67 | Temp 97.7°F | Resp 16 | Ht 64.0 in | Wt 191.0 lb

## 2015-11-27 DIAGNOSIS — I10 Essential (primary) hypertension: Secondary | ICD-10-CM | POA: Diagnosis not present

## 2015-11-27 DIAGNOSIS — N170 Acute kidney failure with tubular necrosis: Secondary | ICD-10-CM | POA: Diagnosis not present

## 2015-11-27 DIAGNOSIS — R600 Localized edema: Secondary | ICD-10-CM | POA: Diagnosis not present

## 2015-11-27 LAB — BASIC METABOLIC PANEL WITH GFR
BUN: 14 mg/dL (ref 7–25)
CALCIUM: 7.4 mg/dL — AB (ref 8.6–10.4)
CO2: 26 mmol/L (ref 20–31)
CREATININE: 0.8 mg/dL (ref 0.50–0.99)
Chloride: 108 mmol/L (ref 98–110)
GFR, EST AFRICAN AMERICAN: 89 mL/min (ref >=60)
GFR, EST NON AFRICAN AMERICAN: 78 mL/min (ref >=60)
GLUCOSE: 74 mg/dL (ref 65–99)
Potassium: 3.8 mmol/L (ref 3.5–5.3)
SODIUM: 141 mmol/L (ref 135–146)

## 2015-11-27 MED ORDER — FUROSEMIDE 20 MG PO TABS: 20.0000 mg | ORAL_TABLET | Freq: Every day | ORAL | Status: DC

## 2015-11-27 NOTE — Patient Instructions (Signed)
will start on low dose lasix  elevated legs 3 x a day get compression stockings from medical supply store,  get kidney function today and in 1 week- monitor weight and BP daily - call if any changes, nausea, SOB, weight does not go down.

## 2015-11-27 NOTE — Progress Notes (Signed)
Assessment and Plan: Edema with recent kidney injury- no signs of CHF- will start on low dose lasix, elevated legs 3 x a day, get compression stockings, get kidney function today and in 1 week- close follow up- monitor weight and BP daily- call if any changes, nausea, SOB, weight does not go down.    HPI 64 y.o.female recently released from the hospital for AKI due to minimal change/dehydration  presents for bilateral edema. She was seen on 12/22, added on norvasc for BP since ACE/HCTZ was stopped in the hospital, kidney function had returned back to normal. She called on 12/27 with bilateral edema Since then she states she has bilateral leg edema, her norvasc was stopped at that time and she was instructed to follow up in the office. The swelling is better in the AM, worse at night, with 10 lb weight gain. No orthopnea, no PND, no SOB.     Lab Results  Component Value Date   CREATININE 1.31* 11/20/2015   BUN 28* 11/20/2015   NA 141 11/20/2015   K 3.4* 11/20/2015   CL 109 11/20/2015   CO2 22 11/20/2015    Wt Readings from Last 3 Encounters:  11/27/15 191 lb (86.637 kg)  11/20/15 181 lb (82.101 kg)  11/12/15 177 lb 7.5 oz (80.5 kg)   Blood pressure 140/90, pulse 67, temperature 97.7 F (36.5 C), temperature source Temporal, resp. rate 16, height 5\' 4"  (1.626 m), weight 191 lb (86.637 kg), SpO2 99 %.   Past Medical History  Diagnosis Date  . Hyperlipidemia   . Hypertension   . Vitamin D deficiency   . Prediabetes      Allergies  Allergen Reactions  . Naproxen     REACTION: renal failure  . Nsaids       Current Outpatient Prescriptions on File Prior to Visit  Medication Sig Dispense Refill  . acetaminophen (TYLENOL) 500 MG tablet Take 500 mg by mouth every 6 (six) hours as needed for mild pain or headache.     Marland Kitchen amLODipine (NORVASC) 5 MG tablet Take 1 tablet (5 mg total) by mouth daily. 30 tablet 11  . atorvastatin (LIPITOR) 40 MG tablet Take 1 tablet (40 mg total) by mouth  daily. 30 tablet 3  . benzonatate (TESSALON) 200 MG capsule Take 1 capsule (200 mg total) by mouth 3 (three) times daily as needed for cough. 20 capsule 0  . Esomeprazole Magnesium (NEXIUM PO) Take 1 capsule by mouth daily.    . promethazine (PHENERGAN) 25 MG tablet Take 1 tablet (25 mg total) by mouth every 6 (six) hours as needed for nausea or vomiting. 30 tablet 3   No current facility-administered medications on file prior to visit.    ROS: all negative except above.   Physical Exam: Filed Weights   11/27/15 1508  Weight: 191 lb (86.637 kg)   BP 140/90 mmHg  Pulse 67  Temp(Src) 97.7 F (36.5 C) (Temporal)  Resp 16  Ht 5\' 4"  (1.626 m)  Wt 191 lb (86.637 kg)  BMI 32.77 kg/m2  SpO2 99% General Appearance: Well nourished, in no apparent distress. Eyes: PERRLA, EOMs, conjunctiva no swelling or erythema Sinuses: No Frontal/maxillary tenderness ENT/Mouth: Ext aud canals clear, TMs without erythema, bulging. No erythema, swelling, or exudate on post pharynx.  Tonsils not swollen or erythematous. Hearing normal.  Neck: Supple, thyroid normal, NO JVD.  Respiratory: Respiratory effort normal, CTAB, without rales, rhonchi, wheezing or stridor.  Cardio: RRR with no MRGs. Brisk peripheral pulses with 3+  edema. Abdomen: Soft, + BS.  Non tender, no guarding, rebound, hernias, masses. Lymphatics: Non tender without lymphadenopathy.  Musculoskeletal: Full ROM, 5/5 strength, normal gait.  Skin: Warm, dry without rashes, lesions, ecchymosis.  Neuro: Cranial nerves intact. Normal muscle tone, no cerebellar symptoms.  Psych: Awake and oriented X 3, normal affect, Insight and Judgment appropriate.     Vicie Mutters, PA-C 3:14 PM [facility]

## 2015-11-28 ENCOUNTER — Other Ambulatory Visit: Payer: Self-pay

## 2015-11-28 NOTE — Patient Outreach (Signed)
[place]) Care Management  [place]  11/28/2015   Lydia Edwards 1950-12-05 [id number]  Subjective: Telephone call to patient regarding pneumonia follow up. HIPAA verified with patient. Patient states she is doing well.  Patient denies any shortness of breath or cough. Patient states she had swelling this week in her feet and ankles and weight gain. Patient states she called her primary MD on Tuesday 11/25/2015 and her primary MD discontinued her amlodipine. Patient states she had a follow up visit at her doctor office on yesterday 11/27/15.  Patient states her doctor changed the dosage on her lasix to 20mg  one time per day. Patient states she started the medication on today. Patient reports she still has some swelling in legs today but not has bad as yesterday. Patient reports she is elevating her legs as much as she can and at night. Patient states her doctor has also recommended that she get compression stockings.  Patient states she is continually working on adhering to a low sodium diet.  Patient states she is monitoring her blood pressure and weights daily and recording. Patient reports she took her recorded weights and blood pressures to her doctors visit on yesterday. Patient states she has a follow up visit with her primary MD on 12/02/15 for blood work.  RNCM advised patient to continue to take her medications as prescribed by her doctor. RNCM advised patient to continued to adhere to low sodium diet and monitor blood pressure and weights daily and record. RNCM informed patient that she would send her additional information regarding low sodium diet and information on stroke symptoms. RNCM reviewed signs/symptoms of pneumonia with patient. Patient states she is aware of when to contact her doctor for symptoms and aware of when to contact 911 for symptoms.  Patient requested Advance directive.  RNCM will send patient advance directive. RNCM informed patient that if  assistance is needed with Advance directive RNCM could refer her to a [facility] care management social worker.  Patient verbalized understanding.  Patient agreed to next telephone outreach with [facility] . RNCM notified patient that she would send her consent form for follow up.  RNCM requested patient sign and copy return to [facility] care management.  Patient verbalized understanding and agreement.    Objective: SEE ASSESSMENT  Current Medications:  Current Outpatient Prescriptions  Medication Sig Dispense Refill  . acetaminophen (TYLENOL) 500 MG tablet Take 500 mg by mouth every 6 (six) hours as needed for mild pain or headache.     Marland Kitchen atorvastatin (LIPITOR) 40 MG tablet Take 1 tablet (40 mg total) by mouth daily. 30 tablet 3  . benzonatate (TESSALON) 200 MG capsule Take 1 capsule (200 mg total) by mouth 3 (three) times daily as needed for cough. 20 capsule 0  . Esomeprazole Magnesium (NEXIUM PO) Take 1 capsule by mouth daily.    . furosemide (LASIX) 20 MG tablet Take 1 tablet (20 mg total) by mouth daily. 30 tablet 11  . promethazine (PHENERGAN) 25 MG tablet Take 1 tablet (25 mg total) by mouth every 6 (six) hours as needed for nausea or vomiting. 30 tablet 3  . amLODipine (NORVASC) 5 MG tablet Take 1 tablet (5 mg total) by mouth daily. (Patient not taking: Reported on 11/28/2015) 30 tablet 11   No current facility-administered medications for this visit.    Functional Status:  In your present state of health, do you have any difficulty performing the following activities: 11/28/2015 10/28/2015  Hearing? N N  Vision?  N N  Difficulty concentrating or making decisions? N N  Walking or climbing stairs? N N  Dressing or bathing? N N  Doing errands, shopping? N N  Preparing Food and eating ? N -  Using the Toilet? N -  In the past six months, have you accidently leaked urine? N -  Do you have problems with loss of bowel control? N -  Managing your Medications? N -  Managing your Finances? Y -   Housekeeping or managing your Housekeeping? N -    Fall/Depression Screening: PHQ 2/9 Scores 11/28/2015 10/06/2015 06/16/2015  PHQ - 2 Score 0 0 0   [place]   Fall Risk  11/28/2015 10/06/2015 06/16/2015  Falls in the past year? No No No   Assessment: EMMI pneumonia red referral transitioning to telephonic nurse case management follow up.   Plan:  RNCM will follow up with patient within 1 week. RNCM will send patient low sodium diet information and stroke information. RNCM will notify patients primary MD of [facility] care management follow up by involvement letter. RNCM will send patients primary MD copy of completed assessment.  RNCM will send patient [facility] care management packet/ consent/ advance directive.

## 2015-11-30 HISTORY — PX: EYE SURGERY: SHX253

## 2015-12-02 ENCOUNTER — Other Ambulatory Visit: Payer: PPO

## 2015-12-02 DIAGNOSIS — N170 Acute kidney failure with tubular necrosis: Secondary | ICD-10-CM | POA: Diagnosis not present

## 2015-12-02 LAB — BASIC METABOLIC PANEL WITH GFR
BUN: 10 mg/dL (ref 7–25)
CO2: 32 mmol/L — ABNORMAL HIGH (ref 20–31)
CREATININE: 0.7 mg/dL (ref 0.50–0.99)
Calcium: 7.9 mg/dL — ABNORMAL LOW (ref 8.6–10.4)
Chloride: 105 mmol/L (ref 98–110)
GFR, Est Non African American: >89 mL/min (ref >=60)
Glucose, Bld: 109 mg/dL — ABNORMAL HIGH (ref 65–99)
POTASSIUM: 3.2 mmol/L — AB (ref 3.5–5.3)
SODIUM: 144 mmol/L (ref 135–146)

## 2015-12-03 ENCOUNTER — Other Ambulatory Visit: Payer: Self-pay | Admitting: Physician Assistant

## 2015-12-03 MED ORDER — POTASSIUM CHLORIDE ER 20 MEQ PO TBCR: EXTENDED_RELEASE_TABLET | ORAL | Status: DC

## 2015-12-04 ENCOUNTER — Other Ambulatory Visit: Payer: Self-pay

## 2015-12-04 NOTE — Patient Outreach (Addendum)
[place] [facility]) Care Management  12/04/2015  Lydia Edwards 1951-05-10 [id number]  SUBJECTIVE;  Telephone call to patient for follow up.  HIPAA verified with patient. Patient states she is feeling pretty good. Patient states, " I got some good news. My kidney's have gone back to normal."  Patient states she followed up with her primary MD. Patient states she was told to eat a little more greens. Patient reports her blood pressure today at 137/82 and her weight is 181.  Patient states she checks her blood pressure and weights and records daily.  Patient states she had no medication changes or treatment changes from last doctors visit.  Patient states she has a follow visit with her primary doctor again on next week, 12/10/15 and an appointment with her kidney doctor on 12/10/15.   Patient denies any shortness of breath.  States she has some swelling in her legs but not as bad.  Patient states the swelling in her legs has gone down this week. Patient states she is wearing her compression stockings.  Patient states she is walking more and she is getting stronger.  Patient states her appetite is better.  Patient in agreement to next follow up call with RNCM.  RNCM advised patient to continue to check blood pressures and weigh daily and record. RNCM advised patient if weight increase 3lbs overnight or 5lbs in a week contact and report to doctor.  RNCM advised patient to continue to take her medications as prescribed.   ASSESSMENT: Follow up hypertension.  Patient continues to keep doctor appointments and adhere to treatment plan.  PLAN: RNCM will follow up with patient within 2 weeks. Patient will report outcome of doctor visits from 12/10/15 Patient will report continued monitoring of blood pressure and weights.  Quinn Plowman RN,BSN,CCM [place] Coordinator [phone]

## 2015-12-09 ENCOUNTER — Other Ambulatory Visit: Payer: Self-pay

## 2015-12-10 ENCOUNTER — Ambulatory Visit (INDEPENDENT_AMBULATORY_CARE_PROVIDER_SITE_OTHER): Payer: PPO | Admitting: *Deleted

## 2015-12-10 DIAGNOSIS — R899 Unspecified abnormal finding in specimens from other organs, systems and tissues: Secondary | ICD-10-CM

## 2015-12-10 DIAGNOSIS — N289 Disorder of kidney and ureter, unspecified: Secondary | ICD-10-CM | POA: Diagnosis not present

## 2015-12-10 DIAGNOSIS — Z79899 Other long term (current) drug therapy: Secondary | ICD-10-CM

## 2015-12-10 DIAGNOSIS — N179 Acute kidney failure, unspecified: Secondary | ICD-10-CM | POA: Diagnosis not present

## 2015-12-10 LAB — BASIC METABOLIC PANEL WITH GFR
BUN: 7 mg/dL (ref 7–25)
CO2: 28 mmol/L (ref 20–31)
Calcium: 8.7 mg/dL (ref 8.6–10.4)
Chloride: 107 mmol/L (ref 98–110)
Creat: 0.82 mg/dL (ref 0.50–0.99)
GFR, EST AFRICAN AMERICAN: 87 mL/min (ref >=60)
GFR, Est Non African American: 75 mL/min (ref >=60)
GLUCOSE: 87 mg/dL (ref 65–99)
POTASSIUM: 4.1 mmol/L (ref 3.5–5.3)
Sodium: 142 mmol/L (ref 135–146)

## 2015-12-10 LAB — MAGNESIUM: Magnesium: 1.6 mg/dL (ref 1.5–2.5)

## 2015-12-10 NOTE — Progress Notes (Signed)
Patient ID: Lydia Edwards, female   DOB: Jul 16, 1951, 65 y.o.   MRN: [id number] Patient RTC today for recheck STAT BMP and Magnesium labs per Vicie Mutters, PA-C.  Patient currently taking 20 meq Potassium AD.

## 2015-12-18 ENCOUNTER — Other Ambulatory Visit: Payer: Self-pay

## 2015-12-18 NOTE — Patient Outreach (Signed)
[place] [facility]) Care Management  12/18/2015  Lydia Edwards 1951-04-16 [id number]   SUBJECTIVE:  Telephone call to patient for follow up.  HIPAA verified by patient. Patient reports she is doing very well. Patient states she followed up with her primary MD and nephrologist since last outreach with RNCM.  Patient states her primary MD says she is doing well.  Patient states no change was made with her treatment.  Patient states she had follow up with her nephrologist who told her she does not have to return unless she is having problems. Patient states she still has slight puffiness in her left ankle but states her doctor said it should resolve. Patient denies having any additional swelling or shortness of breath. Patient states she continues to wear her TED hose daily. Patient states she is walking daily. Patient states she walks to her mail box 2 times and walks up and down the stairs twice.  Patient reports she continues to adhere to her low sodium diet.  RNCM advised patient to continue to take her medications as prescribed, continue to keep her follow up doctor visits and continue to monitor and record blood pressure and weights. RNCM discussed with patient low sodium food options.   RNCM reviewed signs/symptoms of stroke with patient. Advised patient to contact 911 for stroke like symptoms.  Patient verbalized understanding. Patient agreed to next telephone outreach with [facility].  ASSESSMENT: Patient continues to adhere to her doctors treatment plan.   PLAN; RNCM will follow up with patient within 2 weeks. Patient will be able to state 3 signs and symptoms of stroke.    Quinn Plowman RN,BSN,CCM [facility] Telephonic  [phone]

## 2015-12-31 ENCOUNTER — Encounter: Payer: Self-pay | Admitting: Internal Medicine

## 2016-01-01 ENCOUNTER — Other Ambulatory Visit: Payer: Self-pay

## 2016-01-01 NOTE — Patient Outreach (Addendum)
[place] [facility]) Care Management  01/01/2016  Lydia Edwards 1951/04/27 [id number]   SUBJECTIVE:  Telephone call to patient for case closure. HIPAA verified with patient. Patient states she is doing very well.  Patient states she started driving last week. Patient states she has gone to the beauty salon twice and grocery store. Patient states she does not have anymore swelling in her legs, ankle, or feet. Patient states she has a follow up visit with Dr. Melford Aase on 01/07/16. Patient states she hopes that he will clear her to return to work.  Patient states 3 signs and symptoms of stroke:  Blurred vision, slurred speech, and numbness in one side of the body.  Patient reports she continues to take her medications as prescribed. Patient denies having any additional needs or concerns. Patient verbalized agreement and understanding of closure to [facility] care management services. Patient verbalized appreciation to [facility].    ASSESSMENT: Patient is self managing care. Patient has met goals with [facility] care management.  PLAN; RNCM will refer patient to Elmyra Ricks to close due to goals being met.  RNCM will notify patients primary MD of closure due to goals being met.  RNCM will send patient [facility] care management survey  Quinn Plowman RN,BSN,CCM [facility] Telephonic  [phone]

## 2016-01-07 ENCOUNTER — Encounter: Payer: Self-pay | Admitting: Internal Medicine

## 2016-01-07 ENCOUNTER — Ambulatory Visit (INDEPENDENT_AMBULATORY_CARE_PROVIDER_SITE_OTHER): Payer: PPO | Admitting: Internal Medicine

## 2016-01-07 VITALS — BP 132/76 | HR 64 | Temp 97.5°F | Resp 16 | Ht 64.5 in | Wt 182.0 lb

## 2016-01-07 DIAGNOSIS — R7309 Other abnormal glucose: Secondary | ICD-10-CM | POA: Diagnosis not present

## 2016-01-07 DIAGNOSIS — E785 Hyperlipidemia, unspecified: Secondary | ICD-10-CM

## 2016-01-07 DIAGNOSIS — E559 Vitamin D deficiency, unspecified: Secondary | ICD-10-CM | POA: Diagnosis not present

## 2016-01-07 DIAGNOSIS — Z1389 Encounter for screening for other disorder: Secondary | ICD-10-CM | POA: Diagnosis not present

## 2016-01-07 DIAGNOSIS — Z79899 Other long term (current) drug therapy: Secondary | ICD-10-CM | POA: Diagnosis not present

## 2016-01-07 DIAGNOSIS — I10 Essential (primary) hypertension: Secondary | ICD-10-CM | POA: Diagnosis not present

## 2016-01-07 DIAGNOSIS — Z1331 Encounter for screening for depression: Secondary | ICD-10-CM

## 2016-01-07 DIAGNOSIS — Z1212 Encounter for screening for malignant neoplasm of rectum: Secondary | ICD-10-CM

## 2016-01-07 LAB — CBC WITH DIFFERENTIAL/PLATELET
BASOS PCT: 1 % (ref 0–1)
Basophils Absolute: 0.1 10*3/uL (ref 0.0–0.1)
Eosinophils Absolute: 0.2 10*3/uL (ref 0.0–0.7)
Eosinophils Relative: 2 % (ref 0–5)
HEMATOCRIT: 39.7 % (ref 36.0–46.0)
HEMOGLOBIN: 12.6 g/dL (ref 12.0–15.0)
LYMPHS PCT: 39 % (ref 12–46)
Lymphs Abs: 3 10*3/uL (ref 0.7–4.0)
MCH: 26.9 pg (ref 26.0–34.0)
MCHC: 31.7 g/dL (ref 30.0–36.0)
MCV: 84.6 fL (ref 78.0–100.0)
MONO ABS: 0.8 10*3/uL (ref 0.1–1.0)
MONOS PCT: 10 % (ref 3–12)
MPV: 9.1 fL (ref 8.6–12.4)
NEUTROS ABS: 3.6 10*3/uL (ref 1.7–7.7)
Neutrophils Relative %: 48 % (ref 43–77)
Platelets: 254 10*3/uL (ref 150–400)
RBC: 4.69 MIL/uL (ref 3.87–5.11)
RDW: 14.7 % (ref 11.5–15.5)
WBC: 7.6 10*3/uL (ref 4.0–10.5)

## 2016-01-07 NOTE — Patient Instructions (Signed)
Recommend Adult Low Dose Aspirin or   coated  Aspirin 81 mg daily   To reduce risk of Colon Cancer 20 %,   Skin Cancer 26 % ,   Melanoma 46%   and   Pancreatic cancer 60%   ++++++++++++++++++++++++++++++++++++++++++++++++++++++ Vitamin D goal   is between 70-100.   Please make sure that you are taking your Vitamin D as directed.   It is very important as a natural anti-inflammatory   helping hair, skin, and nails, as well as reducing stroke and heart attack risk.   It helps your bones and helps with mood.  It also decreases numerous cancer risks so please take it as directed.   Low Vit D is associated with a 200-300% higher risk for CANCER   and 200-300% higher risk for HEART   ATTACK  &  STROKE.   .....................................Lydia Edwards  It is also associated with higher death rate at younger ages,   autoimmune diseases like Rheumatoid arthritis, Lupus, Multiple Sclerosis.     Also many other serious conditions, like depression, Alzheimer's  Dementia, infertility, muscle aches, fatigue, fibromyalgia - just to name a few.  ++++++++++++++++++++++++++++++++++++++++++++++++  Recommend the book "The END of DIETING" by Dr Excell Seltzer   & the book "The END of DIABETES " by Dr Excell Seltzer  At [email or web address] - get book & Audio CD's     Being diabetic has a  300% increased risk for heart attack, stroke, cancer, and alzheimer- type vascular dementia. It is very important that you work harder with diet by avoiding all foods that are white. Avoid white rice (brown & wild rice is OK), white potatoes (sweetpotatoes in moderation is OK), White bread or wheat bread or anything made out of white flour like bagels, donuts, rolls, buns, biscuits, cakes, pastries, cookies, pizza crust, and pasta (made from white flour & egg whites) - vegetarian pasta or spinach or wheat pasta is OK. Multigrain breads like Arnold's or Pepperidge [employer], or multigrain sandwich thins or flatbreads.  Diet,  exercise and weight loss can reverse and cure diabetes in the early stages.  Diet, exercise and weight loss is very important in the control and prevention of complications of diabetes which affects every system in your body, ie. Brain - dementia/stroke, eyes - glaucoma/blindness, heart - heart attack/heart failure, kidneys - dialysis, stomach - gastric paralysis, intestines - malabsorption, nerves - severe painful neuritis, circulation - gangrene & loss of a leg(s), and finally cancer and Alzheimers.    I recommend avoid fried & greasy foods,  sweets/candy, white rice (brown or wild rice or Quinoa is OK), white potatoes (sweet potatoes are OK) - anything made from white flour - bagels, doughnuts, rolls, buns, biscuits,white and wheat breads, pizza crust and traditional pasta made of white flour & egg white(vegetarian pasta or spinach or wheat pasta is OK).  Multi-grain bread is OK - like multi-grain flat bread or sandwich thins. Avoid alcohol in excess. Exercise is also important.    Eat all the vegetables you want - avoid meat, especially red meat and dairy - especially cheese.  Cheese is the most concentrated form of trans-fats which is the worst thing to clog up our arteries. Veggie cheese is OK which can be found in the fresh produce section at [employer] or [employer] or [employer]  ++++++++++++++++++++++++++++++++++++++++++++++++++ DASH Eating Plan  DASH stands for "Dietary Approaches to Stop Hypertension."   The DASH eating plan is a healthy eating plan that has been shown to reduce high blood  pressure (hypertension). Additional health benefits may include reducing the risk of type 2 diabetes mellitus, heart disease, and stroke. The DASH eating plan may also help with weight loss.  WHAT DO I NEED TO KNOW ABOUT THE DASH EATING PLAN?  For the DASH eating plan, you will follow these general guidelines:  Choose foods with a percent daily value for sodium of less than 5% (as listed on the food  label).  Use salt-free seasonings or herbs instead of table salt or sea salt.  Check with your health care provider or pharmacist before using salt substitutes.  Eat lower-sodium products, often labeled as "lower sodium" or "no salt added."  Eat fresh foods.  Eat more vegetables, fruits, and low-fat dairy products.    Choose whole grains. Look for the word "whole" as the first word in the ingredient list.  Choose fish   Limit sweets, desserts, sugars, and sugary drinks.  Choose heart-healthy fats.  Eat veggie cheese   Eat more home-cooked food and less restaurant, buffet, and fast food.  Limit fried foods.  [place]uffaker foods using methods other than frying.  Limit canned vegetables. If you do use them, rinse them well to decrease the sodium.  When eating at a restaurant, ask that your food be prepared with less salt, or no salt if possible.                      WHAT FOODS CAN I EAT?  Read Dr Fara Olden Fuhrman's books on The End of Dieting & The End of Diabetes  Grains  Whole grain or whole wheat bread. Brown rice. Whole grain or whole wheat pasta. Quinoa, bulgur, and whole grain cereals. Low-sodium cereals. Corn or whole wheat flour tortillas. Whole grain cornbread. Whole grain crackers. Low-sodium crackers.  Vegetables  Fresh or frozen vegetables (raw, steamed, roasted, or grilled). Low-sodium or reduced-sodium tomato and vegetable juices. Low-sodium or reduced-sodium tomato sauce and paste. Low-sodium or reduced-sodium canned vegetables.   Fruits  All fresh, canned (in natural juice), or frozen fruits.  Protein Products   All fish and seafood.  Dried beans, peas, or lentils. Unsalted nuts and seeds. Unsalted canned beans.  Dairy  Low-fat dairy products, such as skim or 1% milk, 2% or reduced-fat cheeses, low-fat ricotta or cottage cheese, or plain low-fat yogurt. Low-sodium or reduced-sodium cheeses.  Fats and Oils  Tub margarines without trans fats. Light or  reduced-fat mayonnaise and salad dressings (reduced sodium). Avocado. Safflower, olive, or canola oils. Natural peanut or almond butter.  Other  Unsalted popcorn and pretzels. The items listed above may not be a complete list of recommended foods or beverages. Contact your dietitian for more options.  +++++++++++++++++++++++++++++++++++++++++++  WHAT FOODS ARE NOT RECOMMENDED?  Grains/ White flour or wheat flour  White bread. White pasta. White rice. Refined cornbread. Bagels and croissants. Crackers that contain trans fat.  Vegetables  Creamed or fried vegetables. Vegetables in a . Regular canned vegetables. Regular canned tomato sauce and paste. Regular tomato and vegetable juices.  Fruits  Dried fruits. Canned fruit in light or heavy syrup. Fruit juice.  Meat and Other Protein Products  Meat in general - RED mwaet & White meat.  Fatty cuts of meat. Ribs, chicken wings, bacon, sausage, bologna, salami, chitterlings, fatback, hot dogs, bratwurst, and packaged luncheon meats.  Dairy  Whole or 2% milk, cream, half-and-half, and cream cheese. Whole-fat or sweetened yogurt. Full-fat cheeses or blue cheese. Nondairy creamers and whipped toppings. Processed cheese, cheese spreads, or  cheese curds.  Condiments  Onion and garlic salt, seasoned salt, table salt, and sea salt. Canned and packaged gravies. Worcestershire sauce. Tartar sauce. Barbecue sauce. Teriyaki sauce. Soy sauce, including reduced sodium. Steak sauce. Fish sauce. Oyster sauce. Cocktail sauce. Horseradish. Ketchup and mustard. Meat flavorings and tenderizers. Bouillon cubes. Hot sauce. Tabasco sauce. Marinades. Taco seasonings. Relishes.  Fats and Oils Butter, stick margarine, lard, shortening and bacon fat. Coconut, palm kernel, or palm oils. Regular salad dressings.  Pickles and olives. Salted popcorn and pretzels.  The items listed above may not be a complete list of foods and beverages to avoid.   Preventive  Care for Adults  A healthy lifestyle and preventive care can promote health and wellness. Preventive health guidelines for women include the following key practices.  A routine yearly physical is a good way to check with your health care provider about your health and preventive screening. It is a chance to share any concerns and updates on your health and to receive a thorough exam.  Visit your dentist for a routine exam and preventive care every 6 months. Brush your teeth twice a day and floss once a day. Good oral hygiene prevents tooth decay and gum disease.  The frequency of eye exams is based on your age, health, family medical history, use of contact lenses, and other factors. Follow your health care provider's recommendations for frequency of eye exams.  Eat a healthy diet. Foods like vegetables, fruits, whole grains, low-fat dairy products, and lean protein foods contain the nutrients you need without too many calories. Decrease your intake of foods high in solid fats, added sugars, and salt. Eat the right amount of calories for you.Get information about a proper diet from your health care provider, if necessary.  Regular physical exercise is one of the most important things you can do for your health. Most adults should get at least 150 minutes of moderate-intensity exercise (any activity that increases your heart rate and causes you to sweat) each week. In addition, most adults need muscle-strengthening exercises on 2 or more days a week.  Maintain a healthy weight. The body mass index (BMI) is a screening tool to identify possible weight problems. It provides an estimate of body fat based on height and weight. Your health care provider can find your BMI and can help you achieve or maintain a healthy weight.For adults 20 years and older:  A BMI below 18.5 is considered underweight.  A BMI of 18.5 to 24.9 is normal.  A BMI of 25 to 29.9 is considered overweight.  A BMI of 30 and  above is considered obese.  Maintain normal blood lipids and cholesterol levels by exercising and minimizing your intake of saturated fat. Eat a balanced diet with plenty of fruit and vegetables. If your lipid or cholesterol levels are high, you are over 50, or you are at high risk for heart disease, you may need your cholesterol levels checked more frequently.Ongoing high lipid and cholesterol levels should be treated with medicines if diet and exercise are not working.  If you smoke, find out from your health care provider how to quit. If you do not use tobacco, do not start.  Lung cancer screening is recommended for adults aged 56-80 years who are at high risk for developing lung cancer because of a history of smoking. A yearly low-dose CT scan of the lungs is recommended for people who have at least a 30-pack-year history of smoking and are a current smoker or  have quit within the past 15 years. A pack year of smoking is smoking an average of 1 pack of cigarettes a day for 1 year (for example: 1 pack a day for 30 years or 2 packs a day for 15 years). Yearly screening should continue until the smoker has stopped smoking for at least 15 years. Yearly screening should be stopped for people who develop a health problem that would prevent them from having lung cancer treatment.  Avoid use of street drugs. Do not share needles with anyone. Ask for help if you need support or instructions about stopping the use of drugs.  High blood pressure causes heart disease and increases the risk of stroke.  Ongoing high blood pressure should be treated with medicines if weight loss and exercise do not work.  If you are 52-67 years old, ask your health care provider if you should take aspirin to prevent strokes.  Diabetes screening involves taking a blood sample to check your fasting blood sugar level. This should be done once every 3 years, after age 9, if you are within normal weight and without risk factors for  diabetes. Testing should be considered at a younger age or be carried out more frequently if you are overweight and have at least 1 risk factor for diabetes.  Breast cancer screening is essential preventive care for women. You should practice "breast self-awareness." This means understanding the normal appearance and feel of your breasts and may include breast self-examination. Any changes detected, no matter how small, should be reported to a health care provider. Women in their 45s and 30s should have a clinical breast exam (CBE) by a health care provider as part of a regular health exam every 1 to 3 years. After age 76, women should have a CBE every year. Starting at age 54, women should consider having a mammogram (breast X-ray test) every year. Women who have a family history of breast cancer should talk to their health care provider about genetic screening. Women at a high risk of breast cancer should talk to their health care providers about having an MRI and a mammogram every year.  Breast cancer gene (BRCA)-related cancer risk assessment is recommended for women who have family members with BRCA-related cancers. BRCA-related cancers include breast, ovarian, tubal, and peritoneal cancers. Having family members with these cancers may be associated with an increased risk for harmful changes (mutations) in the breast cancer genes BRCA1 and BRCA2. Results of the assessment will determine the need for genetic counseling and BRCA1 and BRCA2 testing.  Routine pelvic exams to screen for cancer are no longer recommended for nonpregnant women who are considered low risk for cancer of the pelvic organs (ovaries, uterus, and vagina) and who do not have symptoms. Ask your health care provider if a screening pelvic exam is right for you.  If you have had past treatment for cervical cancer or a condition that could lead to cancer, you need Pap tests and screening for cancer for at least 20 years after your  treatment. If Pap tests have been discontinued, your risk factors (such as having a new sexual partner) need to be reassessed to determine if screening should be resumed. Some women have medical problems that increase the chance of getting cervical cancer. In these cases, your health care provider may recommend more frequent screening and Pap tests.    Colorectal cancer can be detected and often prevented. Most routine colorectal cancer screening begins at the age of 73 years and  continues through age 75 years. However, your health care provider may recommend screening at an earlier age if you have risk factors for colon cancer. On a yearly basis, your health care provider may provide home test kits to check for hidden blood in the stool. Use of a small camera at the end of a tube, to directly examine the colon (sigmoidoscopy or colonoscopy), can detect the earliest forms of colorectal cancer. Talk to your health care provider about this at age 50, when routine screening begins. Direct exam of the colon should be repeated every 5-10 years through age 75 years, unless early forms of pre-cancerous polyps or small growths are found.  Osteoporosis is a disease in which the bones lose minerals and strength with aging. This can result in serious bone fractures or breaks. The risk of osteoporosis can be identified using a bone density scan. Women ages 65 years and over and women at risk for fractures or osteoporosis should discuss screening with their health care providers. Ask your health care provider whether you should take a calcium supplement or vitamin D to reduce the rate of osteoporosis.  Menopause can be associated with physical symptoms and risks. Hormone replacement therapy is available to decrease symptoms and risks. You should talk to your health care provider about whether hormone replacement therapy is right for you.  Use sunscreen. Apply sunscreen liberally and repeatedly throughout the day. You  should seek shade when your shadow is shorter than you. Protect yourself by wearing long sleeves, pants, a wide-brimmed hat, and sunglasses year round, whenever you are outdoors.  Once a month, do a whole body skin exam, using a mirror to look at the skin on your back. Tell your health care provider of new moles, moles that have irregular borders, moles that are larger than a pencil eraser, or moles that have changed in shape or color.  Stay current with required vaccines (immunizations).  Influenza vaccine. All adults should be immunized every year.  Tetanus, diphtheria, and acellular pertussis (Td, Tdap) vaccine. Pregnant women should receive 1 dose of Tdap vaccine during each pregnancy. The dose should be obtained regardless of the length of time since the last dose. Immunization is preferred during the 27th-36th week of gestation. An adult who has not previously received Tdap or who does not know her vaccine status should receive 1 dose of Tdap. This initial dose should be followed by tetanus and diphtheria toxoids (Td) booster doses every 10 years. Adults with an unknown or incomplete history of completing a 3-dose immunization series with Td-containing vaccines should begin or complete a primary immunization series including a Tdap dose. Adults should receive a Td booster every 10 years.    Zoster vaccine. One dose is recommended for adults aged 60 years or older unless certain conditions are present.    Pneumococcal 13-valent conjugate (PCV13) vaccine. When indicated, a person who is uncertain of her immunization history and has no record of immunization should receive the PCV13 vaccine. An adult aged 19 years or older who has certain medical conditions and has not been previously immunized should receive 1 dose of PCV13 vaccine. This PCV13 should be followed with a dose of pneumococcal polysaccharide (PPSV23) vaccine. The PPSV23 vaccine dose should be obtained at least 8 weeks after the dose  of PCV13 vaccine. An adult aged 19 years or older who has certain medical conditions and previously received 1 or more doses of PPSV23 vaccine should receive 1 dose of PCV13. The PCV13 vaccine dose should   be obtained 1 or more years after the last PPSV23 vaccine dose.    Pneumococcal polysaccharide (PPSV23) vaccine. When PCV13 is also indicated, PCV13 should be obtained first. All adults aged 65 years and older should be immunized. An adult younger than age 65 years who has certain medical conditions should be immunized. Any person who resides in a nursing home or long-term care facility should be immunized. An adult smoker should be immunized. People with an immunocompromised condition and certain other conditions should receive both PCV13 and PPSV23 vaccines. People with human immunodeficiency virus (HIV) infection should be immunized as soon as possible after diagnosis. Immunization during chemotherapy or radiation therapy should be avoided. Routine use of PPSV23 vaccine is not recommended for American Indians, [place] Natives, or people younger than 65 years unless there are medical conditions that require PPSV23 vaccine. When indicated, people who have unknown immunization and have no record of immunization should receive PPSV23 vaccine. One-time revaccination 5 years after the first dose of PPSV23 is recommended for people aged 19-64 years who have chronic kidney failure, nephrotic syndrome, asplenia, or immunocompromised conditions. People who received 1-2 doses of PPSV23 before age 65 years should receive another dose of PPSV23 vaccine at age 65 years or later if at least 5 years have passed since the previous dose. Doses of PPSV23 are not needed for people immunized with PPSV23 at or after age 65 years.   Preventive Services / Frequency  Ages 65 years and over  Blood pressure check.  Lipid and cholesterol check.  Lung cancer screening. / Every year if you are aged 55-80 years and have a  30-pack-year history of smoking and currently smoke or have quit within the past 15 years. Yearly screening is stopped once you have quit smoking for at least 15 years or develop a health problem that would prevent you from having lung cancer treatment.  Clinical breast exam.** / Every year after age 40 years.  BRCA-related cancer risk assessment.** / For women who have family members with a BRCA-related cancer (breast, ovarian, tubal, or peritoneal cancers).  Mammogram.** / Every year beginning at age 40 years and continuing for as long as you are in good health. Consult with your health care provider.  Pap test.** / Every 3 years starting at age 30 years through age 65 or 70 years with 3 consecutive normal Pap tests. Testing can be stopped between 65 and 70 years with 3 consecutive normal Pap tests and no abnormal Pap or HPV tests in the past 10 years.  Fecal occult blood test (FOBT) of stool. / Every year beginning at age 50 years and continuing until age 75 years. You may not need to do this test if you get a colonoscopy every 10 years.  Flexible sigmoidoscopy or colonoscopy.** / Every 5 years for a flexible sigmoidoscopy or every 10 years for a colonoscopy beginning at age 50 years and continuing until age 75 years.  Hepatitis C blood test.** / For all people born from 1945 through 1965 and any individual with known risks for hepatitis C.  Osteoporosis screening.** / A one-time screening for women ages 65 years and over and women at risk for fractures or osteoporosis.  Skin self-exam. / Monthly.  Influenza vaccine. / Every year.  Tetanus, diphtheria, and acellular pertussis (Tdap/Td) vaccine.** / 1 dose of Td every 10 years.  Zoster vaccine.** / 1 dose for adults aged 60 years or older.  Pneumococcal 13-valent conjugate (PCV13) vaccine.** / Consult your health care provider.    Pneumococcal polysaccharide (PPSV23) vaccine.** / 1 dose for all adults aged 68 years and older. Screening  for abdominal aortic aneurysm (AAA)  by ultrasound is recommended for people who have history of high blood pressure or who are current or former smokers.

## 2016-01-07 NOTE — Progress Notes (Signed)
Patient ID: Lydia Edwards, female   DOB: 12-May-1951, 65 y.o.   MRN: [id number]  Comprehensive Evaluation &  Examination  This very nice 65 y.o. [name] presents for presents for a comprehensive evaluation and management of multiple medical co-morbidities.  Patient has been followed for HTN, Prediabetes, Hyperlipidemia and Vitamin D Deficiency. Patient has recent hospitalization in Nov 2016 with PNA & severe Dehydration and AKI requiring Dialysis, but miraculously she had recovery of her kidney functions to her base line stage 3 CKD. She also had a hospitalization in 2011 with ARF attributed to NSAID use and likewise recovered.     HTN predates since  2002. Patient's BP has been controlled at home and patient denies any cardiac symptoms as chest pain, palpitations, shortness of breath, dizziness or ankle swelling. Today's BP: 132/76 mmHg    Patient's hyperlipidemia is controlled with diet and medications. Patient denies myalgias or other medication SE's. Last lipids were 10/06/2015: Cholesterol 183; HDL 44*; LDL Cholesterol 125; Triglycerides 68 on    Patient has prediabetes predating since 2011 with A1c 6.1% , then 5.8% in 2013, 6.2% in 2014 and later 5.7% in 2014. and patient denies reactive hypoglycemic symptoms, visual blurring, diabetic polys, or paresthesias. Last A1c was  6.1% on 10/06/2015.   Finally, patient has history of Vitamin D Deficiency of "12" in 2012  And she supplements sporadically with last Vitamin D was still low at  41 on 06/16/2015.    Medication Sig  . acetaminophen  500 MG Take 500 mg by mouth every 6 (six) hours as needed for mild pain or headache.   Marland Kitchen atorvastatin 40 MG  Take 1 tablet (40 mg total) by mouth daily.  . Esomeprazole  20 mg Take 1 capsule by mouth daily.  . furosemide  20 MG  Take 1 tablet (20 mg total) by mouth daily.   Allergies  Allergen Reactions  . Ace Inhibitors   . Naproxen     REACTION: renal failure  . Norvasc [Amlodipine Besylate]     edema  .  Nsaids    Past Medical History  Diagnosis Date  . Hyperlipidemia   . Hypertension   . Vitamin D deficiency   . Prediabetes    Health Maintenance  Topic Date Due  . Hepatitis C Screening  12/27/1950  . HIV Screening  05/26/1965  . DEXA SCAN  06/10/2016 (Originally 05/27/2015)  . INFLUENZA VACCINE  07/14/2016 (Originally 06/29/2016)  . ZOSTAVAX  09/22/2017 (Originally 05/26/2010)  . TETANUS/TDAP  09/22/2017 (Originally 02/18/2010)  . PNA vac Low Risk Adult (2 of 2 - PCV13) 09/23/2017 (Originally 10/28/2016)  . MAMMOGRAM  06/29/2017  . COLONOSCOPY  09/09/2025   Immunization History  Administered Date(s) Administered  . Influenza,trivalent, recombinat, inj, PF 09/07/2015  . Influenza-Unspecified 08/30/2015  . Pneumococcal Polysaccharide-23 10/29/2015  . Td 02/19/2000   Past Surgical History  Procedure Laterality Date  . Abdominal hysterectomy     Family History  Problem Relation Age of Onset  . Stroke Sister   . Hypertension Sister   . Cancer Sister   . Heart attack Brother   . Colon cancer Neg Hx    Social History  Substance Use Topics  . Smoking status: Never Smoker   . Smokeless tobacco: Never Used  . Alcohol Use: No    ROS Constitutional: Denies fever, chills, weight loss/gain, headaches, insomnia,  night sweats, and change in appetite. Does c/o fatigue. Eyes: Denies redness, blurred vision, diplopia, discharge, itchy, watery eyes.  ENT: Denies discharge, congestion,  post nasal drip, epistaxis, sore throat, earache, hearing loss, dental pain, Tinnitus, Vertigo, Sinus pain, snoring.  Cardio: Denies chest pain, palpitations, irregular heartbeat, syncope, dyspnea, diaphoresis, orthopnea, PND, claudication, edema Respiratory: denies cough, dyspnea, DOE, pleurisy, hoarseness, laryngitis, wheezing.  Gastrointestinal: Denies dysphagia, heartburn, reflux, water brash, pain, cramps, nausea, vomiting, bloating, diarrhea, constipation, hematemesis, melena, hematochezia, jaundice,  hemorrhoids Genitourinary: Denies dysuria, frequency, urgency, nocturia, hesitancy, discharge, hematuria, flank pain Breast: Breast lumps, nipple discharge, bleeding.  Musculoskeletal: Denies arthralgia, myalgia, stiffness, Jt. Swelling, pain, limp, and strain/sprain. Denies falls. Skin: Denies puritis, rash, hives, warts, acne, eczema, changing in skin lesion Neuro: No weakness, tremor, incoordination, spasms, paresthesia, pain Psychiatric: Denies confusion, memory loss, sensory loss. Denies Depression. Endocrine: Denies change in weight, skin, hair change, nocturia, and paresthesia, diabetic polys, visual blurring, hyper / hypo glycemic episodes.  Heme/Lymph: No excessive bleeding, bruising, enlarged lymph nodes.  Physical Exam  BP 132/76 mmHg  Pulse 64  Temp(Src) 97.5 F (36.4 C)  Resp 16  Ht 5' 4.5" (1.638 m)  Wt 182 lb (82.555 kg)  BMI 30.77 kg/m2  General Appearance: Well nourished and in no apparent distress. Eyes: PERRLA, EOMs, conjunctiva no swelling or erythema, normal fundi and vessels. Sinuses: No frontal/maxillary tenderness ENT/Mouth: EACs patent / TMs  nl. Nares clear without erythema, swelling, mucoid exudates. Oral hygiene is good. No erythema, swelling, or exudate. Tongue normal, non-obstructing. Tonsils not swollen or erythematous. Hearing normal.  Neck: Supple, thyroid normal. No bruits, nodes or JVD. Respiratory: Respiratory effort normal.  BS equal and clear bilateral without rales, rhonci, wheezing or stridor. Cardio: Heart sounds are normal with regular rate and rhythm and no murmurs, rubs or gallops. Peripheral pulses are normal and equal bilaterally without edema. No aortic or femoral bruits. Chest: symmetric with normal excursions and percussion. Breasts: Symmetric, without lumps, nipple discharge, retractions, or fibrocystic changes.  Abdomen: Flat, soft, with bowl sounds. Nontender, no guarding, rebound, hernias, masses, or organomegaly.  Lymphatics: Non  tender without lymphadenopathy.  Musculoskeletal: Full ROM all peripheral extremities, joint stability, 5/5 strength, and normal gait. Skin: Warm and dry without rashes, lesions, cyanosis, clubbing or  ecchymosis.  Neuro: Cranial nerves intact, reflexes equal bilaterally. Normal muscle tone, no cerebellar symptoms. Sensation intact.  Pysch: Alert and oriented X 3, normal affect, Insight and Judgment appropriate.   Assessment and Plan  1. Essential hypertension  - Microalbumin / creatinine urine ratio - EKG 12-Lead - [place], RETROPERITNL ABD,  LTD - TSH  2. Hyperlipidemia  - Lipid panel - TSH  3. Other abnormal glucose  - Hemoglobin A1c - Insulin, random  4. Vitamin D deficiency  - VITAMIN D 25 Hydroxy   5. Morbid obesity ([place])   6. Screening for rectal cancer  - POC Hemoccult Bld/Stl   7. Medication management - Urinalysis, Routine w reflex microscopic  - CBC with Differential/Platelet - BASIC METABOLIC PANEL WITH GFR - Hepatic function panel - Magnesium   Continue prudent diet as discussed, weight control, BP monitoring, regular exercise, and medications. Discussed med's effects and SE's. Screening labs and tests as requested with regular follow-up as recommended. Over 40 minutes of exam, counseling, chart review and high complex critical decision making was performed.

## 2016-01-08 LAB — LIPID PANEL
CHOLESTEROL: 167 mg/dL (ref 125–200)
HDL: 41 mg/dL — ABNORMAL LOW (ref >=46)
LDL Cholesterol: 98 mg/dL (ref <130)
Total CHOL/HDL Ratio: 4.1 Ratio (ref <=5.0)
Triglycerides: 139 mg/dL (ref <150)
VLDL: 28 mg/dL (ref <30)

## 2016-01-08 LAB — BASIC METABOLIC PANEL WITH GFR
BUN: 8 mg/dL (ref 7–25)
CALCIUM: 9.2 mg/dL (ref 8.6–10.4)
CO2: 27 mmol/L (ref 20–31)
CREATININE: 0.55 mg/dL (ref 0.50–0.99)
Chloride: 104 mmol/L (ref 98–110)
GFR, Est African American: >89 mL/min (ref >=60)
GFR, Est Non African American: >89 mL/min (ref >=60)
GLUCOSE: 97 mg/dL (ref 65–99)
Potassium: 4 mmol/L (ref 3.5–5.3)
Sodium: 141 mmol/L (ref 135–146)

## 2016-01-08 LAB — HEPATIC FUNCTION PANEL
ALT: 18 U/L (ref 6–29)
AST: 19 U/L (ref 10–35)
Albumin: 3.6 g/dL (ref 3.6–5.1)
Alkaline Phosphatase: 65 U/L (ref 33–130)
Bilirubin, Direct: 0.1 mg/dL (ref <=0.2)
Indirect Bilirubin: 0.2 mg/dL (ref 0.2–1.2)
TOTAL PROTEIN: 6.6 g/dL (ref 6.1–8.1)
Total Bilirubin: 0.3 mg/dL (ref 0.2–1.2)

## 2016-01-08 LAB — URINALYSIS, ROUTINE W REFLEX MICROSCOPIC
BILIRUBIN URINE: NEGATIVE
Glucose, UA: NEGATIVE
Ketones, ur: NEGATIVE
NITRITE: NEGATIVE
PROTEIN: NEGATIVE
SPECIFIC GRAVITY, URINE: 1.01 (ref 1.001–1.035)
pH: 5.5 (ref 5.0–8.0)

## 2016-01-08 LAB — URINALYSIS, MICROSCOPIC ONLY
CASTS: NONE SEEN [LPF]
YEAST: NONE SEEN [HPF]

## 2016-01-08 LAB — MICROALBUMIN / CREATININE URINE RATIO
Creatinine, Urine: 84 mg/dL (ref 20–320)
MICROALB/CREAT RATIO: 23 ug/mg{creat} (ref <30)
Microalb, Ur: 1.9 mg/dL

## 2016-01-08 LAB — MAGNESIUM: MAGNESIUM: 1.6 mg/dL (ref 1.5–2.5)

## 2016-01-08 LAB — HEMOGLOBIN A1C
HEMOGLOBIN A1C: 5.9 % — AB (ref <5.7)
MEAN PLASMA GLUCOSE: 123 mg/dL — AB (ref <117)

## 2016-01-08 LAB — INSULIN, RANDOM: INSULIN: 25.9 u[IU]/mL — AB (ref 2.0–19.6)

## 2016-01-08 LAB — VITAMIN D 25 HYDROXY (VIT D DEFICIENCY, FRACTURES): Vit D, 25-Hydroxy: 24 ng/mL — ABNORMAL LOW (ref 30–100)

## 2016-01-08 LAB — TSH: TSH: 1.76 mIU/L

## 2016-01-26 ENCOUNTER — Other Ambulatory Visit: Payer: Self-pay | Admitting: *Deleted

## 2016-01-26 DIAGNOSIS — Z1212 Encounter for screening for malignant neoplasm of rectum: Secondary | ICD-10-CM

## 2016-01-26 LAB — POC HEMOCCULT BLD/STL (HOME/3-CARD/SCREEN)
FECAL OCCULT BLD: NEGATIVE
FECAL OCCULT BLD: NEGATIVE
Fecal Occult Blood, POC: NEGATIVE

## 2016-02-28 ENCOUNTER — Other Ambulatory Visit: Payer: Self-pay | Admitting: Internal Medicine

## 2016-04-07 ENCOUNTER — Ambulatory Visit: Payer: Self-pay | Admitting: Internal Medicine

## 2016-04-19 ENCOUNTER — Ambulatory Visit (INDEPENDENT_AMBULATORY_CARE_PROVIDER_SITE_OTHER): Payer: PPO | Admitting: Internal Medicine

## 2016-04-19 ENCOUNTER — Encounter: Payer: Self-pay | Admitting: Internal Medicine

## 2016-04-19 VITALS — BP 128/70 | HR 72 | Temp 98.2°F | Resp 16 | Ht 64.5 in | Wt 190.0 lb

## 2016-04-19 DIAGNOSIS — Z79899 Other long term (current) drug therapy: Secondary | ICD-10-CM | POA: Diagnosis not present

## 2016-04-19 DIAGNOSIS — M7552 Bursitis of left shoulder: Secondary | ICD-10-CM

## 2016-04-19 DIAGNOSIS — E785 Hyperlipidemia, unspecified: Secondary | ICD-10-CM | POA: Diagnosis not present

## 2016-04-19 DIAGNOSIS — R7309 Other abnormal glucose: Secondary | ICD-10-CM

## 2016-04-19 DIAGNOSIS — I10 Essential (primary) hypertension: Secondary | ICD-10-CM

## 2016-04-19 LAB — BASIC METABOLIC PANEL WITH GFR
BUN: 13 mg/dL (ref 7–25)
CHLORIDE: 100 mmol/L (ref 98–110)
CO2: 32 mmol/L — AB (ref 20–31)
CREATININE: 0.86 mg/dL (ref 0.50–0.99)
Calcium: 9.4 mg/dL (ref 8.6–10.4)
GFR, Est African American: 82 mL/min (ref >=60)
GFR, Est Non African American: 71 mL/min (ref >=60)
Glucose, Bld: 103 mg/dL — ABNORMAL HIGH (ref 65–99)
Potassium: 3.9 mmol/L (ref 3.5–5.3)
SODIUM: 140 mmol/L (ref 135–146)

## 2016-04-19 LAB — CBC WITH DIFFERENTIAL/PLATELET
BASOS ABS: 0 {cells}/uL (ref 0–200)
Basophils Relative: 0 %
EOS ABS: 150 {cells}/uL (ref 15–500)
EOS PCT: 2 %
HCT: 43 % (ref 35.0–45.0)
HEMOGLOBIN: 13.9 g/dL (ref 11.7–15.5)
Lymphocytes Relative: 40 %
Lymphs Abs: 3000 cells/uL (ref 850–3900)
MCH: 26.9 pg — AB (ref 27.0–33.0)
MCHC: 32.3 g/dL (ref 32.0–36.0)
MCV: 83.2 fL (ref 80.0–100.0)
MONO ABS: 525 {cells}/uL (ref 200–950)
MPV: 9.3 fL (ref 7.5–12.5)
Monocytes Relative: 7 %
Neutro Abs: 3825 cells/uL (ref 1500–7800)
Neutrophils Relative %: 51 %
Platelets: 243 10*3/uL (ref 140–400)
RBC: 5.17 MIL/uL — ABNORMAL HIGH (ref 3.80–5.10)
RDW: 13.9 % (ref 11.0–15.0)
WBC: 7.5 10*3/uL (ref 3.8–10.8)

## 2016-04-19 LAB — LIPID PANEL
CHOLESTEROL: 137 mg/dL (ref 125–200)
HDL: 43 mg/dL — AB (ref >=46)
LDL CALC: 79 mg/dL (ref <130)
TRIGLYCERIDES: 74 mg/dL (ref <150)
Total CHOL/HDL Ratio: 3.2 Ratio (ref <=5.0)
VLDL: 15 mg/dL (ref <30)

## 2016-04-19 LAB — TSH: TSH: 1.24 mIU/L

## 2016-04-19 LAB — HEPATIC FUNCTION PANEL
ALT: 18 U/L (ref 6–29)
AST: 22 U/L (ref 10–35)
Albumin: 4.3 g/dL (ref 3.6–5.1)
Alkaline Phosphatase: 65 U/L (ref 33–130)
BILIRUBIN INDIRECT: 0.5 mg/dL (ref 0.2–1.2)
Bilirubin, Direct: 0.1 mg/dL (ref <=0.2)
Total Bilirubin: 0.6 mg/dL (ref 0.2–1.2)
Total Protein: 7.4 g/dL (ref 6.1–8.1)

## 2016-04-19 MED ORDER — CYCLOBENZAPRINE HCL 10 MG PO TABS: 10.0000 mg | ORAL_TABLET | Freq: Three times a day (TID) | ORAL | Status: AC | PRN

## 2016-04-19 NOTE — Patient Instructions (Signed)

## 2016-04-19 NOTE — Progress Notes (Signed)
Assessment and Plan:  Hypertension:  -Continue medication -monitor blood pressure at home. -Continue DASH diet -Reminder to go to the ER if any CP, SOB, nausea, dizziness, severe HA, changes vision/speech, left arm numbness and tingling and jaw pain.  Cholesterol - Continue diet and exercise -Check cholesterol.   Diabetes with diabetic chronic kidney disease -Continue diet and exercise.  -Check A1C  Vitamin D Def -check level -continue medications.   Left Shoulder pain -consistent with bursitis -due to kidney disease will try to avoid NSAIDs -flexeril -refer to ortho for possible IA injection  Continue diet and meds as discussed. Further disposition pending results of labs. Discussed med's effects and SE's.    HPI 65 y.o. female  presents for 3 month follow up with hypertension, hyperlipidemia, diabetes and vitamin D deficiency.   Her blood pressure has been controlled at home, today their BP is BP: 128/70 mmHg.She does workout. She denies chest pain, shortness of breath, dizziness.  She reports that she hasn't checked it recently but doesn't know whether it has been up.     She is on cholesterol medication and denies myalgias. Her cholesterol is at goal. The cholesterol was:  01/07/2016: Cholesterol 167; HDL 41*; LDL Cholesterol 98; Triglycerides 139.   She notes no leg cramping.  She does not have any problems with the medications.     She has been working on diet and exercise for prediabetes with diabetic chronic kidney disease, she is not on bASA, she is not on ACE/ARB, and denies  foot ulcerations, hyperglycemia, hypoglycemia , increased appetite, nausea, paresthesia of the feet, polydipsia, polyuria, visual disturbances, vomiting and weight loss. Last A1C was: 01/07/2016: Hgb A1c MFr Bld 5.9*   Patient is on Vitamin D supplement. 01/07/2016: Vit D, 25-Hydroxy 24*  She reports that her left shoulder has been bothering her.  Especially overhead movements.  She reports that she  has a dull aching sensations.  She notes that she has no previous injuries.    Current Medications:  Current Outpatient Prescriptions on File Prior to Visit  Medication Sig Dispense Refill  . acetaminophen (TYLENOL) 500 MG tablet Take 500 mg by mouth every 6 (six) hours as needed for mild pain or headache.     Marland Kitchen atorvastatin (LIPITOR) 40 MG tablet TAKE ONE TABLET BY MOUTH DAILY 90 tablet 1  . Esomeprazole Magnesium (NEXIUM PO) Take 1 capsule by mouth daily.    . furosemide (LASIX) 20 MG tablet Take 1 tablet (20 mg total) by mouth daily. 30 tablet 11  . potassium chloride SA (K-DUR,KLOR-CON) 20 MEQ tablet   3   No current facility-administered medications on file prior to visit.   Medical History:  Past Medical History  Diagnosis Date  . Hyperlipidemia   . Hypertension   . Vitamin D deficiency   . Prediabetes    Allergies:  Allergies  Allergen Reactions  . Ace Inhibitors   . Naproxen     REACTION: renal failure  . Norvasc [Amlodipine Besylate]     edema  . Nsaids      Review of Systems:  Review of Systems  Constitutional: Negative for fever, chills and malaise/fatigue.  HENT: Negative for congestion, ear pain and sore throat.   Eyes: Negative.   Respiratory: Negative for cough, shortness of breath and wheezing.   Cardiovascular: Negative for chest pain, palpitations and leg swelling.  Gastrointestinal: Negative for heartburn, abdominal pain, diarrhea, constipation, blood in stool and melena.  Genitourinary: Negative.   Skin: Negative.   Neurological:  Negative for dizziness, loss of consciousness and headaches.  Psychiatric/Behavioral: Negative for depression. The patient is not nervous/anxious and does not have insomnia.     Family history- Review and unchanged  Social history- Review and unchanged  Physical Exam: BP 128/70 mmHg  Pulse 72  Temp(Src) 98.2 F (36.8 C) (Temporal)  Resp 16  Ht 5' 4.5" (1.638 m)  Wt 190 lb (86.183 kg)  BMI 32.12 kg/m2 Wt Readings  from Last 3 Encounters:  04/19/16 190 lb (86.183 kg)  01/07/16 182 lb (82.555 kg)  12/18/15 175 lb (79.379 kg)   General Appearance: Well nourished well developed, non-toxic appearing, in no apparent distress. Eyes: PERRLA, EOMs, conjunctiva no swelling or erythema ENT/Mouth: Ear canals clear with no erythema, swelling, or discharge.  TMs normal bilaterally, oropharynx clear, moist, with no exudate.   Neck: Supple, thyroid normal, no JVD, no cervical adenopathy.  Respiratory: Respiratory effort normal, breath sounds clear A&P, no wheeze, rhonchi or rales noted.  No retractions, no accessory muscle usage Cardio: RRR with no MRGs. No noted edema.  Abdomen: Soft, + BS.  Non tender, no guarding, rebound, hernias, masses. Musculoskeletal:Normal gait, Mildly limited left shoulder active ROM.  Tenderness to palpation of the left biceps tendon and left AC joint.  Positive speeds test.  Positive empty can. Negative lift off, negative belly press.   Skin: Warm, dry without rashes, lesions, ecchymosis.  Neuro: Awake and oriented X 3, Cranial nerves intact. No cerebellar symptoms.  Psych: normal affect, Insight and Judgment appropriate.    Starlyn Skeans, PA-C 12:08 PM [facility]

## 2016-04-20 LAB — HEMOGLOBIN A1C
HEMOGLOBIN A1C: 6.7 % — AB (ref <5.7)
Mean Plasma Glucose: 146 mg/dL

## 2016-04-28 DIAGNOSIS — M7542 Impingement syndrome of left shoulder: Secondary | ICD-10-CM | POA: Diagnosis not present

## 2016-05-19 ENCOUNTER — Other Ambulatory Visit: Payer: Self-pay | Admitting: *Deleted

## 2016-05-19 DIAGNOSIS — E2839 Other primary ovarian failure: Secondary | ICD-10-CM

## 2016-05-31 ENCOUNTER — Other Ambulatory Visit: Payer: Self-pay | Admitting: Physician Assistant

## 2016-06-14 ENCOUNTER — Ambulatory Visit
Admission: RE | Admit: 2016-06-14 | Discharge: 2016-06-14 | Disposition: A | Discharge status: Home / Self Care | Payer: PPO | Source: Ambulatory Visit | Attending: Internal Medicine | Admitting: Internal Medicine

## 2016-06-14 DIAGNOSIS — E2839 Other primary ovarian failure: Secondary | ICD-10-CM

## 2016-06-14 DIAGNOSIS — Z780 Asymptomatic menopausal state: Secondary | ICD-10-CM | POA: Diagnosis not present

## 2016-06-14 DIAGNOSIS — Z13820 Encounter for screening for osteoporosis: Secondary | ICD-10-CM | POA: Diagnosis not present

## 2016-07-05 DIAGNOSIS — I10 Essential (primary) hypertension: Secondary | ICD-10-CM | POA: Diagnosis not present

## 2016-07-05 DIAGNOSIS — H11153 Pinguecula, bilateral: Secondary | ICD-10-CM | POA: Diagnosis not present

## 2016-07-05 DIAGNOSIS — H40013 Open angle with borderline findings, low risk, bilateral: Secondary | ICD-10-CM | POA: Diagnosis not present

## 2016-07-05 DIAGNOSIS — H18413 Arcus senilis, bilateral: Secondary | ICD-10-CM | POA: Diagnosis not present

## 2016-07-05 DIAGNOSIS — H43811 Vitreous degeneration, right eye: Secondary | ICD-10-CM | POA: Diagnosis not present

## 2016-07-05 DIAGNOSIS — H35033 Hypertensive retinopathy, bilateral: Secondary | ICD-10-CM | POA: Diagnosis not present

## 2016-07-05 DIAGNOSIS — H2513 Age-related nuclear cataract, bilateral: Secondary | ICD-10-CM | POA: Diagnosis not present

## 2016-07-05 DIAGNOSIS — H25013 Cortical age-related cataract, bilateral: Secondary | ICD-10-CM | POA: Diagnosis not present

## 2016-07-05 DIAGNOSIS — H52223 Regular astigmatism, bilateral: Secondary | ICD-10-CM | POA: Diagnosis not present

## 2016-07-05 DIAGNOSIS — H11423 Conjunctival edema, bilateral: Secondary | ICD-10-CM | POA: Diagnosis not present

## 2016-07-05 DIAGNOSIS — H5211 Myopia, right eye: Secondary | ICD-10-CM | POA: Diagnosis not present

## 2016-07-17 ENCOUNTER — Other Ambulatory Visit: Payer: Self-pay | Admitting: Internal Medicine

## 2016-07-19 ENCOUNTER — Encounter: Payer: Self-pay | Admitting: Internal Medicine

## 2016-07-19 ENCOUNTER — Ambulatory Visit (INDEPENDENT_AMBULATORY_CARE_PROVIDER_SITE_OTHER): Payer: PPO | Admitting: Internal Medicine

## 2016-07-19 VITALS — BP 128/80 | HR 72 | Temp 97.2°F | Resp 16 | Ht 64.5 in | Wt 192.4 lb

## 2016-07-19 DIAGNOSIS — N182 Chronic kidney disease, stage 2 (mild): Secondary | ICD-10-CM

## 2016-07-19 DIAGNOSIS — E559 Vitamin D deficiency, unspecified: Secondary | ICD-10-CM | POA: Diagnosis not present

## 2016-07-19 DIAGNOSIS — R7309 Other abnormal glucose: Secondary | ICD-10-CM | POA: Diagnosis not present

## 2016-07-19 DIAGNOSIS — E785 Hyperlipidemia, unspecified: Secondary | ICD-10-CM | POA: Diagnosis not present

## 2016-07-19 DIAGNOSIS — Z79899 Other long term (current) drug therapy: Secondary | ICD-10-CM

## 2016-07-19 DIAGNOSIS — E119 Type 2 diabetes mellitus without complications: Secondary | ICD-10-CM | POA: Diagnosis not present

## 2016-07-19 DIAGNOSIS — I10 Essential (primary) hypertension: Secondary | ICD-10-CM | POA: Diagnosis not present

## 2016-07-19 LAB — CBC WITH DIFFERENTIAL/PLATELET
BASOS PCT: 0 %
Basophils Absolute: 0 cells/uL (ref 0–200)
EOS PCT: 4 %
Eosinophils Absolute: 292 cells/uL (ref 15–500)
HCT: 40.9 % (ref 35.0–45.0)
Hemoglobin: 13.2 g/dL (ref 11.7–15.5)
LYMPHS ABS: 2701 {cells}/uL (ref 850–3900)
LYMPHS PCT: 37 %
MCH: 27.2 pg (ref 27.0–33.0)
MCHC: 32.3 g/dL (ref 32.0–36.0)
MCV: 84.3 fL (ref 80.0–100.0)
MPV: 9.1 fL (ref 7.5–12.5)
Monocytes Absolute: 511 cells/uL (ref 200–950)
Monocytes Relative: 7 %
NEUTROS PCT: 52 %
Neutro Abs: 3796 cells/uL (ref 1500–7800)
Platelets: 267 10*3/uL (ref 140–400)
RBC: 4.85 MIL/uL (ref 3.80–5.10)
RDW: 14.1 % (ref 11.0–15.0)
WBC: 7.3 10*3/uL (ref 3.8–10.8)

## 2016-07-19 LAB — HEPATIC FUNCTION PANEL
ALT: 12 U/L (ref 6–29)
AST: 17 U/L (ref 10–35)
Albumin: 4.4 g/dL (ref 3.6–5.1)
Alkaline Phosphatase: 65 U/L (ref 33–130)
BILIRUBIN DIRECT: 0.1 mg/dL (ref <=0.2)
BILIRUBIN INDIRECT: 0.5 mg/dL (ref 0.2–1.2)
BILIRUBIN TOTAL: 0.6 mg/dL (ref 0.2–1.2)
Total Protein: 7.4 g/dL (ref 6.1–8.1)

## 2016-07-19 LAB — BASIC METABOLIC PANEL WITH GFR
BUN: 11 mg/dL (ref 7–25)
CALCIUM: 9.4 mg/dL (ref 8.6–10.4)
CHLORIDE: 103 mmol/L (ref 98–110)
CO2: 29 mmol/L (ref 20–31)
CREATININE: 0.87 mg/dL (ref 0.50–0.99)
GFR, EST NON AFRICAN AMERICAN: 70 mL/min (ref >=60)
GFR, Est African American: 80 mL/min (ref >=60)
Glucose, Bld: 94 mg/dL (ref 65–99)
Potassium: 4.4 mmol/L (ref 3.5–5.3)
SODIUM: 139 mmol/L (ref 135–146)

## 2016-07-19 LAB — MAGNESIUM: MAGNESIUM: 1.8 mg/dL (ref 1.5–2.5)

## 2016-07-19 LAB — LIPID PANEL
CHOL/HDL RATIO: 2.9 ratio (ref <=5.0)
CHOLESTEROL: 121 mg/dL — AB (ref 125–200)
HDL: 42 mg/dL — AB (ref >=46)
LDL Cholesterol: 63 mg/dL (ref <130)
Triglycerides: 80 mg/dL (ref <150)
VLDL: 16 mg/dL (ref <30)

## 2016-07-19 LAB — TSH: TSH: 1.75 mIU/L

## 2016-07-19 NOTE — Progress Notes (Signed)
[facility]                       Unk Pinto, M.D.        [name], [name]       Starlyn Skeans, [name]   [facility]                [address] [place], [place] [id number] Telephone [phone] Telefax [phone] ______________________________________________________________________     This very nice 65y.o.[name] presents for 3 month follow up with Hypertension, Hyperlipidemia, Pre-Diabetes and Vitamin D Deficiency. She also has GERD controlled w/diet & meds.     Patient is treated for HTN & BP has been controlled at home. Today's BP is at goal of 128/80. Patient has had no complaints of any cardiac type chest pain, palpitations, dyspnea/orthopnea/PND, dizziness, claudication, or dependent edema. Of note patient was hospitalized in Nov 2016 with UTI & NSAID use and concomitant dehydration induced ARF and was dialyzed and fortunately renal functions recovered with IVF rescue.     Hyperlipidemia is controlled with diet & meds. Patient denies myalgias or other med SE's. Last Lipids were at goal: Lab Results  Component Value Date   CHOL 137 04/19/2016   HDL 43 (L) 04/19/2016   LDLCALC 79 04/19/2016   LDLDIRECT 160.1 07/18/2009   TRIG 74 04/19/2016   CHOLHDL 3.2 04/19/2016      Also, the patient has history of  PreDiabetes circa 2012 with A1c 6.2%, then dropping to 5.8% in 2013, again rising to 6.2% in 2014 and again dropping to 5.7% later in 2014  But most recent A1c is up to 6.7% in the T2_DM range and she denies any symptoms of reactive hypoglycemia, diabetic polys, paresthesias or visual blurring. She prefers to try to control with diet.      Further, the patient also has history of Vitamin D Deficiency of "12" in 2012 and supplements vitamin D without any suspected side-effects. Last vitamin D was still very low :  Lab Results  Component Value Date   VD25OH 24 (L) 01/07/2016    Current Outpatient Prescriptions on File Prior to Visit  Medication Sig  . acetaminophen (TYLENOL) 500 MG tablet Take 500 mg by mouth every 6 (six) hours as needed for mild pain or headache.   Marland Kitchen atorvastatin (LIPITOR) 40 MG tablet TAKE ONE TABLET BY MOUTH DAILY  . cyclobenzaprine (FLEXERIL) 10 MG tablet Take 1 tablet (10 mg total) by mouth every 8 (eight) hours as needed for muscle spasms.  . Esomeprazole Magnesium (NEXIUM PO) Take 1 capsule by mouth daily.  . furosemide (LASIX) 20 MG tablet Take 1 tablet (20 mg total) by mouth daily.  . potassium chloride SA (K-DUR,KLOR-CON) 20 MEQ tablet TAKE 2 TABLETS BY MOUTH DAILY FOR 2 DAYS THEN TAKE 1 TABLET BY MOUTH DAILY (Patient taking differently: takes 1 tablet daily)   No current facility-administered medications on file prior to visit.    Allergies  Allergen Reactions  . Ace Inhibitors   . Naproxen     REACTION: renal failure  . Norvasc [Amlodipine Besylate]     edema  . Nsaids    PMHx:   Past Medical History:  Diagnosis Date  . Hyperlipidemia   . Hypertension   . Prediabetes   . Vitamin D deficiency    Immunization History  Administered Date(s) Administered  .  Influenza,trivalent, recombinat, inj, PF 09/07/2015  . Influenza-Unspecified 08/30/2015  . Pneumococcal Polysaccharide-23 10/29/2015  . Td 02/19/2000   Past Surgical History:  Procedure Laterality Date  . ABDOMINAL HYSTERECTOMY     FHx:    Reviewed / unchanged  SHx:    Reviewed / unchanged  Systems Review:  Constitutional: Denies fever, chills, wt changes, headaches, insomnia, fatigue, night sweats, change in appetite. Eyes: Denies redness, blurred vision, diplopia, discharge, itchy, watery eyes.  ENT: Denies discharge, congestion, post nasal drip, epistaxis, sore throat, earache, hearing loss, dental pain, tinnitus, vertigo, sinus pain, snoring.  CV: Denies chest pain, palpitations, irregular heartbeat, syncope, dyspnea, diaphoresis, orthopnea, PND,  claudication or edema. Respiratory: denies cough, dyspnea, DOE, pleurisy, hoarseness, laryngitis, wheezing.  Gastrointestinal: Denies dysphagia, odynophagia, heartburn, reflux, water brash, abdominal pain or cramps, nausea, vomiting, bloating, diarrhea, constipation, hematemesis, melena, hematochezia  or hemorrhoids. Genitourinary: Denies dysuria, frequency, urgency, nocturia, hesitancy, discharge, hematuria or flank pain. Musculoskeletal: Denies arthralgias, myalgias, stiffness, jt. swelling, pain, limping or strain/sprain.  Skin: Denies pruritus, rash, hives, warts, acne, eczema or change in skin lesion(s). Neuro: No weakness, tremor, incoordination, spasms, paresthesia or pain. Psychiatric: Denies confusion, memory loss or sensory loss. Endo: Denies change in weight, skin or hair change.  Heme/Lymph: No excessive bleeding, bruising or enlarged lymph nodes.  Physical Exam  BP 128/80   Pulse 72   Temp 97.2 F (36.2 C)   Resp 16   Ht 5' 4.5" (1.638 m)   Wt 192 lb 6.4 oz (87.3 kg)   BMI 32.52 kg/m   Appears well nourished and in no distress. Eyes: PERRLA, EOMs, conjunctiva no swelling or erythema. Sinuses: No frontal/maxillary tenderness ENT/Mouth: EAC's clear, TM's nl w/o erythema, bulging. Nares clear w/o erythema, swelling, exudates. Oropharynx clear without erythema or exudates. Oral hygiene is good. Tongue normal, non obstructing. Hearing intact.  Neck: Supple. Thyroid nl. Car 2+/2+ without bruits, nodes or JVD. Chest: Respirations nl with BS clear & equal w/o rales, rhonchi, wheezing or stridor.  Cor: Heart sounds normal w/ regular rate and rhythm without sig. murmurs, gallops, clicks, or rubs. Peripheral pulses normal and equal  without edema.  Abdomen: Soft & bowel sounds normal. Non-tender w/o guarding, rebound, hernias, masses, or organomegaly.  Lymphatics: Unremarkable.  Musculoskeletal: Full ROM all peripheral extremities, joint stability, 5/5 strength, and normal gait.   Skin: Warm, dry without exposed rashes, lesions or ecchymosis apparent.  Neuro: Cranial nerves intact, reflexes equal bilaterally. Sensory-motor testing grossly intact. Tendon reflexes grossly intact.  Pysch: Alert & oriented x 3.  Insight and judgement nl & appropriate. No ideations.  Assessment and Plan:  1. Essential hypertension  - Continue medication, monitor blood pressure at home. Continue DASH diet. Reminder to go to the ER if any CP, SOB, nausea, dizziness, severe HA, changes vision/speech, left arm numbness and tingling and jaw pain. - TSH  2. Hyperlipidemia  - Continue diet/meds, exercise,& lifestyle modifications. 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 Monitor appropriate labs. - Hemoglobin A1c - Insulin, random  4. Vitamin D deficiency  - Continue supplementation. - VITAMIN D 25 Hydroxy   5. Chronic kidney disease (CKD) stage 2   6. Medication management  - CBC with Differential/Platelet - BASIC METABOLIC PANEL WITH GFR - Hepatic function panel - Magnesium   Recommended regular exercise, BP monitoring, weight control, and discussed med and SE's. Recommended labs to assess and monitor clinical status. Further disposition pending results  of labs. Over 30 minutes of exam, counseling, chart review was performed

## 2016-07-19 NOTE — Patient Instructions (Signed)

## 2016-07-20 LAB — INSULIN, RANDOM: INSULIN: 11 u[IU]/mL (ref 2.0–19.6)

## 2016-07-20 LAB — VITAMIN D 25 HYDROXY (VIT D DEFICIENCY, FRACTURES): Vit D, 25-Hydroxy: 34 ng/mL (ref 30–100)

## 2016-07-20 LAB — HEMOGLOBIN A1C
HEMOGLOBIN A1C: 5.8 % — AB (ref <5.7)
MEAN PLASMA GLUCOSE: 120 mg/dL

## 2016-08-30 ENCOUNTER — Other Ambulatory Visit: Payer: Self-pay | Admitting: Internal Medicine

## 2016-08-30 DIAGNOSIS — Z1231 Encounter for screening mammogram for malignant neoplasm of breast: Secondary | ICD-10-CM

## 2016-09-13 ENCOUNTER — Ambulatory Visit (INDEPENDENT_AMBULATORY_CARE_PROVIDER_SITE_OTHER): Payer: PPO | Admitting: Internal Medicine

## 2016-09-13 ENCOUNTER — Ambulatory Visit
Admission: RE | Admit: 2016-09-13 | Discharge: 2016-09-13 | Disposition: A | Discharge status: Home / Self Care | Payer: PPO | Source: Ambulatory Visit | Attending: Internal Medicine | Admitting: Internal Medicine

## 2016-09-13 ENCOUNTER — Encounter: Payer: Self-pay | Admitting: Internal Medicine

## 2016-09-13 VITALS — BP 150/82 | HR 80 | Temp 98.2°F | Resp 16 | Ht 64.5 in | Wt 199.0 lb

## 2016-09-13 DIAGNOSIS — J069 Acute upper respiratory infection, unspecified: Secondary | ICD-10-CM

## 2016-09-13 DIAGNOSIS — Z1231 Encounter for screening mammogram for malignant neoplasm of breast: Secondary | ICD-10-CM

## 2016-09-13 MED ORDER — PREDNISONE 20 MG PO TABS: ORAL_TABLET | ORAL | 0 refills | Status: DC

## 2016-09-13 MED ORDER — FLUTICASONE PROPIONATE 50 MCG/ACT NA SUSP: 2.0000 | Freq: Every day | NASAL | 0 refills | Status: DC

## 2016-09-13 MED ORDER — PROMETHAZINE-DM 6.25-15 MG/5ML PO SYRP: ORAL_SOLUTION | ORAL | 1 refills | Status: DC

## 2016-09-13 NOTE — Progress Notes (Signed)
HPI  Patient presents to the office for evaluation of scratchy throat and mild cough with post nasal drainage.  It has been going on for 3 days.  Patient reports wet, cough which she feels is worse at nighttime.  They also endorse change in voice, chills, postnasal drip, wheezing and Nasal congestion, sore throat, no ear pain, sinus pressure or headaches.  .  They have tried Warm tea with honey and lemon juice and tylenol.  They report that nothing has worked.  They admits to other sick contacts.  Review of Systems  Constitutional: Positive for malaise/fatigue. Negative for chills and fever.  HENT: Positive for congestion, ear pain, hearing loss and sore throat.   Respiratory: Positive for cough. Negative for sputum production, shortness of breath and wheezing.   Cardiovascular: Negative for chest pain, palpitations and leg swelling.  Neurological: Positive for headaches.    PE:  General:  Alert and non-toxic, WDWN, NAD HEENT: NCAT, PERLA, EOM normal, no occular discharge or erythema.  Nasal mucosal edema with sinus tenderness to palpation.  Oropharynx clear with minimal oropharyngeal edema and erythema.  Mucous membranes moist and pink. Neck:  Cervical adenopathy Chest:  RRR no MRGs.  Lungs clear to auscultation A&P with no wheezes rhonchi or rales.   Abdomen: +BS x 4 quadrants, soft, non-tender, no guarding, rigidity, or rebound. Skin: warm and dry no rash Neuro: A&Ox4, CN II-XII grossly intact  Assessment and Plan:   1. Acute URI -daily antihistamine -if no improvement by Friday consider zpak - predniSONE (DELTASONE) 20 MG tablet; 3 tabs po daily x 3 days, then 2 tabs x 3 days, then 1.5 tabs x 3 days, then 1 tab x 3 days, then 0.5 tabs x 3 days  Dispense: 27 tablet; Refill: 0 - promethazine-dextromethorphan (PROMETHAZINE-DM) 6.25-15 MG/5ML syrup; Take 5-10 mL PO q8hrs prn for cold symptoms  Dispense: 180 mL; Refill: 1 - fluticasone (FLONASE) 50 MCG/ACT nasal spray; Place 2 sprays  into both nostrils daily.  Dispense: 16 g; Refill: 0

## 2016-09-13 NOTE — Patient Instructions (Signed)
Please take the prednisone as prescribed until it is gone.  Please take zyrtec, claritin, or allegra one daily to help dry up congestion.  Please use flonase 2 sprays per nostril before bedtime.  Please take cough syrup up to 3 times per day to help with drainage and coughing.   You can take tylenol as needed for body aches and fatigue.  Please continue to drink plenty of fluids.

## 2016-09-20 ENCOUNTER — Other Ambulatory Visit: Payer: Self-pay | Admitting: Internal Medicine

## 2016-10-04 ENCOUNTER — Other Ambulatory Visit: Payer: Self-pay | Admitting: Internal Medicine

## 2016-10-05 DIAGNOSIS — H2512 Age-related nuclear cataract, left eye: Secondary | ICD-10-CM | POA: Diagnosis not present

## 2016-10-05 DIAGNOSIS — H2513 Age-related nuclear cataract, bilateral: Secondary | ICD-10-CM | POA: Diagnosis not present

## 2016-10-05 DIAGNOSIS — I10 Essential (primary) hypertension: Secondary | ICD-10-CM | POA: Diagnosis not present

## 2016-10-18 DIAGNOSIS — H25011 Cortical age-related cataract, right eye: Secondary | ICD-10-CM | POA: Diagnosis not present

## 2016-10-18 DIAGNOSIS — H2512 Age-related nuclear cataract, left eye: Secondary | ICD-10-CM | POA: Diagnosis not present

## 2016-10-18 DIAGNOSIS — H2511 Age-related nuclear cataract, right eye: Secondary | ICD-10-CM | POA: Diagnosis not present

## 2016-10-19 DIAGNOSIS — H2512 Age-related nuclear cataract, left eye: Secondary | ICD-10-CM | POA: Diagnosis not present

## 2016-10-25 ENCOUNTER — Encounter: Payer: Self-pay | Admitting: Physician Assistant

## 2016-10-25 ENCOUNTER — Ambulatory Visit (INDEPENDENT_AMBULATORY_CARE_PROVIDER_SITE_OTHER): Payer: PPO | Admitting: Physician Assistant

## 2016-10-25 VITALS — BP 132/78 | HR 68 | Temp 97.7°F | Resp 14 | Ht 64.0 in | Wt 197.8 lb

## 2016-10-25 DIAGNOSIS — E559 Vitamin D deficiency, unspecified: Secondary | ICD-10-CM

## 2016-10-25 DIAGNOSIS — Z23 Encounter for immunization: Secondary | ICD-10-CM

## 2016-10-25 DIAGNOSIS — N040 Nephrotic syndrome with minor glomerular abnormality: Secondary | ICD-10-CM

## 2016-10-25 DIAGNOSIS — Z0000 Encounter for general adult medical examination without abnormal findings: Secondary | ICD-10-CM

## 2016-10-25 DIAGNOSIS — N182 Chronic kidney disease, stage 2 (mild): Secondary | ICD-10-CM

## 2016-10-25 DIAGNOSIS — R6889 Other general symptoms and signs: Secondary | ICD-10-CM | POA: Diagnosis not present

## 2016-10-25 DIAGNOSIS — E785 Hyperlipidemia, unspecified: Secondary | ICD-10-CM

## 2016-10-25 DIAGNOSIS — Z0001 Encounter for general adult medical examination with abnormal findings: Secondary | ICD-10-CM

## 2016-10-25 DIAGNOSIS — Z79899 Other long term (current) drug therapy: Secondary | ICD-10-CM | POA: Diagnosis not present

## 2016-10-25 DIAGNOSIS — R7309 Other abnormal glucose: Secondary | ICD-10-CM

## 2016-10-25 DIAGNOSIS — I10 Essential (primary) hypertension: Secondary | ICD-10-CM

## 2016-10-25 LAB — BASIC METABOLIC PANEL WITH GFR
BUN: 10 mg/dL (ref 7–25)
CHLORIDE: 105 mmol/L (ref 98–110)
CO2: 24 mmol/L (ref 20–31)
CREATININE: 0.85 mg/dL (ref 0.50–0.99)
Calcium: 9.4 mg/dL (ref 8.6–10.4)
GFR, Est African American: 83 mL/min (ref >=60)
GFR, Est Non African American: 72 mL/min (ref >=60)
Glucose, Bld: 80 mg/dL (ref 65–99)
Potassium: 4 mmol/L (ref 3.5–5.3)
Sodium: 142 mmol/L (ref 135–146)

## 2016-10-25 LAB — CBC WITH DIFFERENTIAL/PLATELET
BASOS ABS: 78 {cells}/uL (ref 0–200)
Basophils Relative: 1 %
EOS ABS: 234 {cells}/uL (ref 15–500)
Eosinophils Relative: 3 %
HCT: 42.8 % (ref 35.0–45.0)
HEMOGLOBIN: 13.6 g/dL (ref 11.7–15.5)
LYMPHS ABS: 3198 {cells}/uL (ref 850–3900)
Lymphocytes Relative: 41 %
MCH: 26.7 pg — AB (ref 27.0–33.0)
MCHC: 31.8 g/dL — ABNORMAL LOW (ref 32.0–36.0)
MCV: 84.1 fL (ref 80.0–100.0)
MONOS PCT: 10 %
MPV: 9.1 fL (ref 7.5–12.5)
Monocytes Absolute: 780 cells/uL (ref 200–950)
NEUTROS ABS: 3510 {cells}/uL (ref 1500–7800)
NEUTROS PCT: 45 %
Platelets: 278 10*3/uL (ref 140–400)
RBC: 5.09 MIL/uL (ref 3.80–5.10)
RDW: 14.3 % (ref 11.0–15.0)
WBC: 7.8 10*3/uL (ref 3.8–10.8)

## 2016-10-25 LAB — HEPATIC FUNCTION PANEL
ALBUMIN: 4.3 g/dL (ref 3.6–5.1)
ALT: 17 U/L (ref 6–29)
AST: 18 U/L (ref 10–35)
Alkaline Phosphatase: 73 U/L (ref 33–130)
Bilirubin, Direct: 0.1 mg/dL (ref <=0.2)
Indirect Bilirubin: 0.4 mg/dL (ref 0.2–1.2)
TOTAL PROTEIN: 7.6 g/dL (ref 6.1–8.1)
Total Bilirubin: 0.5 mg/dL (ref 0.2–1.2)

## 2016-10-25 LAB — LIPID PANEL
CHOL/HDL RATIO: 3 ratio (ref <5.0)
Cholesterol: 143 mg/dL (ref <200)
HDL: 47 mg/dL — ABNORMAL LOW (ref >50)
LDL Cholesterol: 73 mg/dL (ref <100)
Triglycerides: 113 mg/dL (ref <150)
VLDL: 23 mg/dL (ref <30)

## 2016-10-25 LAB — HEMOGLOBIN A1C
HEMOGLOBIN A1C: 6 % — AB (ref <5.7)
MEAN PLASMA GLUCOSE: 126 mg/dL

## 2016-10-25 LAB — MAGNESIUM: Magnesium: 1.8 mg/dL (ref 1.5–2.5)

## 2016-10-25 MED ORDER — FUROSEMIDE 20 MG PO TABS: 20.0000 mg | ORAL_TABLET | Freq: Every day | ORAL | 1 refills | Status: DC

## 2016-10-25 NOTE — Patient Instructions (Signed)
We want weight loss that will last so you should lose 1-2 pounds a week.  THAT IS IT! Please pick THREE things a month to change. Once it is a habit check off the item. Then pick another three items off the list to become habits.  If you are already doing a habit on the list GREAT!  Cross that item off! o Don't drink your calories. Ie, alcohol, soda, fruit juice, and sweet tea.  o Drink more water. Drink a glass when you feel hungry or before each meal.  o Eat breakfast - Complex carb and protein (likeDannon light and fit yogurt, oatmeal, fruit, eggs, turkey bacon). o Measure your cereal.  Eat no more than one cup a day. (ie Kashi) o Eat an apple a day. o Add a vegetable a day. o Try a new vegetable a month. o Use Pam! Stop using oil or butter to cook. o Don't finish your plate or use smaller plates. o Share your dessert. o Eat sugar free Jello for dessert or frozen grapes. o Don't eat 2-3 hours before bed. o Switch to whole wheat bread, pasta, and brown rice. o Make healthier choices when you eat out. No fries! o Pick baked chicken, NOT fried. o Don't forget to SLOW DOWN when you eat. It is not going anywhere.  o Take the stairs. o Park far away in the parking lot o Lift soup cans (or weights) for 10 minutes while watching TV. o Walk at work for 10 minutes during break. o Walk outside 1 time a week with your friend, kids, dog, or significant other. o Start a walking group at church. o Walk the mall as much as you can tolerate.  o Keep a food diary. o Weigh yourself daily. o Walk for 15 minutes 3 days per week. o Cook at home more often and eat out less.  If life happens and you go back to old habits, it is okay.  Just start over. You can do it!   If you experience chest pain, get short of breath, or tired during the exercise, please stop immediately and inform your doctor.   Before you even begin to attack a weight-loss plan, it pays to remember this: You are not fat. You have fat.  Losing weight isn't about blame or shame; it's simply another achievement to accomplish. Dieting is like any other skill-you have to buckle down and work at it. As long as you act in a smart, reasonable way, you'll ultimately get where you want to be. Here are some weight loss pearls for you.  1. It's Not a Diet. It's a Lifestyle Thinking of a diet as something you're on and suffering through only for the short term doesn't work. To shed weight and keep it off, you need to make permanent changes to the way you eat. It's OK to indulge occasionally, of course, but if you cut calories temporarily and then revert to your old way of eating, you'll gain back the weight quicker than you can say yo-yo. Use it to lose it. Research shows that one of the best predictors of long-term weight loss is how many pounds you drop in the first month. For that reason, nutritionists often suggest being stricter for the first two weeks of your new eating strategy to build momentum. Cut out added sugar and alcohol and avoid unrefined carbs. After that, figure out how you can reincorporate them in a way that's healthy and maintainable.  2. There's a Right   Way to Exercise Working out burns calories and fat and boosts your metabolism by building muscle. But those trying to lose weight are notorious for overestimating the number of calories they burn and underestimating the amount they take in. Unfortunately, your system is biologically programmed to hold on to extra pounds and that means when you start exercising, your body senses the deficit and ramps up its hunger signals. If you're not diligent, you'll eat everything you burn and then some. Use it to lose it. Cardio gets all the exercise glory, but strength and interval training are the real heroes. They help you build lean muscle, which in turn increases your metabolism and calorie-burning ability 3. Don't Overreact to Mild Hunger Some people have a hard time losing weight because  of hunger anxiety. To them, being hungry is bad-something to be avoided at all costs-so they carry snacks with them and eat when they don't need to. Others eat because they're stressed out or bored. While you never want to get to the point of being ravenous (that's when bingeing is likely to happen), a hunger pang, a craving, or the fact that it's 3:00 p.m. should not send you racing for the vending machine or obsessing about the energy bar in your purse. Ideally, you should put off eating until your stomach is growling and it's difficult to concentrate.  Use it to lose it. When you feel the urge to eat, use the HALT method. Ask yourself, Am I really hungry? Or am I angry or anxious, lonely or bored, or tired? If you're still not certain, try the apple test. If you're truly hungry, an apple should seem delicious; if it doesn't, something else is going on. Or you can try drinking water and making yourself busy, if you are still hungry try a healthy snack.  4. Not All Calories Are Created Equal The mechanics of weight loss are pretty simple: Take in fewer calories than you use for energy. But the kind of food you eat makes all the difference. Processed food that's high in saturated fat and refined starch or sugar can cause inflammation that disrupts the hormone signals that tell your brain you're full. The result: You eat a lot more.  Use it to lose it. Clean up your diet. Swap in whole, unprocessed foods, including vegetables, lean protein, and healthy fats that will fill you up and give you the biggest nutritional bang for your calorie buck. In a few weeks, as your brain starts receiving regular hunger and fullness signals once again, you'll notice that you feel less hungry overall and naturally start cutting back on the amount you eat.  5. Protein, Produce, and Plant-Based Fats Are Your Weight-Loss Trinity Here's why eating the three Ps regularly will help you drop pounds. Protein fills you up. You need it  to build lean muscle, which keeps your metabolism humming so that you can torch more fat. People in a weight-loss program who ate double the recommended daily allowance for protein (about 110 grams for a 150-pound woman) lost 70 percent of their weight from fat, while people who ate the RDA lost only about 40 percent, one study found. Produce is packed with filling fiber. "It's very difficult to consume too many calories if you're eating a lot of vegetables. Example: Three cups of broccoli is a lot of food, yet only 93 calories. (Fruit is another story. It can be easy to overeat and can contain a lot of calories from sugar, so be sure to   monitor your intake.) Plant-based fats like olive oil and those in avocados and nuts are healthy and extra satiating.  Use it to lose it. Aim to incorporate each of the three Ps into every meal and snack. People who eat protein throughout the day are able to keep weight off, according to a study in the American Journal of Clinical Nutrition. In addition to meat, poultry and seafood, good sources are beans, lentils, eggs, tofu, and yogurt. As for fat, keep portion sizes in check by measuring out salad dressing, oil, and nut butters (shoot for one to two tablespoons). Finally, eat veggies or a little fruit at every meal. People who did that consumed 308 fewer calories but didn't feel any hungrier than when they didn't eat more produce.  7. How You Eat Is As Important As What You Eat In order for your brain to register that you're full, you need to focus on what you're eating. Sit down whenever you eat, preferably at a table. Turn off the TV or computer, put down your phone, and look at your food. Smell it. Chew slowly, and don't put another bite on your fork until you swallow. When women ate lunch this attentively, they consumed 30 percent less when snacking later than those who listened to an audiobook at lunchtime, according to a study in the British Journal of Nutrition. 8.  Weighing Yourself Really Works The scale provides the best evidence about whether your efforts are paying off. Seeing the numbers tick up or down or stagnate is motivation to keep going-or to rethink your approach. A 2015 study at [employer] found that daily weigh-ins helped people lose more weight, keep it off, and maintain that loss, even after two years. Use it to lose it. Step on the scale at the same time every day for the best results. If your weight shoots up several pounds from one weigh-in to the next, don't freak out. Eating a lot of salt the night before or having your period is the likely culprit. The number should return to normal in a day or two. It's a steady climb that you need to do something about. 9. Too Much Stress and Too Little Sleep Are Your Enemies When you're tired and frazzled, your body cranks up the production of cortisol, the stress hormone that can cause carb cravings. Not getting enough sleep also boosts your levels of ghrelin, a hormone associated with hunger, while suppressing leptin, a hormone that signals fullness and satiety. People on a diet who slept only five and a half hours a night for two weeks lost 55 percent less fat and were hungrier than those who slept eight and a half hours, according to a study in the [employer] Association Journal. Use it to lose it. Prioritize sleep, aiming for seven hours or more a night, which research shows helps lower stress. And make sure you're getting quality zzz's. If a snoring spouse or a fidgety cat wakes you up frequently throughout the night, you may end up getting the equivalent of just four hours of sleep, according to a study from [employer]. Keep pets out of the bedroom, and use a white-noise app to drown out snoring. 10. You Will Hit a plateau-And You Can Bust Through It As you slim down, your body releases much less leptin, the fullness hormone.  If you're not strength training, start right now.  Building muscle can raise your metabolism to help you overcome a plateau. To keep your body challenged   and burning calories, incorporate new moves and more intense intervals into your workouts or add another sweat session to your weekly routine. Alternatively, cut an extra 100 calories or so a day from your diet. Now that you've lost weight, your body simply doesn't need as much fuel.   

## 2016-10-25 NOTE — Progress Notes (Signed)
WELCOME TO MEDICARE VISIT AND 3 MONTH FOLLOW UP  Assessment:    1. Essential hypertension - continue medications, DASH diet, exercise and monitor at home. Call if greater than 130/80.  - CBC with Differential/Platelet - BASIC METABOLIC PANEL WITH GFR - Hepatic function panel - TSH  2. Abnormal glucose Discussed general issues about diabetes pathophysiology and management., Educational material distributed., Suggested low cholesterol diet., Encouraged aerobic exercise., Discussed foot care., Reminded to get yearly retinal exam. - Hemoglobin A1c  3. SYNDROME, NEPHROTIC W/MINIMAL CHANGE LESION Monitor kidney function - BASIC METABOLIC PANEL WITH GFR  4. Chronic kidney disease (CKD) stage 2 Monitor kidney function, no NSAIDS - BASIC METABOLIC PANEL WITH GFR  5. Hyperlipidemia, unspecified hyperlipidemia type -continue medications, check lipids, decrease fatty foods, increase activity.  - Lipid panel  6. Morbid obesity (32.60) - long discussion about weight loss, diet, and exercise  7. Vitamin D deficiency  8. Medication management - Magnesium  9. Encounter for Medicare annual wellness exam   Over 30 minutes of exam, counseling, chart review, and critical decision making was performed Future Appointments Date Time Provider Hardy  02/14/2017 3:00 PM Unk Pinto, MD [facility] None     Plan:   During the course of the visit the patient was educated and counseled about appropriate screening and preventive services including:    Pneumococcal vaccine   Influenza vaccine  Td vaccine  Prevnar 13  Screening electrocardiogram  Screening mammography  Bone densitometry screening  Colorectal cancer screening  Diabetes screening  Glaucoma screening  Nutrition counseling   Advanced directives: given info/requested copies   Subjective:   Lydia Edwards is a 65 y.o. female who presents for Medicare Annual Wellness Visit and 3 month follow up on  hypertension, prediabetes, hyperlipidemia, vitamin D def.   Her blood pressure has been controlled at home, today their BP is BP: 132/78 She does workout.  She does a lot of walking.  She walks at least a mile daily. She denies chest pain, shortness of breath, dizziness.   She is on cholesterol medication and denies myalgias. Her cholesterol is at goal. The cholesterol last visit was:   Lab Results  Component Value Date   CHOL 121 (L) 07/19/2016   HDL 42 (L) 07/19/2016   LDLCALC 63 07/19/2016   LDLDIRECT 160.1 07/18/2009   TRIG 80 07/19/2016   CHOLHDL 2.9 07/19/2016   She has been working on diet and exercise for prediabetes, and denies foot ulcerations, hyperglycemia, hypoglycemia , increased appetite, nausea, paresthesia of the feet, polydipsia, polyuria, visual disturbances, vomiting and weight loss. Last A1C in the office was:  Lab Results  Component Value Date   HGBA1C 5.8 (H) 07/19/2016   Patient is on Vitamin D supplement. Lab Results  Component Value Date   VD25OH 34 07/19/2016     BMI is Body mass index is 33.95 kg/m., she is working on diet and exercise. Wt Readings from Last 3 Encounters:  10/25/16 197 lb 12.8 oz (89.7 kg)  09/13/16 199 lb (90.3 kg)  07/19/16 192 lb 6.4 oz (87.3 kg)   She had an episode of nephrotic syndrome 1 year ago with a pneumonia, saw Dr. Lorrene Reid for that, has been discharged and we are still monitoring kidney functions.  Lab Results  Component Value Date   GFRAA 80 07/19/2016    Medication Review Current Outpatient Prescriptions on File Prior to Visit  Medication Sig Dispense Refill  . acetaminophen (TYLENOL) 500 MG tablet Take 500 mg by mouth every  6 (six) hours as needed for mild pain or headache.     Marland Kitchen aspirin EC 81 MG tablet Take 81 mg by mouth daily.    Marland Kitchen atorvastatin (LIPITOR) 40 MG tablet TAKE ONE TABLET BY MOUTH ONCE DAILY 90 tablet 0  . cyclobenzaprine (FLEXERIL) 10 MG tablet Take 1 tablet (10 mg total) by mouth every 8 (eight)  hours as needed for muscle spasms. 30 tablet 1  . Esomeprazole Magnesium (NEXIUM PO) Take 1 capsule by mouth daily.    . fluticasone (FLONASE) 50 MCG/ACT nasal spray Place 2 sprays into both nostrils daily. 16 g 0  . furosemide (LASIX) 20 MG tablet Take 1 tablet (20 mg total) by mouth daily. 30 tablet 11  . potassium chloride SA (K-DUR,KLOR-CON) 20 MEQ tablet Take 1 tablet daily 90 tablet 1   No current facility-administered medications on file prior to visit.     Current Problems (verified) Patient Active Problem List   Diagnosis Date Noted  . Other abnormal glucose 10/25/2016  . Chronic kidney disease (CKD) stage 2 07/19/2016  . Encounter for Medicare annual wellness exam 10/06/2015  . Morbid obesity (32.60) 06/16/2015  . Medication management 09/02/2014  . Hyperlipidemia   . Hypertension   . Vitamin D deficiency   . SYNDROME, NEPHROTIC W/MINIMAL CHANGE LESION 07/12/2007  . COLONOSCOPY, HX OF 07/11/2007    Screening Tests Immunization History  Administered Date(s) Administered  . Influenza,trivalent, recombinat, inj, PF 09/07/2015  . Influenza-Unspecified 08/30/2015  . Pneumococcal Polysaccharide-23 10/29/2015  . Td 02/19/2000    Preventative care: Last colonoscopy: 08/2015 Last mammogram: 08/2016 Last pap smear/pelvic exam:   declines DEXA: 05/2016  Prior vaccinations: TD or Tdap: Patient is due for tetanus  But just wants flu shot today  Influenza: DUE TODAY Pneumococcal: 2016 Prevnar13: Due but does not want with flu shot today Shingles/Zostavax: Due  Names of Other Physician/Practitioners you currently use: 1. [facility]- here for primary care 2. Dr. Talbert Forest, eye doctor, last visit 6-8 weeks ago for cataract surgery [facility]. [facility], last visit 2 years ago Patient Care Team: Unk Pinto, MD as PCP - General (Internal Medicine) Vania Rea, MD as Consulting Physician (Obstetrics and Gynecology) Lafayette Dragon, MD  (Inactive) as Consulting Physician (Gastroenterology)  Allergies Allergies  Allergen Reactions  . Ace Inhibitors   . Naproxen     REACTION: renal failure  . Norvasc [Amlodipine Besylate]     edema  . Nsaids     SURGICAL HISTORY She  has a past surgical history that includes Abdominal hysterectomy and Eye surgery (Left, 2017). FAMILY HISTORY Her family history includes Cancer in her sister; Heart attack in her brother; Hypertension in her sister; Stroke in her sister. SOCIAL HISTORY She  reports that she has never smoked. She has never used smokeless tobacco. She reports that she does not drink alcohol or use drugs.  MEDICARE WELLNESS OBJECTIVES: Physical activity: Current Exercise Habits: Home exercise routine, Type of exercise: walking, Time (Minutes): 30, Intensity: Mild Cardiac risk factors: Cardiac Risk Factors include: dyslipidemia;hypertension;sedentary lifestyle;obesity (BMI >30kg/m2) Depression/mood screen:   Depression screen [facility] 2/9 10/25/2016  Decreased Interest 0  Down, Depressed, Hopeless 0  PHQ - 2 Score 0    ADLs:  In your present state of health, do you have any difficulty performing the following activities: 10/25/2016 01/07/2016  Hearing? N N  Vision? N N  Difficulty concentrating or making decisions? N N  Walking or climbing stairs? N N  Dressing or bathing?  N N  Doing errands, shopping? N N  Preparing Food and eating ? - -  Using the Toilet? - -  In the past six months, have you accidently leaked urine? - -  Do you have problems with loss of bowel control? - -  Managing your Medications? - -  Managing your Finances? - -  Housekeeping or managing your Housekeeping? - -  Some recent data might be hidden     Cognitive Testing  Alert? Yes  Normal Appearance?Yes  Oriented to person? Yes  Place? Yes   Time? Yes  Recall of three objects?  Yes  Can perform simple calculations? Yes  Displays appropriate judgment?Yes  Can read the correct time from a  watch face?Yes  EOL planning: Does Patient Have a Medical Advance Directive?: No Would patient like information on creating a medical advance directive?: Yes (MAU/Ambulatory/Procedural Areas - Information given)   Review of Systems  Constitutional: Negative for chills, fever, malaise/fatigue and weight loss.  HENT: Negative for congestion, ear pain and sore throat.   Eyes: Negative.   Respiratory: Negative for cough, shortness of breath and wheezing.   Cardiovascular: Negative for chest pain, palpitations and leg swelling.  Gastrointestinal: Negative for blood in stool, constipation, diarrhea, heartburn and melena.  Genitourinary: Negative for dysuria, frequency, hematuria and urgency.  Skin: Negative.   Neurological: Negative for dizziness, sensory change, loss of consciousness and headaches.  Psychiatric/Behavioral: Negative for depression. The patient is not nervous/anxious and does not have insomnia.     Objective:   Today's Vitals   10/25/16 1425  BP: 132/78  Pulse: 68  Resp: 14  Temp: 97.7 F (36.5 C)  TempSrc: Temporal  Weight: 197 lb 12.8 oz (89.7 kg)  Height: 5\' 4"  (1.626 m)  PainSc: 0-No pain   Body mass index is 33.95 kg/m.  General appearance: alert, no distress, WD/WN,  female HEENT: normocephalic, sclerae anicteric, TMs pearly, nares patent, no discharge or erythema, pharynx normal Oral cavity: MMM, no lesions Neck: supple, no lymphadenopathy, no thyromegaly, no masses Heart: RRR, normal S1, S2, no murmurs Lungs: CTA bilaterally, no wheezes, rhonchi, or rales Abdomen: +bs, soft, non tender, non distended, no masses, no hepatomegaly, no splenomegaly Musculoskeletal: nontender, no swelling, no obvious deformity Extremities: no edema, no cyanosis, no clubbing Pulses: 2+ symmetric, upper and lower extremities, normal cap refill Neurological: alert, oriented x 3, CN2-12 intact, strength normal upper extremities and lower extremities, sensation normal throughout,  DTRs 2+ throughout, no cerebellar signs, gait normal Psychiatric: normal affect, behavior normal, pleasant  Breast: defer Gyn: defer Rectal: defer   Medicare Attestation I have personally reviewed: The patient's medical and social history Their use of alcohol, tobacco or illicit drugs Their current medications and supplements The patient's functional ability including ADLs,fall risks, home safety risks, cognitive, and hearing and visual impairment Diet and physical activities Evidence for depression or mood disorders  The patient's weight, height, BMI, and visual acuity have been recorded in the chart.  I have made referrals, counseling, and provided education to the patient based on review of the above and I have provided the patient with a written personalized care plan for preventive services.     Vicie Mutters, PA-C   10/25/2016

## 2016-10-26 LAB — TSH: TSH: 1.43 m[IU]/L

## 2016-10-30 ENCOUNTER — Encounter: Payer: Self-pay | Admitting: *Deleted

## 2016-11-27 ENCOUNTER — Other Ambulatory Visit: Payer: Self-pay | Admitting: Physician Assistant

## 2017-02-14 ENCOUNTER — Other Ambulatory Visit: Payer: Self-pay | Admitting: Internal Medicine

## 2017-02-14 ENCOUNTER — Ambulatory Visit (INDEPENDENT_AMBULATORY_CARE_PROVIDER_SITE_OTHER): Payer: PPO | Admitting: Internal Medicine

## 2017-02-14 ENCOUNTER — Other Ambulatory Visit: Payer: Self-pay | Admitting: *Deleted

## 2017-02-14 ENCOUNTER — Encounter: Payer: Self-pay | Admitting: Internal Medicine

## 2017-02-14 VITALS — BP 136/84 | HR 72 | Temp 97.7°F | Resp 16 | Ht 63.0 in | Wt 194.0 lb

## 2017-02-14 DIAGNOSIS — Z0001 Encounter for general adult medical examination with abnormal findings: Secondary | ICD-10-CM

## 2017-02-14 DIAGNOSIS — N182 Chronic kidney disease, stage 2 (mild): Secondary | ICD-10-CM

## 2017-02-14 DIAGNOSIS — Z1212 Encounter for screening for malignant neoplasm of rectum: Secondary | ICD-10-CM

## 2017-02-14 DIAGNOSIS — Z0000 Encounter for general adult medical examination without abnormal findings: Secondary | ICD-10-CM | POA: Diagnosis not present

## 2017-02-14 DIAGNOSIS — I10 Essential (primary) hypertension: Secondary | ICD-10-CM | POA: Diagnosis not present

## 2017-02-14 DIAGNOSIS — Z79899 Other long term (current) drug therapy: Secondary | ICD-10-CM

## 2017-02-14 DIAGNOSIS — E782 Mixed hyperlipidemia: Secondary | ICD-10-CM

## 2017-02-14 DIAGNOSIS — R7303 Prediabetes: Secondary | ICD-10-CM | POA: Diagnosis not present

## 2017-02-14 DIAGNOSIS — J0410 Acute tracheitis without obstruction: Secondary | ICD-10-CM

## 2017-02-14 DIAGNOSIS — N390 Urinary tract infection, site not specified: Secondary | ICD-10-CM | POA: Diagnosis not present

## 2017-02-14 DIAGNOSIS — Z136 Encounter for screening for cardiovascular disorders: Secondary | ICD-10-CM | POA: Diagnosis not present

## 2017-02-14 DIAGNOSIS — E559 Vitamin D deficiency, unspecified: Secondary | ICD-10-CM

## 2017-02-14 DIAGNOSIS — K219 Gastro-esophageal reflux disease without esophagitis: Secondary | ICD-10-CM

## 2017-02-14 LAB — CBC WITH DIFFERENTIAL/PLATELET
BASOS ABS: 71 {cells}/uL (ref 0–200)
BASOS PCT: 1 %
EOS ABS: 426 {cells}/uL (ref 15–500)
Eosinophils Relative: 6 %
HEMATOCRIT: 42.1 % (ref 35.0–45.0)
HEMOGLOBIN: 13.1 g/dL (ref 11.7–15.5)
LYMPHS ABS: 2911 {cells}/uL (ref 850–3900)
Lymphocytes Relative: 41 %
MCH: 26.5 pg — AB (ref 27.0–33.0)
MCHC: 31.1 g/dL — ABNORMAL LOW (ref 32.0–36.0)
MCV: 85.1 fL (ref 80.0–100.0)
MONO ABS: 710 {cells}/uL (ref 200–950)
MONOS PCT: 10 %
MPV: 9.3 fL (ref 7.5–12.5)
NEUTROS ABS: 2982 {cells}/uL (ref 1500–7800)
Neutrophils Relative %: 42 %
PLATELETS: 254 10*3/uL (ref 140–400)
RBC: 4.95 MIL/uL (ref 3.80–5.10)
RDW: 14 % (ref 11.0–15.0)
WBC: 7.1 10*3/uL (ref 3.8–10.8)

## 2017-02-14 LAB — BASIC METABOLIC PANEL WITH GFR
BUN: 10 mg/dL (ref 7–25)
CHLORIDE: 106 mmol/L (ref 98–110)
CO2: 29 mmol/L (ref 20–31)
CREATININE: 0.91 mg/dL (ref 0.50–0.99)
Calcium: 9.5 mg/dL (ref 8.6–10.4)
GFR, Est African American: 76 mL/min (ref >=60)
GFR, Est Non African American: 66 mL/min (ref >=60)
GLUCOSE: 94 mg/dL (ref 65–99)
POTASSIUM: 4.4 mmol/L (ref 3.5–5.3)
Sodium: 143 mmol/L (ref 135–146)

## 2017-02-14 LAB — HEPATIC FUNCTION PANEL
ALBUMIN: 4.2 g/dL (ref 3.6–5.1)
ALK PHOS: 79 U/L (ref 33–130)
ALT: 14 U/L (ref 6–29)
AST: 17 U/L (ref 10–35)
Bilirubin, Direct: 0.1 mg/dL (ref <=0.2)
Indirect Bilirubin: 0.4 mg/dL (ref 0.2–1.2)
TOTAL PROTEIN: 7.2 g/dL (ref 6.1–8.1)
Total Bilirubin: 0.5 mg/dL (ref 0.2–1.2)

## 2017-02-14 LAB — TSH: TSH: 1.15 mIU/L

## 2017-02-14 LAB — MAGNESIUM: Magnesium: 1.8 mg/dL (ref 1.5–2.5)

## 2017-02-14 LAB — LIPID PANEL
CHOLESTEROL: 180 mg/dL (ref <200)
HDL: 40 mg/dL — ABNORMAL LOW (ref >50)
LDL Cholesterol: 119 mg/dL — ABNORMAL HIGH (ref <100)
TRIGLYCERIDES: 103 mg/dL (ref <150)
Total CHOL/HDL Ratio: 4.5 Ratio (ref <5.0)
VLDL: 21 mg/dL (ref <30)

## 2017-02-14 MED ORDER — AZITHROMYCIN 250 MG PO TABS: ORAL_TABLET | ORAL | 1 refills | Status: DC

## 2017-02-14 MED ORDER — ATORVASTATIN CALCIUM 40 MG PO TABS: 40.0000 mg | ORAL_TABLET | Freq: Every day | ORAL | 1 refills | Status: DC

## 2017-02-14 MED ORDER — PREDNISONE 20 MG PO TABS: ORAL_TABLET | ORAL | 0 refills | Status: DC

## 2017-02-14 MED ORDER — PROMETHAZINE-DM 6.25-15 MG/5ML PO SYRP: ORAL_SOLUTION | ORAL | 1 refills | Status: DC

## 2017-02-14 NOTE — Patient Instructions (Signed)

## 2017-02-14 NOTE — Progress Notes (Signed)
[facility] Unk Pinto, M.D.    [name], [name]      Starlyn Skeans, [name]  [facility]                [address] [place], [place] [address] Telephone [phone] Telefax [phone]  Annual Screening/Preventative Visit & Comprehensive Evaluation &  Examination     This very nice 66 y.o. [name] presents for a Screening/Preventative Visit & comprehensive evaluation and management of multiple medical co-morbidities.  Patient has been followed for HTN, Prediabetes, Hyperlipidemia and Vitamin D Deficiency.      HTN predates since 2002. Patient's BP has been controlled at home and patient denies any cardiac symptoms as chest pain, palpitations, shortness of breath, dizziness or ankle swelling. Today's BP is near goal - 136/84      In 2011, she was hospitalized with ARF attributed to NSAID use and recovered. Then in 2016, she was hospitalized with CAP and severe dehydration and requiring dialysis and renal functions recovered to her baseline CKD2 (GFR 83 ml/min).       Patient's hyperlipidemia is controlled with diet and medications. Patient denies myalgias or other medication SE's. Last lipids were at goal: Lab Results  Component Value Date   CHOL 143 10/25/2016   HDL 47 (L) 10/25/2016   LDLCALC 73 10/25/2016   LDLDIRECT 160.1 07/18/2009   TRIG 113 10/25/2016   CHOLHDL 3.0 10/25/2016      Patient has prediabetes predating (A1c 6.2% in 2012). A1c have fluctuated over the years and up to 6.7%. She denies reactive hypoglycemic symptoms, visual blurring, diabetic polys, or paresthesias. Last A1c was still not at goal: Lab Results  Component Value Date   HGBA1C 6.0 (H) 10/25/2016      Finally, patient has history of Vitamin D Deficiency ("12" in 2012) and last Vitamin D was still very low (goal 70-100): Lab Results  Component Value Date   VD25OH 34 07/19/2016   Current Outpatient  Prescriptions on File Prior to Visit  Medication Sig  . acetaminophen (TYLENOL) 500 MG tablet Take 500 mg by mouth every 6 (six) hours as needed for mild pain or headache.   Marland Kitchen aspirin EC 81 MG tablet Take 81 mg by mouth daily.  . cyclobenzaprine (FLEXERIL) 10 MG tablet Take 1 tablet (10 mg total) by mouth every 8 (eight) hours as needed for muscle spasms.  . Esomeprazole Magnesium (NEXIUM PO) Take 1 capsule by mouth daily.  . fluticasone (FLONASE) 50 MCG/ACT nasal spray Place 2 sprays into both nostrils daily.  . furosemide (LASIX) 20 MG tablet TAKE 1 TABLET BY MOUTH DAILY  . potassium chloride SA (K-DUR,KLOR-CON) 20 MEQ tablet Take 1 tablet daily   No current facility-administered medications on file prior to visit.    Allergies  Allergen Reactions  . Ace Inhibitors   . Naproxen     REACTION: renal failure  . Norvasc [Amlodipine Besylate]     edema  . Nsaids    Past Medical History:  Diagnosis Date  . Hyperlipidemia   . Hypertension   . Prediabetes   . Vitamin D deficiency    Health Maintenance  Topic Date Due  . PNA vac Low Risk Adult (2 of 2 - PCV13) 10/28/2016  . URINE MICROALBUMIN  01/06/2017  . TETANUS/TDAP  09/22/2017 (Originally 02/18/2010)  . MAMMOGRAM  09/13/2018  . COLONOSCOPY  09/09/2025  .  INFLUENZA VACCINE  Completed  . DEXA SCAN  Completed  . Hepatitis C Screening  Completed   Immunization History  Administered Date(s) Administered  . Influenza,inj,quad, With Preservative 10/25/2016  . Influenza,trivalent, recombinat, inj, PF 09/07/2015  . Influenza-Unspecified 08/30/2015  . Pneumococcal Polysaccharide-23 10/29/2015  . Td 02/19/2000   Past Surgical History:  Procedure Laterality Date  . ABDOMINAL HYSTERECTOMY    . EYE SURGERY Left 2017   Cataract sugery left eye   Family History  Problem Relation Age of Onset  . Stroke Sister   . Hypertension Sister   . Cancer Sister   . Heart attack Brother   . Colon cancer Neg Hx    Social History   Substance Use Topics  . Smoking status: Never Smoker  . Smokeless tobacco: Never Used  . Alcohol use No    ROS Constitutional: Denies fever, chills, weight loss/gain, headaches, insomnia,  night sweats, and change in appetite. Does c/o fatigue. Eyes: Denies redness, blurred vision, diplopia, discharge, itchy, watery eyes.  ENT: Denies discharge, post nasal drip, epistaxis, sore throat, earache, hearing loss, dental pain, Tinnitus, Vertigo, Sinus pain, snoring. C/o head congestion. Cardio: Denies chest pain, palpitations, irregular heartbeat, syncope, dyspnea, diaphoresis, orthopnea, PND, claudication, edema Respiratory: denies  dyspnea, DOE, pleurisy, hoarseness, laryngitis, wheezing. C/o cough w/ occasional scant yellow sputum. Gastrointestinal: Denies dysphagia, heartburn, reflux, water brash, pain, cramps, nausea, vomiting, bloating, diarrhea, constipation, hematemesis, melena, hematochezia, jaundice, hemorrhoids Genitourinary: Denies dysuria, frequency, urgency, nocturia, hesitancy, discharge, hematuria, flank pain Breast: Breast lumps, nipple discharge, bleeding.  Musculoskeletal: Denies arthralgia, myalgia, stiffness, Jt. Swelling, pain, limp, and strain/sprain. Denies falls. Skin: Denies puritis, rash, hives, warts, acne, eczema, changing in skin lesion Neuro: No weakness, tremor, incoordination, spasms, paresthesia, pain Psychiatric: Denies confusion, memory loss, sensory loss. Denies Depression. Endocrine: Denies change in weight, skin, hair change, nocturia, and paresthesia, diabetic polys, visual blurring, hyper / hypo glycemic episodes.  Heme/Lymph: No excessive bleeding, bruising, enlarged lymph nodes.  Physical Exam  BP 136/84   Pulse 72   Temp 97.7 F (36.5 C)   Resp 16   Ht 5\' 3"  (1.6 m)   Wt 194 lb (88 kg)   BMI 34.37 kg/m   General Appearance: Well nourished and in no apparent distress. Sl hoarse. Brassy congested cough.   Eyes: PERRLA, EOMs, conjunctiva no  swelling or erythema, normal fundi and vessels. Sinuses: Slight frontal/maxillary tenderness ENT/Mouth: EACs patent / TMs  nl. Nares clear without erythema, swelling, mucoid exudates. Oral hygiene is good. No erythema, swelling, or exudate. Tongue normal, non-obstructing. Tonsils not swollen or erythematous. Hearing normal.  Neck: Supple, thyroid normal. No bruits, nodes or JVD. Respiratory: Respiratory effort normal.  BS equal bilateral with few rales & rhonci, but no  wheezing or stridor. Cardio: Heart sounds are normal with regular rate and rhythm and no murmurs, rubs or gallops. Peripheral pulses are normal and equal bilaterally without edema. No aortic or femoral bruits. Chest: symmetric with normal excursions and percussion. Breasts: Symmetric, without lumps, nipple discharge, retractions, or fibrocystic changes.  Abdomen: Flat, soft with bowel sounds active. Nontender, no guarding, rebound, hernias, masses, or organomegaly.  Lymphatics: Non tender without lymphadenopathy.  Genitourinary:  Musculoskeletal: Full ROM all peripheral extremities, joint stability, 5/5 strength, and normal gait. Skin: Warm and dry without rashes, lesions, cyanosis, clubbing or  ecchymosis.  Neuro: Cranial nerves intact, reflexes equal bilaterally. Normal muscle tone, no cerebellar symptoms. Sensation intact.  Pysch: Alert and oriented X 3, normal affect, Insight and Judgment appropriate.  Assessment and Plan  1. Annual Preventative Screening Examination   2. Essential hypertension  - Microalbumin / creatinine urine ratio - EKG 12-Lead - Urinalysis, Routine w reflex microscopic - CBC with Differential/Platelet - BASIC METABOLIC PANEL WITH GFR - Magnesium - TSH  3. Mixed hyperlipidemia  - EKG 12-Lead - Hepatic function panel - Lipid panel - TSH  4. Prediabetes  - EKG 12-Lead - Hemoglobin A1c - Insulin, random  5. Vitamin D deficiency  - VITAMIN D 25 Hydroxy  6. Chronic kidney disease  (CKD) stage 2  - Microalbumin / creatinine urine ratio  7. Gastroesophageal reflux disease   8. Screening for rectal cancer  - POC Hemoccult Bld/Stl   9. Screening for ischemic heart disease  - EKG 12-Lead  10. Medication management  - Urinalysis, Routine w reflex microscopic - CBC with Differential/Platelet - BASIC METABOLIC PANEL WITH GFR - Hepatic function panel - Magnesium - Lipid panel - TSH - Hemoglobin A1c - Insulin, random - VITAMIN D 25 Hydroxy   11. Tracheitis  - predniSONE (DELTASONE) 20 MG tablet; 1 tab 3 x day for 3 days, then 1 tab 2 x day for 3 days, then 1 tab 1 x day for 5 days  Dispense: 20 tablet; Refill: 0  - azithromycin (ZITHROMAX) 250 MG tablet; Take 2 tablets (500 mg) on  Day 1,  followed by 1 tablet (250 mg) once daily on Days 2 through 5.  Dispense: 6 each; Refill: 1  - promethazine-dextromethorphan (PROMETHAZINE-DM) 6.25-15 MG/5ML syrup; Take 1 to 2 tsp enery 4 hours if needed for cough  Dispense: 360 mL; Refill: 1       Continue prudent diet as discussed, weight control, BP monitoring, regular exercise, and medications. Discussed med's effects and SE's. Screening labs and tests as requested with regular follow-up as recommended. Over 40 minutes of exam, counseling, chart review and high complex critical decision making was performed.

## 2017-02-15 LAB — URINALYSIS, ROUTINE W REFLEX MICROSCOPIC
Bilirubin Urine: NEGATIVE
GLUCOSE, UA: NEGATIVE
Ketones, ur: NEGATIVE
Nitrite: POSITIVE — AB
Protein, ur: NEGATIVE
SPECIFIC GRAVITY, URINE: 1.017 (ref 1.001–1.035)
pH: 5.5 (ref 5.0–8.0)

## 2017-02-15 LAB — URINALYSIS, MICROSCOPIC ONLY
Casts: NONE SEEN [LPF]
Crystals: NONE SEEN [HPF]
Yeast: NONE SEEN [HPF]

## 2017-02-15 LAB — HEMOGLOBIN A1C
Hgb A1c MFr Bld: 5.9 % — ABNORMAL HIGH (ref <5.7)
MEAN PLASMA GLUCOSE: 123 mg/dL

## 2017-02-15 LAB — VITAMIN D 25 HYDROXY (VIT D DEFICIENCY, FRACTURES): Vit D, 25-Hydroxy: 58 ng/mL (ref 30–100)

## 2017-02-15 LAB — MICROALBUMIN / CREATININE URINE RATIO
Creatinine, Urine: 178 mg/dL (ref 20–320)
Microalb Creat Ratio: 6 mcg/mg creat (ref <30)
Microalb, Ur: 1.1 mg/dL

## 2017-02-15 LAB — INSULIN, RANDOM: Insulin: 19.8 u[IU]/mL — ABNORMAL HIGH (ref 2.0–19.6)

## 2017-02-17 ENCOUNTER — Other Ambulatory Visit: Payer: Self-pay | Admitting: Internal Medicine

## 2017-02-17 DIAGNOSIS — N3000 Acute cystitis without hematuria: Secondary | ICD-10-CM

## 2017-02-17 LAB — URINE CULTURE

## 2017-02-17 MED ORDER — CIPROFLOXACIN HCL 250 MG PO TABS: ORAL_TABLET | ORAL | 0 refills | Status: AC

## 2017-02-21 DIAGNOSIS — H2511 Age-related nuclear cataract, right eye: Secondary | ICD-10-CM | POA: Diagnosis not present

## 2017-02-21 DIAGNOSIS — H11423 Conjunctival edema, bilateral: Secondary | ICD-10-CM | POA: Diagnosis not present

## 2017-02-21 DIAGNOSIS — Z9842 Cataract extraction status, left eye: Secondary | ICD-10-CM | POA: Diagnosis not present

## 2017-02-21 DIAGNOSIS — Z961 Presence of intraocular lens: Secondary | ICD-10-CM | POA: Diagnosis not present

## 2017-02-21 DIAGNOSIS — H40023 Open angle with borderline findings, high risk, bilateral: Secondary | ICD-10-CM | POA: Diagnosis not present

## 2017-02-21 DIAGNOSIS — H40003 Preglaucoma, unspecified, bilateral: Secondary | ICD-10-CM | POA: Diagnosis not present

## 2017-02-21 DIAGNOSIS — H04123 Dry eye syndrome of bilateral lacrimal glands: Secondary | ICD-10-CM | POA: Diagnosis not present

## 2017-02-21 DIAGNOSIS — H18413 Arcus senilis, bilateral: Secondary | ICD-10-CM | POA: Diagnosis not present

## 2017-02-21 DIAGNOSIS — H11153 Pinguecula, bilateral: Secondary | ICD-10-CM | POA: Diagnosis not present

## 2017-03-22 ENCOUNTER — Other Ambulatory Visit: Payer: Self-pay | Admitting: *Deleted

## 2017-03-22 DIAGNOSIS — Z1212 Encounter for screening for malignant neoplasm of rectum: Secondary | ICD-10-CM

## 2017-03-22 LAB — POC HEMOCCULT BLD/STL (HOME/3-CARD/SCREEN)
Card #2 Fecal Occult Blod, POC: NEGATIVE
FECAL OCCULT BLD: NEGATIVE
FECAL OCCULT BLD: NEGATIVE

## 2017-04-28 ENCOUNTER — Other Ambulatory Visit: Payer: Self-pay | Admitting: Internal Medicine

## 2017-05-30 ENCOUNTER — Ambulatory Visit: Payer: Self-pay | Admitting: Internal Medicine

## 2017-06-09 ENCOUNTER — Other Ambulatory Visit: Payer: Self-pay | Admitting: Physician Assistant

## 2017-07-16 NOTE — Progress Notes (Signed)
3 MONTH FOLLOW UP  Assessment and Plan:    Essential hypertension -     CBC with Differential/Platelet -     BASIC METABOLIC PANEL WITH GFR -     Hepatic function panel -     TSH - continue medications, DASH diet, exercise and monitor at home. Call if greater than 130/80.    Mixed hyperlipidemia -     Lipid panel -continue medications, check lipids, decrease fatty foods, increase activity.   Prediabetes -     Hemoglobin A1c Discussed general issues about diabetes pathophysiology and management., Educational material distributed., Suggested low cholesterol diet., Encouraged aerobic exercise., Discussed foot care., Reminded to get yearly retinal exam.  Chronic kidney disease (CKD) stage 2 -     BASIC METABOLIC PANEL WITH GFR  Medication management -     Magnesium   Over 30 minutes of exam, counseling, chart review, and critical decision making was performed Future Appointments Date Time Provider [place]  09/05/2017 2:30 PM Unk Pinto, MD [facility] None  03/15/2018 3:00 PM Unk Pinto, MD [facility] None    Subjective:   Lydia Edwards is a 66 y.o. female who presents for 3 month follow up on hypertension, prediabetes, hyperlipidemia, vitamin D def.   Her blood pressure has been controlled at home, today their BP is BP: 130/80 She does workout.  She does a lot of walking.  She walks at least a mile daily. She denies chest pain, shortness of breath, dizziness.  Has rash on right arm from tape from donating blood, it is getting better, no rash anywhere else.  She is on cholesterol medication and denies myalgias. Her cholesterol is at goal. The cholesterol last visit was:   Lab Results  Component Value Date   CHOL 180 02/14/2017   HDL 40 (L) 02/14/2017   LDLCALC 119 (H) 02/14/2017   LDLDIRECT 160.1 07/18/2009   TRIG 103 02/14/2017   CHOLHDL 4.5 02/14/2017   She has been working on diet and exercise for prediabetes, and denies foot ulcerations,  hyperglycemia, hypoglycemia , increased appetite, nausea, paresthesia of the feet, polydipsia, polyuria, visual disturbances, vomiting and weight loss. Last A1C in the office was:  Lab Results  Component Value Date   HGBA1C 5.9 (H) 02/14/2017   Patient is on Vitamin D supplement. Lab Results  Component Value Date   VD25OH 58 02/14/2017     BMI is Body mass index is 34.12 kg/m., she is working on diet and exercise. Wt Readings from Last 3 Encounters:  07/18/17 192 lb 9.6 oz (87.4 kg)  02/14/17 194 lb (88 kg)  10/25/16 197 lb 12.8 oz (89.7 kg)   She had an episode of nephrotic syndrome 1 year ago with a pneumonia, saw Dr. Lorrene Reid for that, has been discharged and we are still monitoring kidney functions.  Lab Results  Component Value Date   GFRAA 76 02/14/2017    Medication Review Current Outpatient Prescriptions on File Prior to Visit  Medication Sig Dispense Refill  . acetaminophen (TYLENOL) 500 MG tablet Take 500 mg by mouth every 6 (six) hours as needed for mild pain or headache.     Marland Kitchen aspirin EC 81 MG tablet Take 81 mg by mouth daily.    Marland Kitchen atorvastatin (LIPITOR) 40 MG tablet Take 1 tablet (40 mg total) by mouth daily. 90 tablet 1  . Esomeprazole Magnesium (NEXIUM PO) Take 1 capsule by mouth daily.    . fluticasone (FLONASE) 50 MCG/ACT nasal spray Place 2 sprays into both  nostrils daily. 16 g 0  . furosemide (LASIX) 20 MG tablet TAKE 1 TABLET BY MOUTH DAILY 30 tablet 0  . potassium chloride SA (K-DUR,KLOR-CON) 20 MEQ tablet TAKE 1 TABLET BY MOUTH DAILY 90 tablet 0  . promethazine-dextromethorphan (PROMETHAZINE-DM) 6.25-15 MG/5ML syrup Take 1 to 2 tsp enery 4 hours if needed for cough 360 mL 1   No current facility-administered medications on file prior to visit.     Current Problems (verified) Patient Active Problem List   Diagnosis Date Noted  . Other abnormal glucose 10/25/2016  . Chronic kidney disease (CKD) stage 2 07/19/2016  . Encounter for Medicare annual wellness  exam 10/06/2015  . Morbid obesity (32.60) 06/16/2015  . Medication management 09/02/2014  . Hyperlipidemia   . Hypertension   . Vitamin D deficiency   . SYNDROME, NEPHROTIC W/MINIMAL CHANGE LESION 07/12/2007  . COLONOSCOPY, HX OF 07/11/2007    Allergies Allergies  Allergen Reactions  . Ace Inhibitors   . Naproxen     REACTION: renal failure  . Norvasc [Amlodipine Besylate]     edema  . Nsaids     Surgical History: reviewed and unchanged Family History: reviewed and unchanged Social History: reviewed and unchanged   Review of Systems  Constitutional: Negative for chills, fever, malaise/fatigue and weight loss.  HENT: Negative for congestion, ear pain and sore throat.   Eyes: Negative.   Respiratory: Negative for cough, shortness of breath and wheezing.   Cardiovascular: Negative for chest pain, palpitations and leg swelling.  Gastrointestinal: Negative for blood in stool, constipation, diarrhea, heartburn and melena.  Genitourinary: Negative for dysuria, frequency, hematuria and urgency.  Skin: Negative.   Neurological: Negative for dizziness, sensory change, loss of consciousness and headaches.  Psychiatric/Behavioral: Negative for depression. The patient is not nervous/anxious and does not have insomnia.     Objective:   Today's Vitals   07/18/17 1051  BP: 130/80  Pulse: 70  Resp: 14  Temp: (!) 97.5 F (36.4 C)  SpO2: 98%  Weight: 192 lb 9.6 oz (87.4 kg)  Height: 5\' 3"  (1.6 m)   Body mass index is 34.12 kg/m.  General appearance: alert, no distress, WD/WN,  female HEENT: normocephalic, sclerae anicteric, TMs pearly, nares patent, no discharge or erythema, pharynx normal Oral cavity: MMM, no lesions Neck: supple, no lymphadenopathy, no thyromegaly, no masses Heart: RRR, normal S1, S2, no murmurs Lungs: CTA bilaterally, no wheezes, rhonchi, or rales Abdomen: +bs, soft, non tender, non distended, no masses, no hepatomegaly, no splenomegaly Musculoskeletal:  nontender, no swelling, no obvious deformity Extremities: no edema, no cyanosis, no clubbing Pulses: 2+ symmetric, upper and lower extremities, normal cap refill Neurological: alert, oriented x 3, CN2-12 intact, strength normal upper extremities and lower extremities, sensation normal throughout, DTRs 2+ throughout, no cerebellar signs, gait normal Psychiatric: normal affect, behavior normal, pleasant    Vicie Mutters, PA-C   07/18/2017

## 2017-07-18 ENCOUNTER — Encounter: Payer: Self-pay | Admitting: Physician Assistant

## 2017-07-18 ENCOUNTER — Ambulatory Visit (INDEPENDENT_AMBULATORY_CARE_PROVIDER_SITE_OTHER): Payer: PPO | Admitting: Physician Assistant

## 2017-07-18 VITALS — BP 130/80 | HR 70 | Temp 97.5°F | Resp 14 | Ht 63.0 in | Wt 192.6 lb

## 2017-07-18 DIAGNOSIS — E782 Mixed hyperlipidemia: Secondary | ICD-10-CM

## 2017-07-18 DIAGNOSIS — I10 Essential (primary) hypertension: Secondary | ICD-10-CM | POA: Diagnosis not present

## 2017-07-18 DIAGNOSIS — N182 Chronic kidney disease, stage 2 (mild): Secondary | ICD-10-CM

## 2017-07-18 DIAGNOSIS — Z79899 Other long term (current) drug therapy: Secondary | ICD-10-CM | POA: Diagnosis not present

## 2017-07-18 DIAGNOSIS — R7303 Prediabetes: Secondary | ICD-10-CM | POA: Diagnosis not present

## 2017-07-18 NOTE — Patient Instructions (Addendum)
Can put hydrocortisone cream over the counter on your rash  HOME CARE INSTRUCTIONS   Do not stand or sit in one position for long periods of time. Do not sit with your legs crossed. Rest with your legs raised during the day.  Your legs have to be higher than your heart so that gravity will force the valves to open, so please really elevate your legs.   Wear elastic stockings or support hose. Do not wear other tight, encircling garments around the legs, pelvis, or waist.  [employer]  has a wide variety of well priced compression stockings. [place], [place] [address] [phone]  Walk as much as possible to increase blood flow.  Raise the foot of your bed at night with 2-inch blocks. SEEK MEDICAL CARE IF:   The skin around your ankle starts to break down.  You have pain, redness, tenderness, or hard swelling developing in your leg over a vein.  You are uncomfortable due to leg pain. Document Released: 08/25/2005 Document Revised: 02/07/2012 Document Reviewed: 01/11/2011 [facility] Patient Information 2014 [place].   Peripheral Edema Peripheral edema is swelling that is caused by a buildup of fluid. Peripheral edema most often affects the lower legs, ankles, and feet. It can also develop in the arms, hands, and face. The area of the body that has peripheral edema will look swollen. It may also feel heavy or warm. Your clothes may start to feel tight. Pressing on the area may make a temporary dent in your skin. You may not be able to move your arm or leg as much as usual. There are many causes of peripheral edema. It can be a complication of other diseases, such as congestive heart failure, kidney disease, or a problem with your blood circulation. It also can be a side effect of certain medicines. It often happens to women during pregnancy. Sometimes, the cause is not known. Treating the underlying condition is often the only treatment for peripheral  edema. Follow these instructions at home: Pay attention to any changes in your symptoms. Take these actions to help with your discomfort:  Raise (elevate) your legs while you are sitting or lying down.  Move around often to prevent stiffness and to lessen swelling. Do not sit or stand for long periods of time.  Wear support stockings as told by your health care provider.  Follow instructions from your health care provider about limiting salt (sodium) in your diet. Sometimes eating less salt can reduce swelling.  Take over-the-counter and prescription medicines only as told by your health care provider. Your health care provider may prescribe medicine to help your body get rid of excess water (diuretic).  Keep all follow-up visits as told by your health care provider. This is important.  Contact a health care provider if:  You have a fever.  Your edema starts suddenly or is getting worse, especially if you are pregnant or have a medical condition.  You have swelling in only one leg.  You have increased swelling and pain in your legs. Get help right away if:  You develop shortness of breath, especially when you are lying down.  You have pain in your chest or abdomen.  You feel weak.  You faint. This information is not intended to replace advice given to you by your health care provider. Make sure you discuss any questions you have with your health care provider. Document Released: 12/23/2004 Document Revised: 04/19/2016 Document Reviewed: 05/28/2015 [employer] Interactive Patient  Education  2018 [employer]  

## 2017-07-19 LAB — LIPID PANEL
Cholesterol: 118 mg/dL (ref <200)
HDL: 44 mg/dL — ABNORMAL LOW (ref >50)
LDL CHOLESTEROL (CALC): 60 mg/dL
NON-HDL CHOLESTEROL (CALC): 74 mg/dL (ref <130)
Total CHOL/HDL Ratio: 2.7 (calc) (ref <5.0)
Triglycerides: 59 mg/dL (ref <150)

## 2017-07-19 LAB — BASIC METABOLIC PANEL WITH GFR
BUN: 10 mg/dL (ref 7–25)
CHLORIDE: 107 mmol/L (ref 98–110)
CO2: 28 mmol/L (ref 20–32)
Calcium: 9.2 mg/dL (ref 8.6–10.4)
Creat: 0.86 mg/dL (ref 0.50–0.99)
GFR, EST AFRICAN AMERICAN: 81 mL/min/{1.73_m2} (ref >=60)
GFR, Est Non African American: 70 mL/min/{1.73_m2} (ref >=60)
Glucose, Bld: 111 mg/dL — ABNORMAL HIGH (ref 65–99)
POTASSIUM: 4.2 mmol/L (ref 3.5–5.3)
Sodium: 143 mmol/L (ref 135–146)

## 2017-07-19 LAB — HEPATIC FUNCTION PANEL
AG Ratio: 1.5 (calc) (ref 1.0–2.5)
ALT: 16 U/L (ref 6–29)
AST: 17 U/L (ref 10–35)
Albumin: 4.3 g/dL (ref 3.6–5.1)
Alkaline phosphatase (APISO): 85 U/L (ref 33–130)
BILIRUBIN DIRECT: 0.1 mg/dL (ref 0.0–0.2)
BILIRUBIN INDIRECT: 0.5 mg/dL (ref 0.2–1.2)
BILIRUBIN TOTAL: 0.6 mg/dL (ref 0.2–1.2)
GLOBULIN: 2.8 g/dL (ref 1.9–3.7)
Total Protein: 7.1 g/dL (ref 6.1–8.1)

## 2017-07-19 LAB — CBC WITH DIFFERENTIAL/PLATELET
Basophils Absolute: 60 cells/uL (ref 0–200)
Basophils Relative: 0.8 %
EOS ABS: 203 {cells}/uL (ref 15–500)
Eosinophils Relative: 2.7 %
HCT: 38.7 % (ref 35.0–45.0)
Hemoglobin: 12.5 g/dL (ref 11.7–15.5)
Lymphs Abs: 2520 cells/uL (ref 850–3900)
MCH: 27.1 pg (ref 27.0–33.0)
MCHC: 32.3 g/dL (ref 32.0–36.0)
MCV: 83.9 fL (ref 80.0–100.0)
MPV: 9.7 fL (ref 7.5–12.5)
Monocytes Relative: 8.9 %
NEUTROS PCT: 54 %
Neutro Abs: 4050 cells/uL (ref 1500–7800)
PLATELETS: 291 10*3/uL (ref 140–400)
RBC: 4.61 10*6/uL (ref 3.80–5.10)
RDW: 12.8 % (ref 11.0–15.0)
TOTAL LYMPHOCYTE: 33.6 %
WBC: 7.5 10*3/uL (ref 3.8–10.8)
WBCMIX: 668 {cells}/uL (ref 200–950)

## 2017-07-19 LAB — HEMOGLOBIN A1C
EAG (MMOL/L): 7.1 (calc)
Hgb A1c MFr Bld: 6.1 % of total Hgb — ABNORMAL HIGH (ref <5.7)
MEAN PLASMA GLUCOSE: 128 (calc)

## 2017-07-19 LAB — MAGNESIUM: MAGNESIUM: 2 mg/dL (ref 1.5–2.5)

## 2017-07-19 LAB — TSH: TSH: 1.3 m[IU]/L (ref 0.40–4.50)

## 2017-07-19 NOTE — Progress Notes (Signed)
Pt aware of lab results & voiced understanding of those results.

## 2017-07-19 NOTE — Progress Notes (Signed)
LVM for pt to return office call for LAB results.

## 2017-09-05 ENCOUNTER — Ambulatory Visit: Payer: Self-pay | Admitting: Internal Medicine

## 2017-09-19 ENCOUNTER — Other Ambulatory Visit: Payer: Self-pay | Admitting: Internal Medicine

## 2017-10-03 ENCOUNTER — Other Ambulatory Visit: Payer: Self-pay | Admitting: Internal Medicine

## 2017-10-17 ENCOUNTER — Other Ambulatory Visit: Payer: Self-pay | Admitting: Internal Medicine

## 2017-10-17 DIAGNOSIS — Z139 Encounter for screening, unspecified: Secondary | ICD-10-CM

## 2017-10-24 ENCOUNTER — Ambulatory Visit: Payer: PPO | Admitting: Internal Medicine

## 2017-10-24 ENCOUNTER — Encounter: Payer: Self-pay | Admitting: Internal Medicine

## 2017-10-24 VITALS — BP 158/82 | HR 64 | Temp 97.5°F | Resp 16 | Ht 63.0 in | Wt 195.8 lb

## 2017-10-24 DIAGNOSIS — R7309 Other abnormal glucose: Secondary | ICD-10-CM | POA: Diagnosis not present

## 2017-10-24 DIAGNOSIS — R7303 Prediabetes: Secondary | ICD-10-CM

## 2017-10-24 DIAGNOSIS — Z79899 Other long term (current) drug therapy: Secondary | ICD-10-CM | POA: Diagnosis not present

## 2017-10-24 DIAGNOSIS — I10 Essential (primary) hypertension: Secondary | ICD-10-CM

## 2017-10-24 DIAGNOSIS — E782 Mixed hyperlipidemia: Secondary | ICD-10-CM

## 2017-10-24 DIAGNOSIS — E559 Vitamin D deficiency, unspecified: Secondary | ICD-10-CM

## 2017-10-24 MED ORDER — LOSARTAN POTASSIUM-HCTZ 100-25 MG PO TABS: ORAL_TABLET | ORAL | 1 refills | Status: DC

## 2017-10-24 NOTE — Patient Instructions (Signed)

## 2017-10-24 NOTE — Progress Notes (Signed)
This very nice 66 y.o. [name]  presents for 6 month follow up with Hypertension, Hyperlipidemia, Pre-Diabetes and Vitamin D Deficiency.  She was hospitalized in 2011with ARF consequent  of NSAID use and recovered w/UIVF resussitation. Then in 2016, she was hospitalized with CAP and ARF from severe dehydration requiring dialysis and renal functions recovered to her baseline CKD2 (GFR 83 ml/min).       Patient is treated for HTN (2002)  & BP has allegedly been controlled at home. Today's BP is elevated at 158/82 today as she's skipped her BP meds this morning. Patient has had no complaints of any cardiac type chest pain, palpitations, dyspnea / orthopnea / PND, dizziness, claudication, or dependent edema.     Hyperlipidemia is controlled with diet & meds. Patient denies myalgias or other med SE's. Last Lipids were not at goal: Lab Results  Component Value Date   CHOL 118 07/18/2017   HDL 44 (L) 07/18/2017   LDLCALC 119 (H) 02/14/2017   TRIG 59 07/18/2017   CHOLHDL 2.7 07/18/2017      Also, the patient has history of PreDiabetes (A1c 6.0% in/ 2012) and has had no symptoms of reactive hypoglycemia, diabetic polys, paresthesias or visual blurring.  Last A1c was  Lab Results  Component Value Date   HGBA1C 6.1 (H) 07/18/2017      Further, the patient also has history of Vitamin D Deficiency ("12" / 2010) and supplements vitamin D without any suspected side-effects. Last vitamin D was near goal:  Lab Results  Component Value Date   VD25OH 58 02/14/2017   Current Outpatient Medications on File Prior to Visit  Medication Sig  . acetaminophen (TYLENOL) 500 MG tablet Take 500 mg by mouth every 6 (six) hours as needed for mild pain or headache.   Marland Kitchen aspirin EC 81 MG tablet Take 81 mg by mouth daily.  Marland Kitchen atorvastatin (LIPITOR) 40 MG tablet TAKE 1 TABLET(40 MG) BY MOUTH DAILY  . fluticasone (FLONASE) 50 MCG/ACT nasal spray Place 2 sprays into both nostrils daily.   No current facility-administered  medications on file prior to visit.    Allergies  Allergen Reactions  . Ace Inhibitors   . Naproxen     REACTION: renal failure  . Norvasc [Amlodipine Besylate]     edema  . Nsaids    PMHx:   Past Medical History:  Diagnosis Date  . Hyperlipidemia   . Hypertension   . Prediabetes   . Vitamin D deficiency    Immunization History  Administered Date(s) Administered  . Influenza,inj,quad, With Preservative 10/25/2016  . Influenza,trivalent, recombinat, inj, PF 09/07/2015  . Influenza-Unspecified 08/30/2015  . Pneumococcal Polysaccharide-23 10/29/2015  . Td 02/19/2000   Past Surgical History:  Procedure Laterality Date  . ABDOMINAL HYSTERECTOMY    . EYE SURGERY Left 2017   Cataract sugery left eye   FHx:    Reviewed / unchanged  SHx:    Reviewed / unchanged  Systems Review:  Constitutional: Denies fever, chills, wt changes, headaches, insomnia, fatigue, night sweats, change in appetite. Eyes: Denies redness, blurred vision, diplopia, discharge, itchy, watery eyes.  ENT: Denies discharge, congestion, post nasal drip, epistaxis, sore throat, earache, hearing loss, dental pain, tinnitus, vertigo, sinus pain, snoring.  CV: Denies chest pain, palpitations, irregular heartbeat, syncope, dyspnea, diaphoresis, orthopnea, PND, claudication or edema. Respiratory: denies cough, dyspnea, DOE, pleurisy, hoarseness, laryngitis, wheezing.  Gastrointestinal: Denies dysphagia, odynophagia, heartburn, reflux, water brash, abdominal pain or cramps, nausea, vomiting, bloating, diarrhea, constipation, hematemesis,  melena, hematochezia  or hemorrhoids. Genitourinary: Denies dysuria, frequency, urgency, nocturia, hesitancy, discharge, hematuria or flank pain. Musculoskeletal: Denies arthralgias, myalgias, stiffness, jt. swelling, pain, limping or strain/sprain.  Skin: Denies pruritus, rash, hives, warts, acne, eczema or change in skin lesion(s). Neuro: No weakness, tremor, incoordination, spasms,  paresthesia or pain. Psychiatric: Denies confusion, memory loss or sensory loss. Endo: Denies change in weight, skin or hair change.  Heme/Lymph: No excessive bleeding, bruising or enlarged lymph nodes.  Physical Exam  BP (!) 158/82   Pulse 64   Temp (!) 97.5 F (36.4 C)   Resp 16   Ht 5\' 3"  (1.6 m)   Wt 195 lb 12.8 oz (88.8 kg)   BMI 34.68 kg/m   Appears well nourished, well groomed  and in no distress.  Eyes: PERRLA, EOMs, conjunctiva no swelling or erythema. Sinuses: No frontal/maxillary tenderness ENT/Mouth: EAC's clear, TM's nl w/o erythema, bulging. Nares clear w/o erythema, swelling, exudates. Oropharynx clear without erythema or exudates. Oral hygiene is good. Tongue normal, non obstructing. Hearing intact.  Neck: Supple. Thyroid nl. Car 2+/2+ without bruits, nodes or JVD. Chest: Respirations nl with BS clear & equal w/o rales, rhonchi, wheezing or stridor.  Cor: Heart sounds normal w/ regular rate and rhythm without sig. murmurs, gallops, clicks or rubs. Peripheral pulses normal and equal  without edema.  Abdomen: Soft & bowel sounds normal. Non-tender w/o guarding, rebound, hernias, masses or organomegaly.  Lymphatics: Unremarkable.  Musculoskeletal: Full ROM all peripheral extremities, joint stability, 5/5 strength and normal gait.  Skin: Warm, dry without exposed rashes, lesions or ecchymosis apparent.  Neuro: Cranial nerves intact, reflexes equal bilaterally. Sensory-motor testing grossly intact. Tendon reflexes grossly intact.  Pysch: Alert & oriented x 3.  Insight and judgement nl & appropriate. No ideations.  Assessment and Plan:  1. Essential hypertension  - D/c Lasix (& KCl) and replace w/Losartan/HCTz 100/25 - 1/2 to 1 tab qd   - Continue medication, monitor blood pressure at home & call if BP remains elevated.   - Continue DASH diet. Reminder to go to the ER if any CP,  SOB, nausea, dizziness, severe HA, changes vision/speech.  - CBC with  Differential/Platelet - BASIC METABOLIC PANEL WITH GFR - Magnesium - TSH  2. Hyperlipidemia, mixed  - Continue diet/meds, exercise,& lifestyle modifications.  - Continue monitor periodic cholesterol/liver & renal functions   - Hepatic function panel - Lipid panel - TSH  3. Prediabetes  - Continue diet, exercise, lifestyle modifications.  - Monitor appropriate labs.  - Hemoglobin A1c - Insulin, random  4. Vitamin D deficiency  - Continue supplementation.  - VITAMIN D 25 Hydroxy  5. Abnormal glucose  - Hemoglobin A1c - Insulin, random  6. Medication management  - CBC with Differential/Platelet - BASIC METABOLIC PANEL WITH GFR - Hepatic function panel - Magnesium - Lipid panel - TSH - Hemoglobin A1c - Insulin, random - VITAMIN D 25 Hydroxy        Discussed  regular exercise, BP monitoring, weight control to achieve/maintain BMI less than 25 and discussed med and SE's. Recommended labs to assess and monitor clinical status with further disposition pending results of labs. Over 30 minutes of exam, counseling, chart review was performed.

## 2017-10-25 LAB — LIPID PANEL
Cholesterol: 135 mg/dL (ref <200)
HDL: 46 mg/dL — ABNORMAL LOW (ref >50)
LDL Cholesterol (Calc): 71 mg/dL (calc)
NON-HDL CHOLESTEROL (CALC): 89 mg/dL (ref <130)
Total CHOL/HDL Ratio: 2.9 (calc) (ref <5.0)
Triglycerides: 92 mg/dL (ref <150)

## 2017-10-25 LAB — BASIC METABOLIC PANEL WITH GFR
BUN: 13 mg/dL (ref 7–25)
CHLORIDE: 107 mmol/L (ref 98–110)
CO2: 31 mmol/L (ref 20–32)
Calcium: 9.2 mg/dL (ref 8.6–10.4)
Creat: 0.75 mg/dL (ref 0.50–0.99)
GFR, EST AFRICAN AMERICAN: 96 mL/min/{1.73_m2} (ref >=60)
GFR, Est Non African American: 82 mL/min/{1.73_m2} (ref >=60)
Glucose, Bld: 98 mg/dL (ref 65–99)
POTASSIUM: 4.6 mmol/L (ref 3.5–5.3)
SODIUM: 141 mmol/L (ref 135–146)

## 2017-10-25 LAB — HEPATIC FUNCTION PANEL
AG Ratio: 1.3 (calc) (ref 1.0–2.5)
ALT: 16 U/L (ref 6–29)
AST: 17 U/L (ref 10–35)
Albumin: 4.1 g/dL (ref 3.6–5.1)
Alkaline phosphatase (APISO): 82 U/L (ref 33–130)
BILIRUBIN INDIRECT: 0.4 mg/dL (ref 0.2–1.2)
BILIRUBIN TOTAL: 0.5 mg/dL (ref 0.2–1.2)
Bilirubin, Direct: 0.1 mg/dL (ref 0.0–0.2)
Globulin: 3.1 g/dL (calc) (ref 1.9–3.7)
Total Protein: 7.2 g/dL (ref 6.1–8.1)

## 2017-10-25 LAB — CBC WITH DIFFERENTIAL/PLATELET
BASOS ABS: 60 {cells}/uL (ref 0–200)
Basophils Relative: 0.8 %
Eosinophils Absolute: 203 cells/uL (ref 15–500)
Eosinophils Relative: 2.7 %
HCT: 40.9 % (ref 35.0–45.0)
Hemoglobin: 13 g/dL (ref 11.7–15.5)
Lymphs Abs: 2835 cells/uL (ref 850–3900)
MCH: 26.3 pg — AB (ref 27.0–33.0)
MCHC: 31.8 g/dL — AB (ref 32.0–36.0)
MCV: 82.6 fL (ref 80.0–100.0)
MPV: 10 fL (ref 7.5–12.5)
Monocytes Relative: 8.2 %
NEUTROS PCT: 50.5 %
Neutro Abs: 3788 cells/uL (ref 1500–7800)
PLATELETS: 253 10*3/uL (ref 140–400)
RBC: 4.95 10*6/uL (ref 3.80–5.10)
RDW: 12.6 % (ref 11.0–15.0)
TOTAL LYMPHOCYTE: 37.8 %
WBC: 7.5 10*3/uL (ref 3.8–10.8)
WBCMIX: 615 {cells}/uL (ref 200–950)

## 2017-10-25 LAB — HEMOGLOBIN A1C
EAG (MMOL/L): 7.3 (calc)
Hgb A1c MFr Bld: 6.2 % of total Hgb — ABNORMAL HIGH (ref <5.7)
Mean Plasma Glucose: 131 (calc)

## 2017-10-25 LAB — TSH: TSH: 1.66 m[IU]/L (ref 0.40–4.50)

## 2017-10-25 LAB — VITAMIN D 25 HYDROXY (VIT D DEFICIENCY, FRACTURES): Vit D, 25-Hydroxy: 48 ng/mL (ref 30–100)

## 2017-10-25 LAB — INSULIN, RANDOM: Insulin: 21 u[IU]/mL — ABNORMAL HIGH (ref 2.0–19.6)

## 2017-10-25 LAB — MAGNESIUM: MAGNESIUM: 1.9 mg/dL (ref 1.5–2.5)

## 2017-11-16 ENCOUNTER — Ambulatory Visit
Admission: RE | Admit: 2017-11-16 | Discharge: 2017-11-16 | Disposition: A | Discharge status: Home / Self Care | Payer: PPO | Source: Ambulatory Visit | Attending: Internal Medicine | Admitting: Internal Medicine

## 2017-11-16 DIAGNOSIS — Z1231 Encounter for screening mammogram for malignant neoplasm of breast: Secondary | ICD-10-CM | POA: Diagnosis not present

## 2017-11-16 DIAGNOSIS — Z139 Encounter for screening, unspecified: Secondary | ICD-10-CM

## 2017-12-06 DIAGNOSIS — Z961 Presence of intraocular lens: Secondary | ICD-10-CM | POA: Diagnosis not present

## 2017-12-06 DIAGNOSIS — H2511 Age-related nuclear cataract, right eye: Secondary | ICD-10-CM | POA: Diagnosis not present

## 2017-12-06 DIAGNOSIS — H25011 Cortical age-related cataract, right eye: Secondary | ICD-10-CM | POA: Diagnosis not present

## 2017-12-06 DIAGNOSIS — H25041 Posterior subcapsular polar age-related cataract, right eye: Secondary | ICD-10-CM | POA: Diagnosis not present

## 2017-12-06 DIAGNOSIS — H40003 Preglaucoma, unspecified, bilateral: Secondary | ICD-10-CM | POA: Diagnosis not present

## 2017-12-30 DIAGNOSIS — H2511 Age-related nuclear cataract, right eye: Secondary | ICD-10-CM | POA: Diagnosis not present

## 2018-01-25 ENCOUNTER — Other Ambulatory Visit: Payer: Self-pay | Admitting: Internal Medicine

## 2018-01-26 NOTE — Progress Notes (Signed)
WELCOME TO MEDICARE VISIT AND 3 MONTH FOLLOW UP  Assessment:    Encounter for Medicare annual wellness exam 1 year  Essential hypertension - continue medications, DASH diet, exercise and monitor at home. Call if greater than 130/80.  -     CBC with Differential/Platelet -     BASIC METABOLIC PANEL WITH GFR -     Hepatic function panel -     TSH  Mixed hyperlipidemia -continue medications, check lipids, decrease fatty foods, increase activity.  -     Lipid panel  Chronic kidney disease (CKD) stage 2 Increase fluids, avoid NSAIDS, monitor sugars, will monitor -     BASIC METABOLIC PANEL WITH GFR  SYNDROME, NEPHROTIC W/MINIMAL CHANGE LESION Will monitor -     BASIC METABOLIC PANEL WITH GFR  Medication management  Vitamin D deficiency Continue supplement  Other abnormal glucose -     Hemoglobin A1c  Need for vaccination with 13-polyvalent pneumococcal conjugate vaccine -     Pneumococcal conjugate vaccine 13-valent IM    Over 30 minutes of exam, counseling, chart review, and critical decision making was performed Future Appointments  Date Time Provider [place]  05/22/2018 11:00 AM Unk Pinto, MD [facility] None     Plan:   During the course of the visit the patient was educated and counseled about appropriate screening and preventive services including:    Pneumococcal vaccine   Influenza vaccine  Td vaccine  Prevnar 13  Screening electrocardiogram  Screening mammography  Bone densitometry screening  Colorectal cancer screening  Diabetes screening  Glaucoma screening  Nutrition counseling   Advanced directives: given info/requested copies   Subjective:   Lydia Edwards is a 67 y.o. female who presents for Medicare Annual Wellness Visit and 3 month follow up on hypertension, prediabetes, hyperlipidemia, vitamin D def.   She works for [employer], normally [employer]- Friday. She states that they want her to start working every other  weekend, however her husband has severe COPD and back pain. She has someone that can help him during the week but she needs to be with him on the weekends to take him to appointments, help him with medications, and be there in case of a flare.   Her blood pressure has been controlled at home, today their BP is BP: (!) 146/82 She does workout.  She does a lot of walking.  She walks at least a mile daily. She denies chest pain, shortness of breath, dizziness.   She is on cholesterol medication and denies myalgias. Her cholesterol is at goal. The cholesterol last visit was:   Lab Results  Component Value Date   CHOL 135 10/24/2017   HDL 46 (L) 10/24/2017   LDLCALC 119 (H) 02/14/2017   LDLDIRECT 160.1 07/18/2009   TRIG 92 10/24/2017   CHOLHDL 2.9 10/24/2017   She has been working on diet and exercise for prediabetes, and denies foot ulcerations, hyperglycemia, hypoglycemia , increased appetite, nausea, paresthesia of the feet, polydipsia, polyuria, visual disturbances, vomiting and weight loss. Last A1C in the office was:  Lab Results  Component Value Date   HGBA1C 6.2 (H) 10/24/2017   Patient is on Vitamin D supplement. Lab Results  Component Value Date   VD25OH 48 10/24/2017     BMI is Body mass index is 34.86 kg/m., she is working on diet and exercise. Wt Readings from Last 3 Encounters:  01/30/18 196 lb 12.8 oz (89.3 kg)  10/24/17 195 lb 12.8 oz (88.8 kg)  07/18/17 192 lb  9.6 oz (87.4 kg)   She has a history of nephrotic syndrome in the past associated with a pneumonia, saw Dr. Lorrene Reid for that, has been discharged and we are still monitoring kidney functions.  Lab Results  Component Value Date   GFRAA 96 10/24/2017    Medication Review Current Outpatient Medications on File Prior to Visit  Medication Sig Dispense Refill  . acetaminophen (TYLENOL) 500 MG tablet Take 500 mg by mouth every 6 (six) hours as needed for mild pain or headache.     Marland Kitchen aspirin EC 81 MG tablet Take 81  mg by mouth daily.    Marland Kitchen atorvastatin (LIPITOR) 40 MG tablet TAKE 1 TABLET(40 MG) BY MOUTH DAILY 90 tablet 0  . losartan-hydrochlorothiazide (HYZAAR) 100-25 MG tablet Take 1/2 to 1 tablet daily for BP 90 tablet 1   No current facility-administered medications on file prior to visit.     Current Problems (verified) Patient Active Problem List   Diagnosis Date Noted  . Other abnormal glucose 10/25/2016  . Chronic kidney disease (CKD) stage 2 07/19/2016  . Encounter for Medicare annual wellness exam 10/06/2015  . Medication management 09/02/2014  . Hyperlipidemia   . Hypertension   . Vitamin D deficiency   . SYNDROME, NEPHROTIC W/MINIMAL CHANGE LESION 07/12/2007    Screening Tests Immunization History  Administered Date(s) Administered  . Influenza,inj,quad, With Preservative 10/25/2016  . Influenza,trivalent, recombinat, inj, PF 09/07/2015  . Influenza-Unspecified 08/30/2015  . Pneumococcal Polysaccharide-23 10/29/2015  . Td 02/19/2000    Preventative care: Last colonoscopy: 08/2015 Last mammogram: 08/2017 Last pap smear/pelvic exam:   declines DEXA: 05/2016  Prior vaccinations: TD or Tdap: Patient is due for tetanus  But just wants flu shot today  Influenza: 2018 at church Pneumococcal: 61 Prevnar13: Due Shingles/Zostavax: declines  Names of Other Physician/Practitioners you currently use: 1. [facility]- here for primary care 2. Dr. Talbert Forest, eye doctor, last visit 6-8 weeks ago for cataract surgery 3. [facility], last visit 2 years ago Patient Care Team: Unk Pinto, MD as PCP - General (Internal Medicine) Vania Rea, MD as Consulting Physician (Obstetrics and Gynecology) Lafayette Dragon, MD (Inactive) as Consulting Physician (Gastroenterology)  Allergies Allergies  Allergen Reactions  . Ace Inhibitors   . Naproxen     REACTION: renal failure  . Norvasc [Amlodipine Besylate]     edema  . Nsaids      SURGICAL HISTORY She  has a past surgical history that includes Abdominal hysterectomy and Eye surgery (Left, 2017). FAMILY HISTORY Her family history includes Cancer in her sister; Heart attack in her brother; Hypertension in her sister; Stroke in her sister. SOCIAL HISTORY She  reports that  has never smoked. she has never used smokeless tobacco. She reports that she does not drink alcohol or use drugs.  MEDICARE WELLNESS OBJECTIVES: Physical activity:   Cardiac risk factors:   Depression/mood screen:   Depression screen [facility] 2/9 01/30/2018  Decreased Interest 0  Down, Depressed, Hopeless 0  PHQ - 2 Score 0    ADLs:  In your present state of health, do you have any difficulty performing the following activities: 10/24/2017 02/14/2017  Hearing? N N  Vision? N N  Difficulty concentrating or making decisions? N N  Walking or climbing stairs? N N  Dressing or bathing? N N  Doing errands, shopping? N N  Some recent data might be hidden     Cognitive Testing  Alert? Yes  Normal Appearance?Yes  Oriented to  person? Yes  Place? Yes   Time? Yes  Recall of three objects?  Yes  Can perform simple calculations? Yes  Displays appropriate judgment?Yes  Can read the correct time from a watch face?Yes  EOL planning: Does Patient Have a Medical Advance Directive?: No Would patient like information on creating a medical advance directive?: Yes (MAU/Ambulatory/Procedural Areas - Information given)   Review of Systems  Constitutional: Negative for chills, fever, malaise/fatigue and weight loss.  HENT: Negative for congestion, ear pain and sore throat.   Eyes: Negative.   Respiratory: Negative for cough, shortness of breath and wheezing.   Cardiovascular: Negative for chest pain, palpitations and leg swelling.  Gastrointestinal: Negative for blood in stool, constipation, diarrhea, heartburn and melena.  Genitourinary: Negative for dysuria, frequency, hematuria and urgency.  Skin:  Negative.   Neurological: Negative for dizziness, sensory change, loss of consciousness and headaches.  Psychiatric/Behavioral: Negative for depression. The patient is not nervous/anxious and does not have insomnia.     Objective:   Today's Vitals   01/30/18 1101  BP: (!) 146/82  Pulse: 60  Resp: 18  Temp: (!) 97.2 F (36.2 C)  Weight: 196 lb 12.8 oz (89.3 kg)  Height: 5\' 3"  (1.6 m)   Body mass index is 34.86 kg/m.  General appearance: alert, no distress, WD/WN,  female HEENT: normocephalic, sclerae anicteric, TMs pearly, nares patent, no discharge or erythema, pharynx normal Oral cavity: MMM, no lesions Neck: supple, no lymphadenopathy, no thyromegaly, no masses Heart: RRR, normal S1, S2, no murmurs Lungs: CTA bilaterally, no wheezes, rhonchi, or rales Abdomen: +bs, soft, non tender, non distended, no masses, no hepatomegaly, no splenomegaly Musculoskeletal: nontender, no swelling, no obvious deformity Extremities: no edema, no cyanosis, no clubbing Pulses: 2+ symmetric, upper and lower extremities, normal cap refill Neurological: alert, oriented x 3, CN2-12 intact, strength normal upper extremities and lower extremities, sensation normal throughout, DTRs 2+ throughout, no cerebellar signs, gait normal Psychiatric: normal affect, behavior normal, pleasant  Breast: defer Gyn: defer Rectal: defer   Medicare Attestation I have personally reviewed: The patient's medical and social history Their use of alcohol, tobacco or illicit drugs Their current medications and supplements The patient's functional ability including ADLs,fall risks, home safety risks, cognitive, and hearing and visual impairment Diet and physical activities Evidence for depression or mood disorders  The patient's weight, height, BMI, and visual acuity have been recorded in the chart.  I have made referrals, counseling, and provided education to the patient based on review of the above and I have provided  the patient with a written personalized care plan for preventive services.     Vicie Mutters, PA-C   01/30/2018

## 2018-01-30 ENCOUNTER — Ambulatory Visit (INDEPENDENT_AMBULATORY_CARE_PROVIDER_SITE_OTHER): Payer: PPO | Admitting: Physician Assistant

## 2018-01-30 ENCOUNTER — Ambulatory Visit: Payer: Self-pay | Admitting: Adult Health

## 2018-01-30 ENCOUNTER — Encounter: Payer: Self-pay | Admitting: Physician Assistant

## 2018-01-30 VITALS — BP 146/82 | HR 60 | Temp 97.2°F | Resp 18 | Ht 63.0 in | Wt 196.8 lb

## 2018-01-30 DIAGNOSIS — I10 Essential (primary) hypertension: Secondary | ICD-10-CM | POA: Diagnosis not present

## 2018-01-30 DIAGNOSIS — E559 Vitamin D deficiency, unspecified: Secondary | ICD-10-CM | POA: Diagnosis not present

## 2018-01-30 DIAGNOSIS — Z79899 Other long term (current) drug therapy: Secondary | ICD-10-CM | POA: Diagnosis not present

## 2018-01-30 DIAGNOSIS — Z6834 Body mass index (BMI) 34.0-34.9, adult: Secondary | ICD-10-CM | POA: Diagnosis not present

## 2018-01-30 DIAGNOSIS — R7309 Other abnormal glucose: Secondary | ICD-10-CM

## 2018-01-30 DIAGNOSIS — Z0001 Encounter for general adult medical examination with abnormal findings: Secondary | ICD-10-CM

## 2018-01-30 DIAGNOSIS — N040 Nephrotic syndrome with minor glomerular abnormality: Secondary | ICD-10-CM

## 2018-01-30 DIAGNOSIS — R6889 Other general symptoms and signs: Secondary | ICD-10-CM

## 2018-01-30 DIAGNOSIS — E782 Mixed hyperlipidemia: Secondary | ICD-10-CM

## 2018-01-30 DIAGNOSIS — N182 Chronic kidney disease, stage 2 (mild): Secondary | ICD-10-CM | POA: Diagnosis not present

## 2018-01-30 DIAGNOSIS — Z23 Encounter for immunization: Secondary | ICD-10-CM | POA: Diagnosis not present

## 2018-01-30 DIAGNOSIS — Z0000 Encounter for general adult medical examination without abnormal findings: Secondary | ICD-10-CM

## 2018-01-30 NOTE — Patient Instructions (Signed)
Bad carbs also include fruit juice, alcohol, and sweet tea. These are empty calories that do not signal to your brain that you are full.   Please remember the good carbs are still carbs which convert into sugar. So please measure them out no more than 1/2-1 cup of rice, oatmeal, pasta, and beans  Veggies are however free foods! Pile them on.   Not all fruit is created equal. Please see the list below, the fruit at the bottom is higher in sugars than the fruit at the top. Please avoid all dried fruits.     8 Critical Weight-Loss Tips That Aren't Diet and Exercise  1. STARVE THE DISTRACTIONS  All too often when we eat, we're also multitasking: watching TV, answering emails, scrolling through social media. These habits are detrimental to having a strong, clear, healthy relationship with food, and they can hinder our ability to make dietary changes.  In order to truly focus on what you're eating, how much you're eating, why you're eating those specific foods and, most importantly, how those foods make you feel, you need to starve the distractions. That means when you eat, just eat. Focus on your food, the process it went through to end up on your plate, where it came from and how it nourishes you. With this technique, you're more likely to finish a meal feeling satiated.  2.  CONSIDER WHAT YOU'RE NOT WILLING TO DO  This might sound counterintuitive, but it can help provide a "why" when motivation is waning. Declare, in writing, what you are unwilling to do, for example "I am unwilling to be the old dad who cannot play sports with my children".  So consider what you're not willing to accept, write it down, and keep it at the ready.  3.  STOP LABELING FOOD "GOOD" AND "BAD"  You've probably heard someone say they ate something "bad." Maybe you've even said it yourself.  The trouble with 'bad' foods isn't that they'll send you to the grave after a bite or two. The trouble comes when we eat  excessive portions of really calorie-dense foods meal after meal, day after day.  Instead of labeling foods as good or bad, think about which foods you can eat a lot of, and which ones you should just eat a little of. Then, plan ways to eat the foods you really like in portions that fit with your overall goals. A good example of this would be having a slice of pizza alongside a club salad with chicken breast, avocado and a bit of dressing. This is vastly different than 3 slices of pizza, 4 breadsticks with cheese sauce and half of a liter of regular soda.  4.  BRUSH YOUR TEETH AFTER YOU EAT  Getting your mindset in order is important, but sometimes small habits can make a big difference. After eating, you still have the taste of food in their mouth, which often causes people to eat more even if they are full or engage in a nibble or two of dessert.  Brushing your teeth will remove the taste of food from your mouth, and the clean, minty freshness will serve as a cue that mealtime is over.  5.  FOCUS ON CROWDING NOT CUTTING  The most common first step during 'dieting' is to cut. We cut our portion sizes down, we cut out 'bad' foods, we cut out entire food groups. This act of cutting puts [place] and our minds into scarcity mode.  When something  is off-limits, even if you're able to avoid it for a while, you could end up bingeing on it later because you've gone so long without it. So, instead of cutting, focus on crowding. If you crowd your plate and fill it up with more foods like veggies and protein, it simply allows less room for the other stuff. In other words, shift your focus away from what you can't eat, and celebrate the foods that will help you reach your goals.  6.  TAKE TRACKING A STEP FURTHER  Track what you eat, when you ate it, how much you ate and how that food made you feel. Being completely honest with yourself and writing down every single thing that passes through your lips will help you  start to notice that maybe you actually do snack, possibly take in more sugar than you thought, eat when you're bored rather than just hungry or maybe that you have a habit of snacking before bed while watching TV.  The difference from simply tracking your food intake is you're taking into account how food makes you feel, as well as what you're doing while you're eating. This is about becoming more mindful of what, when and why you eat.  7.  PRIORITIZE GOOD SLEEP  One of the strongest risk factors for being overweight is poor sleep. When you're feeling tired, you're more likely to choose unhealthy comfort foods and to skip your workout. Additionally, sleep deprivation may slow down your metabolism. [employer]! Therefore, sleeping 7-8 hours per night can help with weight loss without having to change your diet or increase your physical activity. And if you feel you snore and still wake up tired, talk with me about sleep apnea.  8.  SET ASIDE TIME TO DISCONNECT  Just get out there. Disconnect from the electronics and connect to the elements. Not only will this help reduce stress (a major factor in weight gain) by giving your mind a break from the constant stimulation we've all become so accustomed to, but it may also reprogram your brain to connect with yourself and what you're feeling.

## 2018-01-31 LAB — CBC WITH DIFFERENTIAL/PLATELET
BASOS ABS: 58 {cells}/uL (ref 0–200)
Basophils Relative: 0.8 %
EOS ABS: 131 {cells}/uL (ref 15–500)
Eosinophils Relative: 1.8 %
HCT: 35.4 % (ref 35.0–45.0)
Hemoglobin: 11.7 g/dL (ref 11.7–15.5)
Lymphs Abs: 2526 cells/uL (ref 850–3900)
MCH: 27.3 pg (ref 27.0–33.0)
MCHC: 33.1 g/dL (ref 32.0–36.0)
MCV: 82.5 fL (ref 80.0–100.0)
MONOS PCT: 9.7 %
MPV: 9.4 fL (ref 7.5–12.5)
Neutro Abs: 3876 cells/uL (ref 1500–7800)
Neutrophils Relative %: 53.1 %
PLATELETS: 280 10*3/uL (ref 140–400)
RBC: 4.29 10*6/uL (ref 3.80–5.10)
RDW: 13.4 % (ref 11.0–15.0)
TOTAL LYMPHOCYTE: 34.6 %
WBC mixed population: 708 cells/uL (ref 200–950)
WBC: 7.3 10*3/uL (ref 3.8–10.8)

## 2018-01-31 LAB — HEPATIC FUNCTION PANEL
AG Ratio: 1.8 (calc) (ref 1.0–2.5)
ALKALINE PHOSPHATASE (APISO): 71 U/L (ref 33–130)
ALT: 13 U/L (ref 6–29)
AST: 16 U/L (ref 10–35)
Albumin: 4.6 g/dL (ref 3.6–5.1)
BILIRUBIN INDIRECT: 0.6 mg/dL (ref 0.2–1.2)
Bilirubin, Direct: 0.2 mg/dL (ref 0.0–0.2)
Globulin: 2.6 g/dL (calc) (ref 1.9–3.7)
TOTAL PROTEIN: 7.2 g/dL (ref 6.1–8.1)
Total Bilirubin: 0.8 mg/dL (ref 0.2–1.2)

## 2018-01-31 LAB — BASIC METABOLIC PANEL WITH GFR
BUN: 13 mg/dL (ref 7–25)
CHLORIDE: 105 mmol/L (ref 98–110)
CO2: 30 mmol/L (ref 20–32)
CREATININE: 0.83 mg/dL (ref 0.50–0.99)
Calcium: 9.4 mg/dL (ref 8.6–10.4)
GFR, Est African American: 85 mL/min/{1.73_m2} (ref >=60)
GFR, Est Non African American: 73 mL/min/{1.73_m2} (ref >=60)
GLUCOSE: 102 mg/dL — AB (ref 65–99)
Potassium: 4.3 mmol/L (ref 3.5–5.3)
SODIUM: 141 mmol/L (ref 135–146)

## 2018-01-31 LAB — HEMOGLOBIN A1C
HEMOGLOBIN A1C: 6.1 %{Hb} — AB (ref <5.7)
Mean Plasma Glucose: 128 (calc)
eAG (mmol/L): 7.1 (calc)

## 2018-01-31 LAB — LIPID PANEL
Cholesterol: 147 mg/dL (ref <200)
HDL: 42 mg/dL — AB (ref >50)
LDL Cholesterol (Calc): 88 mg/dL (calc)
NON-HDL CHOLESTEROL (CALC): 105 mg/dL (ref <130)
Total CHOL/HDL Ratio: 3.5 (calc) (ref <5.0)
Triglycerides: 77 mg/dL (ref <150)

## 2018-01-31 LAB — TSH: TSH: 1.06 mIU/L (ref 0.40–4.50)

## 2018-03-15 ENCOUNTER — Encounter: Payer: Self-pay | Admitting: Internal Medicine

## 2018-05-12 ENCOUNTER — Other Ambulatory Visit: Payer: Self-pay | Admitting: Internal Medicine

## 2018-05-21 ENCOUNTER — Encounter: Payer: Self-pay | Admitting: Internal Medicine

## 2018-05-21 NOTE — Patient Instructions (Addendum)

## 2018-05-21 NOTE — Progress Notes (Signed)
[facility] Lydia Edwards, M.D.     Lydia Edwards. Lydia Edwards, [name] Lydia Edwards, [place], [place] [address] Telephone [phone] Telefax [phone] Annual Screening/Preventative Visit & Comprehensive Evaluation &  Examination     This very nice 67 y.o. [name] presents for a Screening /Preventative Visit & comprehensive evaluation and management of multiple medical co-morbidities.  Patient has been followed for HTN, HLD, T2_NIDDM  Prediabetes  and Vitamin D Deficiency.    Patient was hospitalized in 2011with ARF attributed to NSAID use and recovered. In 2016, she was hospitalized with CAP and severe dehydration & AKI requiring dialysis and renal functions recovered to her baseline CKD2 (GFR 83 ml/min).        HTN predates circa 2002. Patient's BP has been controlled at home and patient denies any cardiac symptoms as chest pain, palpitations, shortness of breath, dizziness or ankle swelling. Today's BP is at goal - 126/80.      Patient's hyperlipidemia is controlled with diet and medications. Patient denies myalgias or other medication SE's. Last lipids were at goal; Lab Results  Component Value Date   CHOL 147 01/30/2018   HDL 42 (L) 01/30/2018   LDLCALC 88 01/30/2018   LDLDIRECT 160.1 07/18/2009   TRIG 77 01/30/2018   CHOLHDL 3.5 01/30/2018      Patient has prediabetes  (A1c 6.2%/2012) and patient denies reactive hypoglycemic symptoms, visual blurring, diabetic polys, or paresthesias. Last A1c was not at goal: Lab Results  Component Value Date   HGBA1C 6.1 (H) 01/30/2018      Finally, patient has history of Vitamin D Deficiency  ("12"/2012) and last Vitamin D was still low (goal 70-100): Lab Results  Component Value Date   VD25OH 48 10/24/2017   Current Outpatient Medications on File Prior to Visit  Medication Sig  . acetaminophen (TYLENOL) 500 MG tablet Take 500 mg by mouth every  6 (six) hours as needed for mild pain or headache.   Marland Kitchen aspirin EC 81 MG tablet Take 81 mg by mouth daily.  Marland Kitchen atorvastatin (LIPITOR) 40 MG tablet TAKE 1 TABLET(40 MG) BY MOUTH DAILY  . Cholecalciferol (VITAMIN D PO) Take 5,000 Units by mouth daily.  Marland Kitchen losartan-hydrochlorothiazide (HYZAAR) 100-25 MG tablet Take 1/2 to 1 tablet daily for BP  . Magnesium 400 MG TABS Take 1 tablet by mouth daily.  . potassium chloride SA (K-DUR,KLOR-CON) 20 MEQ tablet TAKE 1 TABLET BY MOUTH DAILY   No current facility-administered medications on file prior to visit.    Allergies  Allergen Reactions  . Ace Inhibitors   . Naproxen     REACTION: renal failure  . Norvasc [Amlodipine Besylate]     edema  . Nsaids    Past Medical History:  Diagnosis Date  . Hyperlipidemia   . Hypertension   . Prediabetes   . Vitamin D deficiency    Health Maintenance  Topic Date Due  . TETANUS/TDAP  05/23/2019 (Originally 02/18/2010)  . INFLUENZA VACCINE  06/29/2018  . MAMMOGRAM  11/17/2019  . COLONOSCOPY  09/09/2025  . DEXA SCAN  Completed  . Hepatitis C Screening  Completed  . PNA vac Low Risk Adult  Completed   Immunization History  Administered Date(s) Administered  . Influenza,inj,quad, With Preservative 10/25/2016  . Influenza,trivalent, recombinat, inj, PF 09/07/2015  . Influenza-Unspecified 08/30/2015  . Pneumococcal Conjugate-13 01/30/2018  . Pneumococcal Polysaccharide-23 10/29/2015  . Td 02/19/2000   Last Colon - 09/10/2015 - Dr Lydia Edwards -  hyperplastic polyp - 10 yr f/u  Last MGM - 11/16/2017  Last dexaBMD -06/14/2016 - Normal - No Osteoporosis  Past Surgical History:  Procedure Laterality Date  . ABDOMINAL HYSTERECTOMY    . EYE SURGERY Left 2017   Cataract sugery left eye   Family History  Problem Relation Age of Onset  . Stroke Sister   . Hypertension Sister   . Cancer Sister   . Heart attack Brother   . Colon cancer Neg Hx    Social History   Tobacco Use  . Smoking status: Never  Smoker  . Smokeless tobacco: Never Used  Substance Use Topics  . Alcohol use: No    Alcohol/week: 0.0 oz  . Drug use: No    ROS Constitutional: Denies fever, chills, weight loss/gain, headaches, insomnia,  night sweats, and change in appetite. Does c/o fatigue. Eyes: Denies redness, blurred vision, diplopia, discharge, itchy, watery eyes.  ENT: Denies discharge, congestion, post nasal drip, epistaxis, sore throat, earache, hearing loss, dental pain, Tinnitus, Vertigo, Sinus pain, snoring.  Cardio: Denies chest pain, palpitations, irregular heartbeat, syncope, dyspnea, diaphoresis, orthopnea, PND, claudication, edema Respiratory: denies cough, dyspnea, DOE, pleurisy, hoarseness, laryngitis, wheezing.  Gastrointestinal: Denies dysphagia, heartburn, reflux, water brash, pain, cramps, nausea, vomiting, bloating, diarrhea, constipation, hematemesis, melena, hematochezia, jaundice, hemorrhoids Genitourinary: Denies dysuria, frequency, urgency, nocturia, hesitancy, discharge, hematuria, flank pain Breast: Breast lumps, nipple discharge, bleeding.  Musculoskeletal: Denies arthralgia, myalgia, stiffness, Jt. Swelling, pain, limp, and strain/sprain. Denies falls. Skin: Denies puritis, rash, hives, warts, acne, eczema, changing in skin lesion Neuro: No weakness, tremor, incoordination, spasms, paresthesia, pain Psychiatric: Denies confusion, memory loss, sensory loss. Denies Depression. Endocrine: Denies change in weight, skin, hair change, nocturia, and paresthesia, diabetic polys, visual blurring, hyper / hypo glycemic episodes.  Heme/Lymph: No excessive bleeding, bruising, enlarged lymph nodes.  Physical Exam  BP 126/80   Pulse 60   Temp (!) 97.1 F (36.2 C)   Resp 16   Ht 5\' 3"  (1.6 m)   Wt 193 lb 9.6 oz (87.8 kg)   BMI 34.29 kg/m   General Appearance: Well nourished, well groomed and in no apparent distress.  Eyes: PERRLA, EOMs, conjunctiva no swelling or erythema, normal fundi and  vessels. Sinuses: No frontal/maxillary tenderness ENT/Mouth: EACs patent / TMs  nl. Nares clear without erythema, swelling, mucoid exudates. Oral hygiene is good. No erythema, swelling, or exudate. Tongue normal, non-obstructing. Tonsils not swollen or erythematous. Hearing normal.  Neck: Supple, thyroid not palpable. No bruits, nodes or JVD. Respiratory: Respiratory effort normal.  BS equal and clear bilateral without rales, rhonci, wheezing or stridor. Cardio: Heart sounds are normal with regular rate and rhythm and no murmurs, rubs or gallops. Peripheral pulses are normal and equal bilaterally without edema. No aortic or femoral bruits. Chest: symmetric with normal excursions and percussion. Breasts: Symmetric, without lumps, nipple discharge, retractions, or fibrocystic changes.  Abdomen: Flat, soft with bowel sounds active. Nontender, no guarding, rebound, hernias, masses, or organomegaly.  Lymphatics: Non tender without lymphadenopathy.  Genitourinary:  Musculoskeletal: Full ROM all peripheral extremities, joint stability, 5/5 strength, and normal gait. Skin: Warm and dry without rashes, lesions, cyanosis, clubbing or  ecchymosis.  Neuro: Cranial nerves intact, reflexes equal bilaterally. Normal muscle tone, no cerebellar symptoms. Sensation intact.  Pysch: Alert and oriented X 3, normal affect, Insight and Judgment appropriate.   Assessment and Plan  1. Annual Preventative Screening Examination  2. Essential hypertension  - EKG 12-Lead - Urinalysis, Routine w reflex microscopic - Microalbumin / creatinine  urine ratio - CBC with Differential/Platelet - COMPLETE METABOLIC PANEL WITH GFR - Magnesium - TSH  3. Hyperlipidemia, mixed  - EKG 12-Lead - Lipid panel - TSH  4. Abnormal glucose  - EKG 12-Lead - Hemoglobin A1c - Insulin, random  5. Vitamin D deficiency  - VITAMIN D 25 Hydroxyl  6. Prediabetes  - EKG 12-Lead - Hemoglobin A1c - Insulin, random  7.  Screening for colorectal cancer  - POC Hemoccult Bld/Stl  8. Screening for ischemic heart disease  - EKG 12-Lead  9. FHx: heart disease  - EKG 12-Lead  10. Medication management  - Urinalysis, Routine w reflex microscopic - Microalbumin / creatinine urine ratio - CBC with Differential/Platelet - COMPLETE METABOLIC PANEL WITH GFR - Magnesium - Lipid panel - TSH - Hemoglobin A1c - Insulin, random - VITAMIN D 25 Hydroxyl       Patient was counseled in prudent diet to achieve/maintain BMI less than 25 for weight control, BP monitoring, regular exercise and medications. Discussed med's effects and SE's. Screening labs and tests as requested with regular follow-up as recommended. Over 40 minutes of exam, counseling, chart review and high complex critical decision making was performed.

## 2018-05-22 ENCOUNTER — Ambulatory Visit (INDEPENDENT_AMBULATORY_CARE_PROVIDER_SITE_OTHER): Payer: PPO | Admitting: Internal Medicine

## 2018-05-22 VITALS — BP 126/80 | HR 60 | Temp 97.1°F | Resp 16 | Ht 63.0 in | Wt 193.6 lb

## 2018-05-22 DIAGNOSIS — Z8249 Family history of ischemic heart disease and other diseases of the circulatory system: Secondary | ICD-10-CM | POA: Diagnosis not present

## 2018-05-22 DIAGNOSIS — R7303 Prediabetes: Secondary | ICD-10-CM | POA: Diagnosis not present

## 2018-05-22 DIAGNOSIS — I10 Essential (primary) hypertension: Secondary | ICD-10-CM | POA: Diagnosis not present

## 2018-05-22 DIAGNOSIS — Z1211 Encounter for screening for malignant neoplasm of colon: Secondary | ICD-10-CM

## 2018-05-22 DIAGNOSIS — E782 Mixed hyperlipidemia: Secondary | ICD-10-CM

## 2018-05-22 DIAGNOSIS — R7309 Other abnormal glucose: Secondary | ICD-10-CM | POA: Diagnosis not present

## 2018-05-22 DIAGNOSIS — Z136 Encounter for screening for cardiovascular disorders: Secondary | ICD-10-CM

## 2018-05-22 DIAGNOSIS — Z0001 Encounter for general adult medical examination with abnormal findings: Secondary | ICD-10-CM | POA: Diagnosis not present

## 2018-05-22 DIAGNOSIS — Z1212 Encounter for screening for malignant neoplasm of rectum: Secondary | ICD-10-CM

## 2018-05-22 DIAGNOSIS — Z79899 Other long term (current) drug therapy: Secondary | ICD-10-CM

## 2018-05-22 DIAGNOSIS — E559 Vitamin D deficiency, unspecified: Secondary | ICD-10-CM

## 2018-05-23 LAB — HEMOGLOBIN A1C
EAG (MMOL/L): 7.3 (calc)
HEMOGLOBIN A1C: 6.2 %{Hb} — AB (ref <5.7)
Mean Plasma Glucose: 131 (calc)

## 2018-05-23 LAB — CBC WITH DIFFERENTIAL/PLATELET
BASOS PCT: 0.8 %
Basophils Absolute: 60 cells/uL (ref 0–200)
EOS ABS: 143 {cells}/uL (ref 15–500)
Eosinophils Relative: 1.9 %
HCT: 39.8 % (ref 35.0–45.0)
HEMOGLOBIN: 12.6 g/dL (ref 11.7–15.5)
Lymphs Abs: 2498 cells/uL (ref 850–3900)
MCH: 26 pg — AB (ref 27.0–33.0)
MCHC: 31.7 g/dL — ABNORMAL LOW (ref 32.0–36.0)
MCV: 82.2 fL (ref 80.0–100.0)
MONOS PCT: 10.1 %
MPV: 9.7 fL (ref 7.5–12.5)
Neutro Abs: 4043 cells/uL (ref 1500–7800)
Neutrophils Relative %: 53.9 %
Platelets: 262 10*3/uL (ref 140–400)
RBC: 4.84 10*6/uL (ref 3.80–5.10)
RDW: 12.5 % (ref 11.0–15.0)
Total Lymphocyte: 33.3 %
WBC: 7.5 10*3/uL (ref 3.8–10.8)
WBCMIX: 758 {cells}/uL (ref 200–950)

## 2018-05-23 LAB — COMPLETE METABOLIC PANEL WITH GFR
AG Ratio: 1.4 (calc) (ref 1.0–2.5)
ALBUMIN MSPROF: 4.3 g/dL (ref 3.6–5.1)
ALKALINE PHOSPHATASE (APISO): 79 U/L (ref 33–130)
ALT: 13 U/L (ref 6–29)
AST: 15 U/L (ref 10–35)
BUN: 12 mg/dL (ref 7–25)
CO2: 31 mmol/L (ref 20–32)
Calcium: 9.5 mg/dL (ref 8.6–10.4)
Chloride: 103 mmol/L (ref 98–110)
Creat: 0.82 mg/dL (ref 0.50–0.99)
GFR, Est African American: 86 mL/min/{1.73_m2} (ref >=60)
GFR, Est Non African American: 74 mL/min/{1.73_m2} (ref >=60)
GLOBULIN: 3.1 g/dL (ref 1.9–3.7)
GLUCOSE: 90 mg/dL (ref 65–99)
Potassium: 3.9 mmol/L (ref 3.5–5.3)
SODIUM: 140 mmol/L (ref 135–146)
Total Bilirubin: 0.6 mg/dL (ref 0.2–1.2)
Total Protein: 7.4 g/dL (ref 6.1–8.1)

## 2018-05-23 LAB — INSULIN, RANDOM: INSULIN: 23.1 u[IU]/mL — AB (ref 2.0–19.6)

## 2018-05-23 LAB — URINALYSIS, ROUTINE W REFLEX MICROSCOPIC
BILIRUBIN URINE: NEGATIVE
Glucose, UA: NEGATIVE
Hgb urine dipstick: NEGATIVE
Ketones, ur: NEGATIVE
LEUKOCYTES UA: NEGATIVE
Nitrite: NEGATIVE
Protein, ur: NEGATIVE
Specific Gravity, Urine: 1.014 (ref 1.001–1.03)
pH: 8 (ref 5.0–8.0)

## 2018-05-23 LAB — LIPID PANEL
Cholesterol: 118 mg/dL (ref <200)
HDL: 40 mg/dL — ABNORMAL LOW (ref >50)
LDL Cholesterol (Calc): 64 mg/dL (calc)
Non-HDL Cholesterol (Calc): 78 mg/dL (calc) (ref <130)
Total CHOL/HDL Ratio: 3 (calc) (ref <5.0)
Triglycerides: 56 mg/dL (ref <150)

## 2018-05-23 LAB — MAGNESIUM: MAGNESIUM: 1.8 mg/dL (ref 1.5–2.5)

## 2018-05-23 LAB — TSH: TSH: 1.18 m[IU]/L (ref 0.40–4.50)

## 2018-05-23 LAB — MICROALBUMIN / CREATININE URINE RATIO
Creatinine, Urine: 77 mg/dL (ref 20–275)
Microalb Creat Ratio: 3 mcg/mg creat (ref <30)
Microalb, Ur: 0.2 mg/dL

## 2018-05-23 LAB — VITAMIN D 25 HYDROXY (VIT D DEFICIENCY, FRACTURES): Vit D, 25-Hydroxy: 56 ng/mL (ref 30–100)

## 2018-05-24 DIAGNOSIS — H52223 Regular astigmatism, bilateral: Secondary | ICD-10-CM | POA: Diagnosis not present

## 2018-05-24 DIAGNOSIS — Z9849 Cataract extraction status, unspecified eye: Secondary | ICD-10-CM | POA: Diagnosis not present

## 2018-05-24 DIAGNOSIS — H18413 Arcus senilis, bilateral: Secondary | ICD-10-CM | POA: Diagnosis not present

## 2018-05-24 DIAGNOSIS — H11153 Pinguecula, bilateral: Secondary | ICD-10-CM | POA: Diagnosis not present

## 2018-05-24 DIAGNOSIS — H04123 Dry eye syndrome of bilateral lacrimal glands: Secondary | ICD-10-CM | POA: Diagnosis not present

## 2018-05-24 DIAGNOSIS — H0100B Unspecified blepharitis left eye, upper and lower eyelids: Secondary | ICD-10-CM | POA: Diagnosis not present

## 2018-05-24 DIAGNOSIS — H11823 Conjunctivochalasis, bilateral: Secondary | ICD-10-CM | POA: Diagnosis not present

## 2018-05-24 DIAGNOSIS — H0100A Unspecified blepharitis right eye, upper and lower eyelids: Secondary | ICD-10-CM | POA: Diagnosis not present

## 2018-05-24 DIAGNOSIS — Z961 Presence of intraocular lens: Secondary | ICD-10-CM | POA: Diagnosis not present

## 2018-05-24 DIAGNOSIS — H3589 Other specified retinal disorders: Secondary | ICD-10-CM | POA: Diagnosis not present

## 2018-05-24 DIAGNOSIS — H40023 Open angle with borderline findings, high risk, bilateral: Secondary | ICD-10-CM | POA: Diagnosis not present

## 2018-06-06 ENCOUNTER — Other Ambulatory Visit: Payer: Self-pay

## 2018-06-06 DIAGNOSIS — Z1211 Encounter for screening for malignant neoplasm of colon: Secondary | ICD-10-CM | POA: Diagnosis not present

## 2018-06-06 DIAGNOSIS — Z1212 Encounter for screening for malignant neoplasm of rectum: Principal | ICD-10-CM

## 2018-06-06 LAB — POC HEMOCCULT BLD/STL (HOME/3-CARD/SCREEN)
Card #2 Fecal Occult Blod, POC: NEGATIVE
Card #3 Fecal Occult Blood, POC: NEGATIVE
FECAL OCCULT BLD: NEGATIVE

## 2018-08-28 ENCOUNTER — Other Ambulatory Visit: Payer: Self-pay | Admitting: *Deleted

## 2018-08-28 ENCOUNTER — Ambulatory Visit: Payer: Self-pay | Admitting: Adult Health

## 2018-08-28 ENCOUNTER — Ambulatory Visit (INDEPENDENT_AMBULATORY_CARE_PROVIDER_SITE_OTHER): Payer: PPO | Admitting: Internal Medicine

## 2018-08-28 ENCOUNTER — Encounter: Payer: Self-pay | Admitting: Internal Medicine

## 2018-08-28 VITALS — BP 136/86 | HR 60 | Temp 97.4°F | Resp 16 | Ht 63.0 in | Wt 190.0 lb

## 2018-08-28 DIAGNOSIS — Z8249 Family history of ischemic heart disease and other diseases of the circulatory system: Secondary | ICD-10-CM | POA: Insufficient documentation

## 2018-08-28 DIAGNOSIS — R7309 Other abnormal glucose: Secondary | ICD-10-CM

## 2018-08-28 DIAGNOSIS — I10 Essential (primary) hypertension: Secondary | ICD-10-CM

## 2018-08-28 DIAGNOSIS — Z79899 Other long term (current) drug therapy: Secondary | ICD-10-CM | POA: Diagnosis not present

## 2018-08-28 DIAGNOSIS — E782 Mixed hyperlipidemia: Secondary | ICD-10-CM

## 2018-08-28 DIAGNOSIS — R7303 Prediabetes: Secondary | ICD-10-CM | POA: Diagnosis not present

## 2018-08-28 DIAGNOSIS — E559 Vitamin D deficiency, unspecified: Secondary | ICD-10-CM

## 2018-08-28 DIAGNOSIS — M25511 Pain in right shoulder: Secondary | ICD-10-CM

## 2018-08-28 DIAGNOSIS — Z23 Encounter for immunization: Secondary | ICD-10-CM

## 2018-08-28 DIAGNOSIS — K219 Gastro-esophageal reflux disease without esophagitis: Secondary | ICD-10-CM

## 2018-08-28 MED ORDER — ATORVASTATIN CALCIUM 40 MG PO TABS: ORAL_TABLET | ORAL | 1 refills | Status: DC

## 2018-08-28 MED ORDER — PREDNISONE 20 MG PO TABS: ORAL_TABLET | ORAL | 0 refills | Status: DC

## 2018-08-28 NOTE — Patient Instructions (Signed)

## 2018-08-28 NOTE — Progress Notes (Signed)
This very nice 67 y.o. [name] presents for 3 month follow up with HTN, HLD, Pre-Diabetes and Vitamin D Deficiency. She does c/o Rt shoulder pain w/o hx/o injury. Patient has GERD controlled with prudent diet.     Patient is treated for HTN (2002)  & BP has been controlled at home. Today's BP is at goal - 136/86. Patient has CKD2. Patient has had no complaints of any cardiac type chest pain, palpitations, dyspnea / orthopnea / PND, dizziness, claudication, or dependent edema.     In 2011, patient was hospitalized w/ARF due to NSAID use and recovered . In 2016, she was hospitalized w/CAP. Dehydration  & ARF requiring dialysis and recovered back to her baseline CKD2 (GFR 83).       Hyperlipidemia is controlled with diet & meds. Patient denies myalgias or other med SE's. Last Lipids were at goal: Lab Results  Component Value Date   CHOL 118 05/22/2018   HDL 40 (L) 05/22/2018   LDLCALC 64 05/22/2018   LDLDIRECT 160.1 07/18/2009   TRIG 56 05/22/2018   CHOLHDL 3.0 05/22/2018      Also, the patient has history of PreDiabetes (A1c 6.2%/2012)  and has had no symptoms of reactive hypoglycemia, diabetic polys, paresthesias or visual blurring.  Last A1c was not at goal: Lab Results  Component Value Date   HGBA1C 6.2 (H) 05/22/2018      Further, the patient also has history of Vitamin D Deficiency ("12"/2012)  and supplements vitamin D without any suspected side-effects. Last vitamin D was near goal (70-100): Lab Results  Component Value Date   VD25OH 56 05/22/2018   Current Outpatient Medications on File Prior to Visit  Medication Sig  . acetaminophen (TYLENOL) 500 MG tablet Take 500 mg by mouth every 6 (six) hours as needed for mild pain or headache.   Marland Kitchen aspirin EC 81 MG tablet Take 81 mg by mouth daily.  . Cholecalciferol (VITAMIN D PO) Take 5,000 Units by mouth daily.  Marland Kitchen losartan-hydrochlorothiazide (HYZAAR) 100-25 MG tablet Take 1/2 to 1 tablet daily for BP  . Magnesium 400 MG TABS Take 1  tablet by mouth daily.  . potassium chloride SA (K-DUR,KLOR-CON) 20 MEQ tablet TAKE 1 TABLET BY MOUTH DAILY   No current facility-administered medications on file prior to visit.    Allergies  Allergen Reactions  . Ace Inhibitors   . Naproxen     REACTION: renal failure  . Norvasc [Amlodipine Besylate]     edema  . Nsaids    PMHx:   Past Medical History:  Diagnosis Date  . Hyperlipidemia   . Hypertension   . Prediabetes   . Vitamin D deficiency    Immunization History  Administered Date(s) Administered  . Influenza, High Dose Seasonal PF 08/28/2018  . Influenza,inj,quad, With Preservative 10/25/2016  . Influenza,trivalent, recombinat, inj, PF 09/07/2015  . Influenza-Unspecified 08/30/2015  . Pneumococcal Conjugate-13 01/30/2018  . Pneumococcal Polysaccharide-23 10/29/2015  . Td 02/19/2000   Past Surgical History:  Procedure Laterality Date  . ABDOMINAL HYSTERECTOMY    . EYE SURGERY Left 2017   Cataract sugery left eye   FHx:    Reviewed / unchanged  SHx:    Reviewed / unchanged   Systems Review:  Constitutional: Denies fever, chills, wt changes, headaches, insomnia, fatigue, night sweats, change in appetite. Eyes: Denies redness, blurred vision, diplopia, discharge, itchy, watery eyes.  ENT: Denies discharge, congestion, post nasal drip, epistaxis, sore throat, earache, hearing loss, dental pain, tinnitus, vertigo,  sinus pain, snoring.  CV: Denies chest pain, palpitations, irregular heartbeat, syncope, dyspnea, diaphoresis, orthopnea, PND, claudication or edema. Respiratory: denies cough, dyspnea, DOE, pleurisy, hoarseness, laryngitis, wheezing.  Gastrointestinal: Denies dysphagia, odynophagia, heartburn, reflux, water brash, abdominal pain or cramps, nausea, vomiting, bloating, diarrhea, constipation, hematemesis, melena, hematochezia  or hemorrhoids. Genitourinary: Denies dysuria, frequency, urgency, nocturia, hesitancy, discharge, hematuria or flank  pain. Musculoskeletal: Denies arthralgias, myalgias, stiffness, jt. swelling, pain, limping or strain/sprain.  Skin: Denies pruritus, rash, hives, warts, acne, eczema or change in skin lesion(s). Neuro: No weakness, tremor, incoordination, spasms, paresthesia or pain. Psychiatric: Denies confusion, memory loss or sensory loss. Endo: Denies change in weight, skin or hair change.  Heme/Lymph: No excessive bleeding, bruising or enlarged lymph nodes.  Physical Exam  BP 136/86   Pulse 60   Temp (!) 97.4 F (36.3 C)   Resp 16   Ht 5\' 3"  (1.6 m)   Wt 190 lb (86.2 kg)   BMI 33.66 kg/m   Appears  well nourished, well groomed  and in no distress.  Eyes: PERRLA, EOMs, conjunctiva no swelling or erythema. Sinuses: No frontal/maxillary tenderness ENT/Mouth: EAC's clear, TM's nl w/o erythema, bulging. Nares clear w/o erythema, swelling, exudates. Oropharynx clear without erythema or exudates. Oral hygiene is good. Tongue normal, non obstructing. Hearing intact.  Neck: Supple. Thyroid not palpable. Car 2+/2+ without bruits, nodes or JVD. Chest: Respirations nl with BS clear & equal w/o rales, rhonchi, wheezing or stridor.  Cor: Heart sounds normal w/ regular rate and rhythm without sig. murmurs, gallops, clicks or rubs. Peripheral pulses normal and equal  without edema.  Abdomen: Soft & bowel sounds normal. Non-tender w/o guarding, rebound, hernias, masses or organomegaly.  Lymphatics: Unremarkable.  Musculoskeletal: Tender about the Rt shoulder apex & anterior jt line with decrreased ROM . Normal gait.  Skin: Warm, dry without exposed rashes, lesions or ecchymosis apparent.  Neuro: Cranial nerves intact, reflexes equal bilaterally. Sensory-motor testing grossly intact. Tendon reflexes grossly intact.  Pysch: Alert & oriented x 3.  Insight and judgement nl & appropriate. No ideations.  Assessment and Plan:  1. Essential hypertension  - Continue medication, monitor blood pressure at home.  -  Continue DASH diet.  Reminder to go to the ER if any CP,  SOB, nausea, dizziness, severe HA, changes vision/speech.  - CBC with Differential/Platelet - COMPLETE METABOLIC PANEL WITH GFR - Magnesium - TSH  2. Hyperlipidemia, mixed  - Continue diet/meds, exercise,& lifestyle modifications.  - Continue monitor periodic cholesterol/liver & renal functions   - Lipid panel - TSH  3. Abnormal glucose  - Continue diet, exercise, lifestyle modifications.  - Monitor appropriate labs.  - Hemoglobin A1c - Insulin, random  4. Vitamin D deficiency  - Continue supplementation.  - VITAMIN D 25 Hydroxyl  5. Prediabetes  - Hemoglobin A1c - Insulin, random  6. Gastroesophageal reflux disease - CBC with Differential/Platelet - COMPLETE METABOLIC PANEL WITH GFR - Magnesium - Lipid panel - TSH - Hemoglobin A1c - Insulin, random - VITAMIN D 25 Hydroxyl        Discussed  regular exercise, BP monitoring, weight control to achieve/maintain BMI less than 25 and discussed med and SE's. Recommended labs to assess and monitor clinical status with further disposition pending results of labs. Over 30 minutes of exam, counseling, chart review was performed.

## 2018-08-29 LAB — COMPLETE METABOLIC PANEL WITH GFR
AG RATIO: 1.3 (calc) (ref 1.0–2.5)
ALBUMIN MSPROF: 4.1 g/dL (ref 3.6–5.1)
ALT: 12 U/L (ref 6–29)
AST: 15 U/L (ref 10–35)
Alkaline phosphatase (APISO): 64 U/L (ref 33–130)
BUN: 12 mg/dL (ref 7–25)
CHLORIDE: 104 mmol/L (ref 98–110)
CO2: 30 mmol/L (ref 20–32)
Calcium: 9.6 mg/dL (ref 8.6–10.4)
Creat: 0.86 mg/dL (ref 0.50–0.99)
GFR, EST AFRICAN AMERICAN: 80 mL/min/{1.73_m2} (ref >=60)
GFR, Est Non African American: 69 mL/min/{1.73_m2} (ref >=60)
Globulin: 3.1 g/dL (calc) (ref 1.9–3.7)
Glucose, Bld: 94 mg/dL (ref 65–99)
POTASSIUM: 4.2 mmol/L (ref 3.5–5.3)
SODIUM: 141 mmol/L (ref 135–146)
TOTAL PROTEIN: 7.2 g/dL (ref 6.1–8.1)
Total Bilirubin: 0.5 mg/dL (ref 0.2–1.2)

## 2018-08-29 LAB — CBC WITH DIFFERENTIAL/PLATELET
BASOS PCT: 0.9 %
Basophils Absolute: 61 cells/uL (ref 0–200)
EOS ABS: 129 {cells}/uL (ref 15–500)
Eosinophils Relative: 1.9 %
HCT: 36.9 % (ref 35.0–45.0)
HEMOGLOBIN: 12 g/dL (ref 11.7–15.5)
Lymphs Abs: 2557 cells/uL (ref 850–3900)
MCH: 26.5 pg — ABNORMAL LOW (ref 27.0–33.0)
MCHC: 32.5 g/dL (ref 32.0–36.0)
MCV: 81.5 fL (ref 80.0–100.0)
MPV: 9.8 fL (ref 7.5–12.5)
Monocytes Relative: 10 %
Neutro Abs: 3373 cells/uL (ref 1500–7800)
Neutrophils Relative %: 49.6 %
PLATELETS: 286 10*3/uL (ref 140–400)
RBC: 4.53 10*6/uL (ref 3.80–5.10)
RDW: 13.1 % (ref 11.0–15.0)
TOTAL LYMPHOCYTE: 37.6 %
WBC: 6.8 10*3/uL (ref 3.8–10.8)
WBCMIX: 680 {cells}/uL (ref 200–950)

## 2018-08-29 LAB — LIPID PANEL
Cholesterol: 179 mg/dL (ref <200)
HDL: 43 mg/dL — ABNORMAL LOW (ref >50)
LDL Cholesterol (Calc): 117 mg/dL (calc) — ABNORMAL HIGH
Non-HDL Cholesterol (Calc): 136 mg/dL (calc) — ABNORMAL HIGH (ref <130)
Total CHOL/HDL Ratio: 4.2 (calc) (ref <5.0)
Triglycerides: 89 mg/dL (ref <150)

## 2018-08-29 LAB — TSH: TSH: 1.61 m[IU]/L (ref 0.40–4.50)

## 2018-08-29 LAB — HEMOGLOBIN A1C
EAG (MMOL/L): 7 (calc)
HEMOGLOBIN A1C: 6 %{Hb} — AB (ref <5.7)
Mean Plasma Glucose: 126 (calc)

## 2018-08-29 LAB — INSULIN, RANDOM: Insulin: 18.9 u[IU]/mL (ref 2.0–19.6)

## 2018-08-29 LAB — VITAMIN D 25 HYDROXY (VIT D DEFICIENCY, FRACTURES): VIT D 25 HYDROXY: 54 ng/mL (ref 30–100)

## 2018-08-29 LAB — MAGNESIUM: Magnesium: 1.9 mg/dL (ref 1.5–2.5)

## 2018-10-11 ENCOUNTER — Other Ambulatory Visit: Payer: Self-pay

## 2018-10-11 DIAGNOSIS — I10 Essential (primary) hypertension: Secondary | ICD-10-CM

## 2018-10-11 MED ORDER — LOSARTAN POTASSIUM-HCTZ 100-25 MG PO TABS: ORAL_TABLET | ORAL | 1 refills | Status: DC

## 2018-12-04 ENCOUNTER — Other Ambulatory Visit: Payer: Self-pay | Admitting: Internal Medicine

## 2018-12-04 DIAGNOSIS — Z1231 Encounter for screening mammogram for malignant neoplasm of breast: Secondary | ICD-10-CM

## 2018-12-17 ENCOUNTER — Encounter: Payer: Self-pay | Admitting: Internal Medicine

## 2018-12-17 NOTE — Progress Notes (Signed)
R  E  S  C  H  E  D  U  L  E D                                                                                                                                                                                                                                                                                                                                                                                                                 This very nice 68 y.o. [name]  presents for 6 month follow up with HTN, HLD, Pre-Diabetes and Vitamin D Deficiency.      Patient is treated for HTN (2002) & BP has been controlled at home. Today's  . Patient has had no complaints of any cardiac type chest pain, palpitations, dyspnea / orthopnea / PND, dizziness, claudication, or dependent edema. Patient was hospitalized in 2011 with ARF attributed to NSAID use and again in 2016 when admitted with CAP, she required dialysis for AKI and recovered to her baseline CKD2.      Hyperlipidemia is controlled with diet & meds. Patient denies myalgias or other med SE's. Last Lipids were not at goal: Lab Results  Component Value Date   CHOL 179 08/28/2018   HDL 43 (L) 08/28/2018   LDLCALC 117 (H) 08/28/2018   LDLDIRECT 160.1 07/18/2009   TRIG 89 08/28/2018   CHOLHDL 4.2 08/28/2018      Also, the patient has history of PreDiabetes (A1c 6.2% / 2012) and  has had no symptoms of reactive hypoglycemia, diabetic polys, paresthesias or visual blurring.  Last A1c was  Lab Results  Component Value Date   HGBA1C 6.0 (H) 08/28/2018      Further,  the patient also has history of Vitamin D Deficiency ("12" / 2012)   and supplements vitamin D without any suspected side-effects. Last vitamin D was  Near goal (70-100): Lab Results  Component Value Date   VD25OH 54 08/28/2018

## 2018-12-18 ENCOUNTER — Ambulatory Visit: Payer: Self-pay | Admitting: Internal Medicine

## 2018-12-24 ENCOUNTER — Encounter: Payer: Self-pay | Admitting: Internal Medicine

## 2018-12-24 NOTE — Progress Notes (Signed)
This very nice 69 y.o. [name] presents for 6 month follow up with HTN, HLD, Pre-Diabetes and Vitamin D Deficiency.      Patient is treated for HTN & BP has been controlled at home. Today's BP: 140/68. Patient was hospitalized in 2011 with ARF attributed to NSAID use and again in 2016 when admitted with CAP, she required dialysis for AKI and recovered to her baseline CKD2.   Patient has had no complaints of any cardiac type chest pain, palpitations, dyspnea / orthopnea / PND, dizziness, claudication, or dependent edema.     Hyperlipidemia is controlled with diet & meds. Patient denies myalgias or other med SE's. Last Lipids were not at goal:  Lab Results  Component Value Date   CHOL 179 08/28/2018   HDL 43 (L) 08/28/2018   LDLCALC 117 (H) 08/28/2018   LDLDIRECT 160.1 07/18/2009   TRIG 89 08/28/2018   CHOLHDL 4.2 08/28/2018      Also, the patient has history of PreDiabetes  (A1c 6.2% / 2012)  and has had no symptoms of reactive hypoglycemia, diabetic polys, paresthesias or visual blurring.  Last A1c was not at goal: Lab Results  Component Value Date   HGBA1C 6.0 (H) 08/28/2018      Further, the patient also has history of Vitamin D Deficiency  ("12" / 2012) and supplements vitamin D without any suspected side-effects. Last vitamin D was near goal (70-100):   Lab Results  Component Value Date   VD25OH 54 08/28/2018   Current Outpatient Medications on File Prior to Visit  Medication Sig  . acetaminophen (TYLENOL) 500 MG tablet Take 500 mg by mouth every 6 (six) hours as needed for mild pain or headache.   Marland Kitchen aspirin EC 81 MG tablet Take 81 mg by mouth daily.  Marland Kitchen atorvastatin (LIPITOR) 40 MG tablet TAKE 1 TABLET(40 MG) BY MOUTH DAILY  . Cholecalciferol (VITAMIN D PO) Take 5,000 Units by mouth daily.  Marland Kitchen losartan-hydrochlorothiazide (HYZAAR) 100-25 MG tablet Take 1/2 to 1 tablet daily for BP  . Magnesium 400 MG TABS Take 1 tablet by mouth daily.  . potassium chloride SA (K-DUR,KLOR-CON)  20 MEQ tablet TAKE 1 TABLET BY MOUTH DAILY   No current facility-administered medications on file prior to visit.    Allergies  Allergen Reactions  . Ace Inhibitors   . Naproxen     REACTION: renal failure  . Norvasc [Amlodipine Besylate]     edema  . Nsaids    PMHx:   Past Medical History:  Diagnosis Date  . Hyperlipidemia   . Hypertension   . Prediabetes   . Vitamin D deficiency    Immunization History  Administered Date(s) Administered  . Influenza, High Dose Seasonal PF 08/28/2018  . Influenza,inj,quad, With Preservative 10/25/2016  . Influenza,trivalent, recombinat, inj, PF 09/07/2015  . Influenza-Unspecified 08/30/2015  . Pneumococcal Conjugate-13 01/30/2018  . Pneumococcal Polysaccharide-23 10/29/2015  . Td 02/19/2000   Past Surgical History:  Procedure Laterality Date  . ABDOMINAL HYSTERECTOMY    . EYE SURGERY Left 2017   Cataract sugery left eye   FHx:    Reviewed / unchanged  SHx:    Reviewed / unchanged   Systems Review:  Constitutional: Denies fever, chills, wt changes, headaches, insomnia, fatigue, night sweats, change in appetite. Eyes: Denies redness, blurred vision, diplopia, discharge, itchy, watery eyes.  ENT: Denies discharge, congestion, post nasal drip, epistaxis, sore throat, earache, hearing loss, dental pain, tinnitus, vertigo, sinus pain, snoring.  CV: Denies chest pain,  palpitations, irregular heartbeat, syncope, dyspnea, diaphoresis, orthopnea, PND, claudication or edema. Respiratory: denies cough, dyspnea, DOE, pleurisy, hoarseness, laryngitis, wheezing.  Gastrointestinal: Denies dysphagia, odynophagia, heartburn, reflux, water brash, abdominal pain or cramps, nausea, vomiting, bloating, diarrhea, constipation, hematemesis, melena, hematochezia  or hemorrhoids. Genitourinary: Denies dysuria, frequency, urgency, nocturia, hesitancy, discharge, hematuria or flank pain. Musculoskeletal: Denies arthralgias, myalgias, stiffness, jt. swelling,  pain, limping or strain/sprain.  Skin: Denies pruritus, rash, hives, warts, acne, eczema or change in skin lesion(s). Neuro: No weakness, tremor, incoordination, spasms, paresthesia or pain. Psychiatric: Denies confusion, memory loss or sensory loss. Endo: Denies change in weight, skin or hair change.  Heme/Lymph: No excessive bleeding, bruising or enlarged lymph nodes.  Physical Exam  BP 140/68   Pulse 62   Temp 97.7 F (36.5 C)   Ht 5\' 3"  (1.6 m)   Wt 191 lb 6.4 oz (86.8 kg)   SpO2 99%   BMI 33.90 kg/m   Appears  well nourished, well groomed  and in no distress.  Eyes: PERRLA, EOMs, conjunctiva no swelling or erythema. Sinuses: No frontal/maxillary tenderness ENT/Mouth: EAC's clear, TM's nl w/o erythema, bulging. Nares clear w/o erythema, swelling, exudates. Oropharynx clear without erythema or exudates. Oral hygiene is good. Tongue normal, non obstructing. Hearing intact.  Neck: Supple. Thyroid not palpable. Car 2+/2+ without bruits, nodes or JVD. Chest: Respirations nl with BS clear & equal w/o rales, rhonchi, wheezing or stridor.  Cor: Heart sounds normal w/ regular rate and rhythm without sig. murmurs, gallops, clicks or rubs. Peripheral pulses normal and equal  without edema.  Abdomen: Soft & bowel sounds normal. Non-tender w/o guarding, rebound, hernias, masses or organomegaly.  Lymphatics: Unremarkable.  Musculoskeletal: Full ROM all peripheral extremities, joint stability, 5/5 strength and normal gait.  Skin: Warm, dry without exposed rashes, lesions or ecchymosis apparent.  Neuro: Cranial nerves intact, reflexes equal bilaterally. Sensory-motor testing grossly intact. Tendon reflexes grossly intact.  Pysch: Alert & oriented x 3.  Insight and judgement nl & appropriate. No ideations.  Assessment and Plan:  1. Essential hypertension  - Continue medication, monitor blood pressure at home.  - Continue DASH diet.  Reminder to go to the ER if any CP,  SOB, nausea,  dizziness, severe HA, changes vision/speech.  - CBC with Differential/Platelet - COMPLETE METABOLIC PANEL WITH GFR - Magnesium - TSH  2. Hyperlipidemia, mixed  - Continue diet/meds, exercise,& lifestyle modifications.  - Continue monitor periodic cholesterol/liver & renal functions   - Lipid panel - TSH  3. Abnormal glucose  - Continue diet, exercise  - Lifestyle modifications.  - Monitor appropriate labs.  - Hemoglobin A1c - Insulin, random  4. Vitamin D deficiency  - Continue supplementation.  - VITAMIN D 25 Hydroxyl  5. Prediabetes  - Hemoglobin A1c - Insulin, random  6. Gastroesophageal reflux disease  - CBC with Differential/Platelet  7. Medication management  - CBC with Differential/Platelet - COMPLETE METABOLIC PANEL WITH GFR - Magnesium - Lipid panel - TSH - Hemoglobin A1c - Insulin, random - VITAMIN D 25 Hydroxyl        Discussed  regular exercise, BP monitoring, weight control to achieve/maintain BMI less than 25 and discussed med and SE's. Recommended labs to assess and monitor clinical status with further disposition pending results of labs. Over 30 minutes of exam, counseling, chart review was performed.

## 2018-12-24 NOTE — Patient Instructions (Signed)

## 2018-12-25 ENCOUNTER — Ambulatory Visit (INDEPENDENT_AMBULATORY_CARE_PROVIDER_SITE_OTHER): Payer: PPO | Admitting: Internal Medicine

## 2018-12-25 ENCOUNTER — Ambulatory Visit: Payer: Self-pay | Admitting: Internal Medicine

## 2018-12-25 ENCOUNTER — Ambulatory Visit: Payer: Self-pay | Admitting: Adult Health

## 2018-12-25 ENCOUNTER — Encounter: Payer: Self-pay | Admitting: Internal Medicine

## 2018-12-25 VITALS — BP 140/68 | HR 62 | Temp 97.7°F | Ht 63.0 in | Wt 191.4 lb

## 2018-12-25 DIAGNOSIS — E559 Vitamin D deficiency, unspecified: Secondary | ICD-10-CM

## 2018-12-25 DIAGNOSIS — I10 Essential (primary) hypertension: Secondary | ICD-10-CM | POA: Diagnosis not present

## 2018-12-25 DIAGNOSIS — K219 Gastro-esophageal reflux disease without esophagitis: Secondary | ICD-10-CM | POA: Diagnosis not present

## 2018-12-25 DIAGNOSIS — Z79899 Other long term (current) drug therapy: Secondary | ICD-10-CM | POA: Diagnosis not present

## 2018-12-25 DIAGNOSIS — E782 Mixed hyperlipidemia: Secondary | ICD-10-CM

## 2018-12-25 DIAGNOSIS — R7303 Prediabetes: Secondary | ICD-10-CM | POA: Diagnosis not present

## 2018-12-25 DIAGNOSIS — R7309 Other abnormal glucose: Secondary | ICD-10-CM

## 2018-12-26 LAB — COMPLETE METABOLIC PANEL WITH GFR
AG RATIO: 1.3 (calc) (ref 1.0–2.5)
ALT: 11 U/L (ref 6–29)
AST: 15 U/L (ref 10–35)
Albumin: 4.2 g/dL (ref 3.6–5.1)
Alkaline phosphatase (APISO): 59 U/L (ref 33–130)
BILIRUBIN TOTAL: 0.6 mg/dL (ref 0.2–1.2)
BUN: 13 mg/dL (ref 7–25)
CALCIUM: 9.2 mg/dL (ref 8.6–10.4)
CHLORIDE: 107 mmol/L (ref 98–110)
CO2: 27 mmol/L (ref 20–32)
Creat: 0.82 mg/dL (ref 0.50–0.99)
GFR, Est African American: 85 mL/min/{1.73_m2} (ref >=60)
GFR, Est Non African American: 74 mL/min/{1.73_m2} (ref >=60)
GLUCOSE: 93 mg/dL (ref 65–99)
Globulin: 3.2 g/dL (calc) (ref 1.9–3.7)
POTASSIUM: 3.7 mmol/L (ref 3.5–5.3)
Sodium: 143 mmol/L (ref 135–146)
Total Protein: 7.4 g/dL (ref 6.1–8.1)

## 2018-12-26 LAB — CBC WITH DIFFERENTIAL/PLATELET
Absolute Monocytes: 818 cells/uL (ref 200–950)
BASOS ABS: 61 {cells}/uL (ref 0–200)
Basophils Relative: 0.6 %
EOS PCT: 0.8 %
Eosinophils Absolute: 81 cells/uL (ref 15–500)
HCT: 37 % (ref 35.0–45.0)
Hemoglobin: 12 g/dL (ref 11.7–15.5)
LYMPHS ABS: 3575 {cells}/uL (ref 850–3900)
MCH: 26.6 pg — ABNORMAL LOW (ref 27.0–33.0)
MCHC: 32.4 g/dL (ref 32.0–36.0)
MCV: 82 fL (ref 80.0–100.0)
MONOS PCT: 8.1 %
MPV: 10.1 fL (ref 7.5–12.5)
NEUTROS PCT: 55.1 %
Neutro Abs: 5565 cells/uL (ref 1500–7800)
PLATELETS: 249 10*3/uL (ref 140–400)
RBC: 4.51 10*6/uL (ref 3.80–5.10)
RDW: 13.9 % (ref 11.0–15.0)
Total Lymphocyte: 35.4 %
WBC: 10.1 10*3/uL (ref 3.8–10.8)

## 2018-12-26 LAB — LIPID PANEL
CHOLESTEROL: 145 mg/dL (ref <200)
HDL: 46 mg/dL — ABNORMAL LOW (ref >50)
LDL Cholesterol (Calc): 81 mg/dL (calc)
Non-HDL Cholesterol (Calc): 99 mg/dL (calc) (ref <130)
Total CHOL/HDL Ratio: 3.2 (calc) (ref <5.0)
Triglycerides: 92 mg/dL (ref <150)

## 2018-12-26 LAB — HEMOGLOBIN A1C
Hgb A1c MFr Bld: 6.4 % of total Hgb — ABNORMAL HIGH (ref <5.7)
MEAN PLASMA GLUCOSE: 137 (calc)
eAG (mmol/L): 7.6 (calc)

## 2018-12-26 LAB — VITAMIN D 25 HYDROXY (VIT D DEFICIENCY, FRACTURES): Vit D, 25-Hydroxy: 47 ng/mL (ref 30–100)

## 2018-12-26 LAB — INSULIN, RANDOM: Insulin: 13.4 u[IU]/mL (ref 2.0–19.6)

## 2018-12-26 LAB — MAGNESIUM: Magnesium: 1.9 mg/dL (ref 1.5–2.5)

## 2018-12-26 LAB — TSH: TSH: 1.18 mIU/L (ref 0.40–4.50)

## 2019-01-08 ENCOUNTER — Ambulatory Visit
Admission: RE | Admit: 2019-01-08 | Discharge: 2019-01-08 | Disposition: A | Discharge status: Home / Self Care | Payer: PPO | Source: Ambulatory Visit | Attending: Internal Medicine | Admitting: Internal Medicine

## 2019-01-08 DIAGNOSIS — Z1231 Encounter for screening mammogram for malignant neoplasm of breast: Secondary | ICD-10-CM

## 2019-01-10 ENCOUNTER — Other Ambulatory Visit: Payer: Self-pay | Admitting: Internal Medicine

## 2019-01-10 DIAGNOSIS — R928 Other abnormal and inconclusive findings on diagnostic imaging of breast: Secondary | ICD-10-CM

## 2019-01-22 ENCOUNTER — Ambulatory Visit: Payer: PPO

## 2019-01-22 ENCOUNTER — Ambulatory Visit
Admission: RE | Admit: 2019-01-22 | Discharge: 2019-01-22 | Disposition: A | Discharge status: Home / Self Care | Payer: PPO | Source: Ambulatory Visit | Attending: Internal Medicine | Admitting: Internal Medicine

## 2019-01-22 DIAGNOSIS — R928 Other abnormal and inconclusive findings on diagnostic imaging of breast: Secondary | ICD-10-CM

## 2019-01-22 DIAGNOSIS — R922 Inconclusive mammogram: Secondary | ICD-10-CM | POA: Diagnosis not present

## 2019-02-01 ENCOUNTER — Ambulatory Visit: Payer: Self-pay | Admitting: Physician Assistant

## 2019-04-05 NOTE — Progress Notes (Deleted)
WELCOME TO MEDICARE VISIT AND 3 MONTH FOLLOW UP  Assessment:    Encounter for Medicare annual wellness exam 1 year  Essential hypertension - continue medications, DASH diet, exercise and monitor at home. Call if greater than 130/80.  -     CBC with Differential/Platelet -     BASIC METABOLIC PANEL WITH GFR -     Hepatic function panel -     TSH  Mixed hyperlipidemia -continue medications, check lipids, decrease fatty foods, increase activity.  -     Lipid panel  Chronic kidney disease (CKD) stage 2 Increase fluids, avoid NSAIDS, monitor sugars, will monitor -     BASIC METABOLIC PANEL WITH GFR  SYNDROME, NEPHROTIC W/MINIMAL CHANGE LESION Will monitor -     BASIC METABOLIC PANEL WITH GFR  Medication management  Vitamin D deficiency Continue supplement  Other abnormal glucose -     Hemoglobin A1c  Need for vaccination with 13-polyvalent pneumococcal conjugate vaccine -     Pneumococcal conjugate vaccine 13-valent IM    Over 30 minutes of exam, counseling, chart review, and critical decision making was performed Future Appointments  Date Time Provider [place]  04/09/2019  3:45 PM Vicie Mutters, PA-C [facility] None  07/16/2019 11:00 AM Unk Pinto, MD [facility] None     Plan:   During the course of the visit the patient was educated and counseled about appropriate screening and preventive services including:    Pneumococcal vaccine   Influenza vaccine  Td vaccine  Prevnar 13  Screening electrocardiogram  Screening mammography  Bone densitometry screening  Colorectal cancer screening  Diabetes screening  Glaucoma screening  Nutrition counseling   Advanced directives: given info/requested copies   Subjective:   Lydia Edwards is a 68 y.o. female who presents for Medicare Annual Wellness Visit and 3 month follow up on hypertension, prediabetes, hyperlipidemia, vitamin D def.   She works for [employer], normally [place]- Friday. She  states that they want her to start working every other weekend, however her husband has severe COPD and back pain. She has someone that can help him during the week but she needs to be with him on the weekends to take him to appointments, help him with medications, and be there in case of a flare.   Her blood pressure has been controlled at home, today their BP is   She does workout.  She does a lot of walking.  She walks at least a mile daily. She denies chest pain, shortness of breath, dizziness.   She is on cholesterol medication and denies myalgias. Her cholesterol is at goal. The cholesterol last visit was:   Lab Results  Component Value Date   CHOL 145 12/25/2018   HDL 46 (L) 12/25/2018   LDLCALC 81 12/25/2018   LDLDIRECT 160.1 07/18/2009   TRIG 92 12/25/2018   CHOLHDL 3.2 12/25/2018   She has been working on diet and exercise for prediabetes, and denies foot ulcerations, hyperglycemia, hypoglycemia , increased appetite, nausea, paresthesia of the feet, polydipsia, polyuria, visual disturbances, vomiting and weight loss. Last A1C in the office was:  Lab Results  Component Value Date   HGBA1C 6.4 (H) 12/25/2018   Patient is on Vitamin D supplement. Lab Results  Component Value Date   VD25OH 47 12/25/2018     BMI is There is no height or weight on file to calculate BMI., she is working on diet and exercise. Wt Readings from Last 3 Encounters:  12/25/18 191 lb 6.4 oz (86.8  kg)  08/28/18 190 lb (86.2 kg)  05/22/18 193 lb 9.6 oz (87.8 kg)   She has a history of nephrotic syndrome in the past associated with a pneumonia, saw Dr. Lorrene Reid for that, has been discharged and we are still monitoring kidney functions.  Lab Results  Component Value Date   GFRAA 85 12/25/2018    Medication Review Current Outpatient Medications on File Prior to Visit  Medication Sig Dispense Refill  . acetaminophen (TYLENOL) 500 MG tablet Take 500 mg by mouth every 6 (six) hours as needed for mild pain  or headache.     Marland Kitchen aspirin EC 81 MG tablet Take 81 mg by mouth daily.    Marland Kitchen atorvastatin (LIPITOR) 40 MG tablet TAKE 1 TABLET(40 MG) BY MOUTH DAILY 90 tablet 1  . Cholecalciferol (VITAMIN D PO) Take 5,000 Units by mouth daily.    Marland Kitchen losartan-hydrochlorothiazide (HYZAAR) 100-25 MG tablet Take 1/2 to 1 tablet daily for BP 90 tablet 1  . Magnesium 400 MG TABS Take 1 tablet by mouth daily.    . potassium chloride SA (K-DUR,KLOR-CON) 20 MEQ tablet TAKE 1 TABLET BY MOUTH DAILY 90 tablet 1   No current facility-administered medications on file prior to visit.     Current Problems (verified) Patient Active Problem List   Diagnosis Date Noted  . FHx: heart disease 08/28/2018  . Other abnormal glucose 10/25/2016  . Chronic kidney disease (CKD) stage 2 07/19/2016  . Encounter for Medicare annual wellness exam 10/06/2015  . Medication management 09/02/2014  . Hyperlipidemia   . Hypertension   . Vitamin D deficiency   . SYNDROME, NEPHROTIC W/MINIMAL CHANGE LESION 07/12/2007    Screening Tests Immunization History  Administered Date(s) Administered  . Influenza, High Dose Seasonal PF 08/28/2018  . Influenza,inj,quad, With Preservative 10/25/2016  . Influenza,trivalent, recombinat, inj, PF 09/07/2015  . Influenza-Unspecified 08/30/2015  . Pneumococcal Conjugate-13 01/30/2018  . Pneumococcal Polysaccharide-23 10/29/2015  . Td 02/19/2000    Preventative care: Last colonoscopy: 08/2015 Last mammogram: 08/2017 Last pap smear/pelvic exam:   declines DEXA: 05/2016  Prior vaccinations: TD or Tdap: Patient is due for tetanus  But just wants flu shot today  Influenza: 2018 at church Pneumococcal: 49 Prevnar13: Due Shingles/Zostavax: declines  Names of Other Physician/Practitioners you currently use: 1. [facility]- here for primary care 2. Dr. Talbert Forest, eye doctor, last visit 6-8 weeks ago for cataract surgery 3. [facility], last visit 2 years  ago Patient Care Team: Unk Pinto, MD as PCP - General (Internal Medicine) Vania Rea, MD as Consulting Physician (Obstetrics and Gynecology) Lafayette Dragon, MD (Inactive) as Consulting Physician (Gastroenterology)  Allergies Allergies  Allergen Reactions  . Ace Inhibitors   . Naproxen     REACTION: renal failure  . Norvasc [Amlodipine Besylate]     edema  . Nsaids     SURGICAL HISTORY She  has a past surgical history that includes Abdominal hysterectomy and Eye surgery (Left, 2017). FAMILY HISTORY Her family history includes Cancer in her sister; Heart attack in her brother; Hypertension in her sister; Stroke in her sister. SOCIAL HISTORY She  reports that she has never smoked. She has never used smokeless tobacco. She reports that she does not drink alcohol or use drugs.  MEDICARE WELLNESS OBJECTIVES: Physical activity:   Cardiac risk factors:   Depression/mood screen:   Depression screen [facility] 2/9 12/24/2018  Decreased Interest 0  Down, Depressed, Hopeless 0  PHQ - 2 Score 0  ADLs:  In your present state of health, do you have any difficulty performing the following activities: 12/24/2018 12/17/2018  Hearing? N N  Vision? N N  Difficulty concentrating or making decisions? N N  Walking or climbing stairs? N N  Dressing or bathing? N N  Doing errands, shopping? N N  Some recent data might be hidden     Cognitive Testing  Alert? Yes  Normal Appearance?Yes  Oriented to person? Yes  Place? Yes   Time? Yes  Recall of three objects?  Yes  Can perform simple calculations? Yes  Displays appropriate judgment?Yes  Can read the correct time from a watch face?Yes  EOL planning:     Review of Systems  Constitutional: Negative for chills, fever, malaise/fatigue and weight loss.  HENT: Negative for congestion, ear pain and sore throat.   Eyes: Negative.   Respiratory: Negative for cough, shortness of breath and wheezing.   Cardiovascular: Negative for chest pain,  palpitations and leg swelling.  Gastrointestinal: Negative for blood in stool, constipation, diarrhea, heartburn and melena.  Genitourinary: Negative for dysuria, frequency, hematuria and urgency.  Skin: Negative.   Neurological: Negative for dizziness, sensory change, loss of consciousness and headaches.  Psychiatric/Behavioral: Negative for depression. The patient is not nervous/anxious and does not have insomnia.     Objective:   There were no vitals filed for this visit. There is no height or weight on file to calculate BMI.  General appearance: alert, no distress, WD/WN,  female HEENT: normocephalic, sclerae anicteric, TMs pearly, nares patent, no discharge or erythema, pharynx normal Oral cavity: MMM, no lesions Neck: supple, no lymphadenopathy, no thyromegaly, no masses Heart: RRR, normal S1, S2, no murmurs Lungs: CTA bilaterally, no wheezes, rhonchi, or rales Abdomen: +bs, soft, non tender, non distended, no masses, no hepatomegaly, no splenomegaly Musculoskeletal: nontender, no swelling, no obvious deformity Extremities: no edema, no cyanosis, no clubbing Pulses: 2+ symmetric, upper and lower extremities, normal cap refill Neurological: alert, oriented x 3, CN2-12 intact, strength normal upper extremities and lower extremities, sensation normal throughout, DTRs 2+ throughout, no cerebellar signs, gait normal Psychiatric: normal affect, behavior normal, pleasant  Breast: defer Gyn: defer Rectal: defer   Medicare Attestation I have personally reviewed: The patient's medical and social history Their use of alcohol, tobacco or illicit drugs Their current medications and supplements The patient's functional ability including ADLs,fall risks, home safety risks, cognitive, and hearing and visual impairment Diet and physical activities Evidence for depression or mood disorders  The patient's weight, height, BMI, and visual acuity have been recorded in the chart.  I have made  referrals, counseling, and provided education to the patient based on review of the above and I have provided the patient with a written personalized care plan for preventive services.     Vicie Mutters, PA-C   04/05/2019

## 2019-04-09 ENCOUNTER — Ambulatory Visit (INDEPENDENT_AMBULATORY_CARE_PROVIDER_SITE_OTHER): Payer: PPO | Admitting: Physician Assistant

## 2019-04-09 ENCOUNTER — Ambulatory Visit: Payer: Self-pay | Admitting: Physician Assistant

## 2019-04-09 ENCOUNTER — Encounter: Payer: Self-pay | Admitting: Physician Assistant

## 2019-04-09 ENCOUNTER — Other Ambulatory Visit: Payer: Self-pay

## 2019-04-09 VITALS — BP 130/80 | HR 85 | Temp 97.5°F | Ht 63.0 in | Wt 193.6 lb

## 2019-04-09 DIAGNOSIS — E782 Mixed hyperlipidemia: Secondary | ICD-10-CM

## 2019-04-09 DIAGNOSIS — Z79899 Other long term (current) drug therapy: Secondary | ICD-10-CM

## 2019-04-09 DIAGNOSIS — Z8249 Family history of ischemic heart disease and other diseases of the circulatory system: Secondary | ICD-10-CM

## 2019-04-09 DIAGNOSIS — E559 Vitamin D deficiency, unspecified: Secondary | ICD-10-CM | POA: Diagnosis not present

## 2019-04-09 DIAGNOSIS — R7309 Other abnormal glucose: Secondary | ICD-10-CM

## 2019-04-09 DIAGNOSIS — N040 Nephrotic syndrome with minor glomerular abnormality: Secondary | ICD-10-CM

## 2019-04-09 DIAGNOSIS — R7303 Prediabetes: Secondary | ICD-10-CM | POA: Diagnosis not present

## 2019-04-09 DIAGNOSIS — I10 Essential (primary) hypertension: Secondary | ICD-10-CM | POA: Diagnosis not present

## 2019-04-09 DIAGNOSIS — N182 Chronic kidney disease, stage 2 (mild): Secondary | ICD-10-CM | POA: Diagnosis not present

## 2019-04-09 DIAGNOSIS — M25511 Pain in right shoulder: Secondary | ICD-10-CM | POA: Diagnosis not present

## 2019-04-09 DIAGNOSIS — R6889 Other general symptoms and signs: Secondary | ICD-10-CM | POA: Diagnosis not present

## 2019-04-09 DIAGNOSIS — Z0000 Encounter for general adult medical examination without abnormal findings: Secondary | ICD-10-CM

## 2019-04-09 DIAGNOSIS — Z0001 Encounter for general adult medical examination with abnormal findings: Secondary | ICD-10-CM

## 2019-04-09 MED ORDER — CYCLOBENZAPRINE HCL 10 MG PO TABS: 10.0000 mg | ORAL_TABLET | Freq: Three times a day (TID) | ORAL | 1 refills | Status: DC | PRN

## 2019-04-09 NOTE — Progress Notes (Signed)
WELCOME TO MEDICARE VISIT AND 3 MONTH FOLLOW UP  Assessment:    Encounter for Medicare annual wellness exam 1 year  Essential hypertension - continue medications, DASH diet, exercise and monitor at home. Call if greater than 130/80.  -     CBC with Differential/Platelet -     BASIC METABOLIC PANEL WITH GFR -     Hepatic function panel -     TSH  Mixed hyperlipidemia -continue medications, check lipids, decrease fatty foods, increase activity.  -     Lipid panel  Chronic kidney disease (CKD) stage 2 Increase fluids, avoid NSAIDS, monitor sugars, will monitor -     BASIC METABOLIC PANEL WITH GFR  SYNDROME, NEPHROTIC W/MINIMAL CHANGE LESION Will monitor -     BASIC METABOLIC PANEL WITH GFR  Medication management  Vitamin D deficiency Continue supplement  Other abnormal glucose -     Hemoglobin A1c  Acute pain of right shoulder -     cyclobenzaprine (FLEXERIL) 10 MG tablet; Take 1 tablet (10 mg total) by mouth 3 (three) times daily as needed for muscle spasms. -     Ambulatory referral to Orthopedic Surgery    Over 30 minutes of exam, counseling, chart review, and critical decision making was performed Future Appointments  Date Time Provider [place]  07/16/2019 11:00 AM Unk Pinto, MD [facility] None  04/15/2020  3:00 PM Vicie Mutters, PA-C [facility] None     Plan:   During the course of the visit the patient was educated and counseled about appropriate screening and preventive services including:    Pneumococcal vaccine   Influenza vaccine  Td vaccine  Prevnar 13  Screening electrocardiogram  Screening mammography  Bone densitometry screening  Colorectal cancer screening  Diabetes screening  Glaucoma screening  Nutrition counseling   Advanced directives: given info/requested copies   Subjective:   Lydia Edwards is a 68 y.o. female who presents for Medicare Annual Wellness Visit and 3 month follow up on hypertension,  prediabetes, hyperlipidemia, vitamin D def.   She is right handed, states she is having right shoulder pain x 1 month, she has to move cartons repeatedly all day long, occ above her head. Shoulder is worse after work, worse with motions above her head, occ pain down her arm into her triceps and in her hand. She has throbbing in her right hand, worse at night. States her grips is still good, no numbness tingling. She has not neck pain.   She works for [employer], normally [place]- Friday, SHE WILL RETIRE [place].  She states that they want her to start working every other weekend, however her husband has severe COPD and back pain. She has someone that can help him during the week but she needs to be with him on the weekends to take him to appointments, help him with medications, and be there in case of a flare.   BMI is Body mass index is 34.29 kg/m., she is working on diet and exercise. Wt Readings from Last 3 Encounters:  04/09/19 193 lb 9.6 oz (87.8 kg)  12/25/18 191 lb 6.4 oz (86.8 kg)  08/28/18 190 lb (86.2 kg)   Her blood pressure has been controlled at home, today their BP is BP: 130/80 She does workout.  She does a lot of walking.  She walks at least a mile daily. She denies chest pain, shortness of breath, dizziness.   She is on cholesterol medication and denies myalgias. Her cholesterol is at goal. The cholesterol last  visit was:   Lab Results  Component Value Date   CHOL 145 12/25/2018   HDL 46 (L) 12/25/2018   LDLCALC 81 12/25/2018   LDLDIRECT 160.1 07/18/2009   TRIG 92 12/25/2018   CHOLHDL 3.2 12/25/2018   She has been working on diet and exercise for prediabetes, SHE HAS BEEN DRINKING SWEET TEA, and denies foot ulcerations, hyperglycemia, hypoglycemia , increased appetite, nausea, paresthesia of the feet, polydipsia, polyuria, visual disturbances, vomiting and weight loss. Last A1C in the office was:  Lab Results  Component Value Date   HGBA1C 6.4 (H) 12/25/2018   Patient is on Vitamin  D supplement. Lab Results  Component Value Date   VD25OH 47 12/25/2018     She has a history of nephrotic syndrome in the past, saw Dr. Lorrene Reid for that, has been discharged and we are still monitoring kidney functions.  Lab Results  Component Value Date   GFRAA 85 12/25/2018    Medication Review Current Outpatient Medications on File Prior to Visit  Medication Sig Dispense Refill  . acetaminophen (TYLENOL) 500 MG tablet Take 500 mg by mouth every 6 (six) hours as needed for mild pain or headache.     Marland Kitchen aspirin EC 81 MG tablet Take 81 mg by mouth daily.    Marland Kitchen atorvastatin (LIPITOR) 40 MG tablet TAKE 1 TABLET(40 MG) BY MOUTH DAILY 90 tablet 1  . Cholecalciferol (VITAMIN D PO) Take 5,000 Units by mouth daily.    Marland Kitchen losartan-hydrochlorothiazide (HYZAAR) 100-25 MG tablet Take 1/2 to 1 tablet daily for BP 90 tablet 1  . Magnesium 400 MG TABS Take 1 tablet by mouth daily.    . potassium chloride SA (K-DUR,KLOR-CON) 20 MEQ tablet TAKE 1 TABLET BY MOUTH DAILY 90 tablet 1   No current facility-administered medications on file prior to visit.     Current Problems (verified) Patient Active Problem List   Diagnosis Date Noted  . FHx: heart disease 08/28/2018  . Other abnormal glucose 10/25/2016  . Chronic kidney disease (CKD) stage 2 07/19/2016  . Encounter for Medicare annual wellness exam 10/06/2015  . Medication management 09/02/2014  . Hyperlipidemia   . Hypertension   . Vitamin D deficiency   . SYNDROME, NEPHROTIC W/MINIMAL CHANGE LESION 07/12/2007    Screening Tests Immunization History  Administered Date(s) Administered  . Influenza, High Dose Seasonal PF 08/28/2018  . Influenza,inj,quad, With Preservative 10/25/2016  . Influenza,trivalent, recombinat, inj, PF 09/07/2015  . Influenza-Unspecified 08/30/2015  . Pneumococcal Conjugate-13 01/30/2018  . Pneumococcal Polysaccharide-23 10/29/2015  . Td 02/19/2000    Preventative care: Last colonoscopy: 08/2015 Last mammogram:  12/2018 Last pap smear/pelvic exam:   declines DEXA: 05/2016 normal -0.6  Prior vaccinations: TD or Tdap: Patient is due for tetanus will make a nurse visit  Influenza: 2019 at church Pneumococcal: 2016 Prevnar13: 2019 Shingles/Zostavax: declines  Names of Other Physician/Practitioners you currently use: 1. [facility]- here for primary care 2. Dr. Talbert Forest, eye doctor, 2019 [facility]. [facility], last visit 2018 Patient Care Team: Unk Pinto, MD as PCP - General (Internal Medicine) Vania Rea, MD as Consulting Physician (Obstetrics and Gynecology) Lafayette Dragon, MD (Inactive) as Consulting Physician (Gastroenterology)  Allergies Allergies  Allergen Reactions  . Ace Inhibitors   . Naproxen     REACTION: renal failure  . Norvasc [Amlodipine Besylate]     edema  . Nsaids     SURGICAL HISTORY She  has a past surgical history that includes Abdominal hysterectomy and Eye  surgery (Left, 2017). FAMILY HISTORY Her family history includes Cancer in her sister; Heart attack in her brother; Hypertension in her sister; Stroke in her sister. SOCIAL HISTORY She  reports that she has never smoked. She has never used smokeless tobacco. She reports that she does not drink alcohol or use drugs.  MEDICARE WELLNESS OBJECTIVES: Physical activity: Current Exercise Habits: Home exercise routine, Type of exercise: walking, Time (Minutes): 15, Frequency (Times/Week): 7, Weekly Exercise (Minutes/Week): 105, Intensity: Mild Cardiac risk factors: Cardiac Risk Factors include: advanced age (>20men, >83 women);dyslipidemia;hypertension;sedentary lifestyle;obesity (BMI >30kg/m2) Depression/mood screen:   Depression screen [facility] 2/9 04/09/2019  Decreased Interest 0  Down, Depressed, Hopeless 0  PHQ - 2 Score 0    ADLs:  In your present state of health, do you have any difficulty performing the following activities: 04/09/2019 12/24/2018  Hearing? N N   Vision? N N  Difficulty concentrating or making decisions? N N  Walking or climbing stairs? N N  Dressing or bathing? N N  Doing errands, shopping? N N  Some recent data might be hidden     Cognitive Testing  Alert? Yes  Normal Appearance?Yes  Oriented to person? Yes  Place? Yes   Time? Yes  Recall of three objects?  Yes  Can perform simple calculations? Yes  Displays appropriate judgment?Yes  Can read the correct time from a watch face?Yes  EOL planning: Does Patient Have a Medical Advance Directive?: No Would patient like information on creating a medical advance directive?: Yes (MAU/Ambulatory/Procedural Areas - Information given)   Review of Systems  Constitutional: Negative for chills, fever, malaise/fatigue and weight loss.  HENT: Negative for congestion, ear pain and sore throat.   Eyes: Negative.   Respiratory: Negative for cough, shortness of breath and wheezing.   Cardiovascular: Negative for chest pain, palpitations and leg swelling.  Gastrointestinal: Negative for blood in stool, constipation, diarrhea, heartburn and melena.  Genitourinary: Negative for dysuria, frequency, hematuria and urgency.  Musculoskeletal: Positive for joint pain.  Skin: Negative.   Neurological: Negative for dizziness, sensory change, loss of consciousness and headaches.  Psychiatric/Behavioral: Negative for depression. The patient is not nervous/anxious and does not have insomnia.     Objective:   Today's Vitals   04/09/19 1509  BP: 130/80  Pulse: 85  Temp: (!) 97.5 F (36.4 C)  SpO2: 97%  Weight: 193 lb 9.6 oz (87.8 kg)  Height: 5\' 3"  (1.6 m)  PainSc: 7    Body mass index is 34.29 kg/m.  General appearance: alert, no distress, WD/WN,  female HEENT: normocephalic, sclerae anicteric, TMs pearly, nares patent, no discharge or erythema, pharynx normal Oral cavity: MMM, no lesions Neck: supple, no lymphadenopathy, no thyromegaly, no masses Heart: RRR, normal S1, S2, no  murmurs Lungs: CTA bilaterally, no wheezes, rhonchi, or rales Abdomen: +bs, soft, non tender, non distended, no masses, no hepatomegaly, no splenomegaly Musculoskeletal: nontender, no swelling, no obvious deformity Extremities: no edema, no cyanosis, no clubbing Pulses: 2+ symmetric, upper and lower extremities, normal cap refill Neurological: alert, oriented x 3, CN2-12 intact, strength normal upper extremities and lower extremities, sensation normal throughout, DTRs 2+ throughout, no cerebellar signs, gait normal Psychiatric: normal affect, behavior normal, pleasant  Breast: defer Gyn: defer Rectal: defer   Medicare Attestation I have personally reviewed: The patient's medical and social history Their use of alcohol, tobacco or illicit drugs Their current medications and supplements The patient's functional ability including ADLs,fall risks, home safety risks, cognitive, and hearing and visual impairment Diet and physical  activities Evidence for depression or mood disorders  The patient's weight, height, BMI, and visual acuity have been recorded in the chart.  I have made referrals, counseling, and provided education to the patient based on review of the above and I have provided the patient with a written personalized care plan for preventive services.     Vicie Mutters, PA-C   04/09/2019

## 2019-04-09 NOTE — Patient Instructions (Addendum)
SHOULDER PAIN  Try the exercises and other information.   You can take flexeril if needed at bedtime for muscle spasm. This can be taken up to every 8 hours, but causes sedation, so should not drive or operate heavy machinery while taking this medicine.   Go to the ER if you have any new weakness in your ARM OR HANDS, or have worsening pain.    WILL REFER TO ORTHO   Shoulder Impingement Syndrome Rehab Ask your health care provider which exercises are safe for you. Do exercises exactly as told by your health care provider and adjust them as directed. It is normal to feel mild stretching, pulling, tightness, or discomfort as you do these exercises, but you should stop right away if you feel sudden pain or your pain gets worse.Do not begin these exercises until told by your health care provider. Stretching and range of motion exercise This exercise warms up your muscles and joints and improves the movement and flexibility of your shoulder. This exercise also helps to relieve pain and stiffness. Exercise A: Passive horizontal adduction  1. Sit or stand and pull your left / right elbow across your chest, toward your other shoulder. Stop when you feel a gentle stretch in the back of your shoulder and upper arm. ? Keep your arm at shoulder height. ? Keep your arm as close to your body as you comfortably can. 2. Hold for __________ seconds. 3. Slowly return to the starting position. Repeat __________ times. Complete this exercise __________ times a day. Strengthening exercises These exercises build strength and endurance in your shoulder. Endurance is the ability to use your muscles for a long time, even after they get tired. Exercise B: External rotation, isometric 1. Stand or sit in a doorway, facing the door frame. 2. Bend your left / right elbow and place the back of your wrist against the door frame. Only your wrist should be touching the frame. Keep your upper arm at your side. 3. Gently  press your wrist against the door frame, as if you are trying to push your arm away from your abdomen. ? Avoid shrugging your shoulder while you press your hand against the door frame. Keep your shoulder blade tucked down toward the middle of your back. 4. Hold for __________ seconds. 5. Slowly release the tension, and relax your muscles completely before you do the exercise again. Repeat __________ times. Complete this exercise __________ times a day. Exercise C: Internal rotation, isometric  1. Stand or sit in a doorway, facing the door frame. 2. Bend your left / right elbow and place the inside of your wrist against the door frame. Only your wrist should be touching the frame. Keep your upper arm at your side. 3. Gently press your wrist against the door frame, as if you are trying to push your arm toward your abdomen. ? Avoid shrugging your shoulder while you press your hand against the door frame. Keep your shoulder blade tucked down toward the middle of your back. 4. Hold for __________ seconds. 5. Slowly release the tension, and relax your muscles completely before you do the exercise again. Repeat __________ times. Complete this exercise __________ times a day. Exercise D: Scapular protraction, supine  1. Lie on your back on a firm surface. Hold a __________ weight in your left / right hand. 2. Raise your left / right arm straight into the air so your hand is directly above your shoulder joint. 3. Push the weight into the air  so your shoulder lifts off of the surface that you are lying on. Do not move your head, neck, or back. 4. Hold for __________ seconds. 5. Slowly return to the starting position. Let your muscles relax completely before you repeat this exercise. Repeat __________ times. Complete this exercise __________ times a day. Exercise E: Scapular retraction  1. Sit in a stable chair without armrests, or stand. 2. Secure an exercise band to a stable object in front of you so  the band is at shoulder height. 3. Hold one end of the exercise band in each hand. Your palms should face down. 4. Squeeze your shoulder blades together and move your elbows slightly behind you. Do not shrug your shoulders while you do this. 5. Hold for __________ seconds. 6. Slowly return to the starting position. Repeat __________ times. Complete this exercise __________ times a day. Exercise F: Shoulder extension  1. Sit in a stable chair without armrests, or stand. 2. Secure an exercise band to a stable object in front of you where the band is above shoulder height. 3. Hold one end of the exercise band in each hand. 4. Straighten your elbows and lift your hands up to shoulder height. 5. Squeeze your shoulder blades together and pull your hands down to the sides of your thighs. Stop when your hands are straight down by your sides. Do not let your hands go behind your body. 6. Hold for __________ seconds. 7. Slowly return to the starting position. Repeat __________ times. Complete this exercise __________ times a day. This information is not intended to replace advice given to you by your health care provider. Make sure you discuss any questions you have with your health care provider. Document Released: 11/15/2005 Document Revised: 07/22/2016 Document Reviewed: 10/18/2015 Elsevier Interactive Patient Education  2019 [employer].

## 2019-04-10 LAB — COMPLETE METABOLIC PANEL WITH GFR
AG Ratio: 1.8 (calc) (ref 1.0–2.5)
ALT: 11 U/L (ref 6–29)
AST: 15 U/L (ref 10–35)
Albumin: 4.4 g/dL (ref 3.6–5.1)
Alkaline phosphatase (APISO): 66 U/L (ref 37–153)
BUN: 11 mg/dL (ref 7–25)
CO2: 31 mmol/L (ref 20–32)
Calcium: 9.3 mg/dL (ref 8.6–10.4)
Chloride: 107 mmol/L (ref 98–110)
Creat: 0.68 mg/dL (ref 0.50–0.99)
GFR, Est African American: 104 mL/min/{1.73_m2} (ref >=60)
GFR, Est Non African American: 90 mL/min/{1.73_m2} (ref >=60)
Globulin: 2.5 g/dL (calc) (ref 1.9–3.7)
Glucose, Bld: 96 mg/dL (ref 65–99)
Potassium: 4.4 mmol/L (ref 3.5–5.3)
Sodium: 144 mmol/L (ref 135–146)
Total Bilirubin: 0.4 mg/dL (ref 0.2–1.2)
Total Protein: 6.9 g/dL (ref 6.1–8.1)

## 2019-04-10 LAB — CBC WITH DIFFERENTIAL/PLATELET
Absolute Monocytes: 836 cells/uL (ref 200–950)
Basophils Absolute: 53 cells/uL (ref 0–200)
Basophils Relative: 0.6 %
Eosinophils Absolute: 238 cells/uL (ref 15–500)
Eosinophils Relative: 2.7 %
HCT: 39.5 % (ref 35.0–45.0)
Hemoglobin: 12.5 g/dL (ref 11.7–15.5)
Lymphs Abs: 3018 cells/uL (ref 850–3900)
MCH: 26.4 pg — ABNORMAL LOW (ref 27.0–33.0)
MCHC: 31.6 g/dL — ABNORMAL LOW (ref 32.0–36.0)
MCV: 83.3 fL (ref 80.0–100.0)
MPV: 10.2 fL (ref 7.5–12.5)
Monocytes Relative: 9.5 %
Neutro Abs: 4655 cells/uL (ref 1500–7800)
Neutrophils Relative %: 52.9 %
Platelets: 254 10*3/uL (ref 140–400)
RBC: 4.74 10*6/uL (ref 3.80–5.10)
RDW: 12.8 % (ref 11.0–15.0)
Total Lymphocyte: 34.3 %
WBC: 8.8 10*3/uL (ref 3.8–10.8)

## 2019-04-10 LAB — MAGNESIUM: Magnesium: 1.9 mg/dL (ref 1.5–2.5)

## 2019-04-10 LAB — TSH: TSH: 1.1 mIU/L (ref 0.40–4.50)

## 2019-04-10 LAB — LIPID PANEL
Cholesterol: 128 mg/dL (ref <200)
HDL: 41 mg/dL — ABNORMAL LOW (ref >=50)
LDL Cholesterol (Calc): 67 mg/dL (calc)
Non-HDL Cholesterol (Calc): 87 mg/dL (calc) (ref <130)
Total CHOL/HDL Ratio: 3.1 (calc) (ref <5.0)
Triglycerides: 110 mg/dL (ref <150)

## 2019-04-10 LAB — HEMOGLOBIN A1C
Hgb A1c MFr Bld: 6.2 % of total Hgb — ABNORMAL HIGH (ref <5.7)
Mean Plasma Glucose: 131 (calc)
eAG (mmol/L): 7.3 (calc)

## 2019-04-18 DIAGNOSIS — M25511 Pain in right shoulder: Secondary | ICD-10-CM | POA: Diagnosis not present

## 2019-05-28 DIAGNOSIS — H0102B Squamous blepharitis left eye, upper and lower eyelids: Secondary | ICD-10-CM | POA: Diagnosis not present

## 2019-05-28 DIAGNOSIS — H11153 Pinguecula, bilateral: Secondary | ICD-10-CM | POA: Diagnosis not present

## 2019-05-28 DIAGNOSIS — H11823 Conjunctivochalasis, bilateral: Secondary | ICD-10-CM | POA: Diagnosis not present

## 2019-05-28 DIAGNOSIS — H40023 Open angle with borderline findings, high risk, bilateral: Secondary | ICD-10-CM | POA: Diagnosis not present

## 2019-05-28 DIAGNOSIS — H18413 Arcus senilis, bilateral: Secondary | ICD-10-CM | POA: Diagnosis not present

## 2019-05-28 DIAGNOSIS — H0102A Squamous blepharitis right eye, upper and lower eyelids: Secondary | ICD-10-CM | POA: Diagnosis not present

## 2019-05-28 DIAGNOSIS — H16223 Keratoconjunctivitis sicca, not specified as Sjogren's, bilateral: Secondary | ICD-10-CM | POA: Diagnosis not present

## 2019-05-28 DIAGNOSIS — G510 Bell's palsy: Secondary | ICD-10-CM | POA: Diagnosis not present

## 2019-05-28 DIAGNOSIS — H5211 Myopia, right eye: Secondary | ICD-10-CM | POA: Diagnosis not present

## 2019-06-19 ENCOUNTER — Encounter: Payer: Self-pay | Admitting: Internal Medicine

## 2019-07-05 ENCOUNTER — Other Ambulatory Visit: Payer: Self-pay

## 2019-07-05 MED ORDER — ATORVASTATIN CALCIUM 40 MG PO TABS: ORAL_TABLET | ORAL | 0 refills | Status: DC

## 2019-07-09 DIAGNOSIS — H18413 Arcus senilis, bilateral: Secondary | ICD-10-CM | POA: Diagnosis not present

## 2019-07-09 DIAGNOSIS — H40023 Open angle with borderline findings, high risk, bilateral: Secondary | ICD-10-CM | POA: Diagnosis not present

## 2019-07-09 DIAGNOSIS — H16223 Keratoconjunctivitis sicca, not specified as Sjogren's, bilateral: Secondary | ICD-10-CM | POA: Diagnosis not present

## 2019-07-09 DIAGNOSIS — H11153 Pinguecula, bilateral: Secondary | ICD-10-CM | POA: Diagnosis not present

## 2019-07-09 DIAGNOSIS — G510 Bell's palsy: Secondary | ICD-10-CM | POA: Diagnosis not present

## 2019-07-09 DIAGNOSIS — H401121 Primary open-angle glaucoma, left eye, mild stage: Secondary | ICD-10-CM | POA: Diagnosis not present

## 2019-07-09 DIAGNOSIS — H0102A Squamous blepharitis right eye, upper and lower eyelids: Secondary | ICD-10-CM | POA: Diagnosis not present

## 2019-07-09 DIAGNOSIS — H0102B Squamous blepharitis left eye, upper and lower eyelids: Secondary | ICD-10-CM | POA: Diagnosis not present

## 2019-07-09 DIAGNOSIS — H11823 Conjunctivochalasis, bilateral: Secondary | ICD-10-CM | POA: Diagnosis not present

## 2019-07-09 DIAGNOSIS — Z961 Presence of intraocular lens: Secondary | ICD-10-CM | POA: Diagnosis not present

## 2019-07-15 ENCOUNTER — Encounter: Payer: Self-pay | Admitting: Internal Medicine

## 2019-07-15 NOTE — Patient Instructions (Signed)

## 2019-07-15 NOTE — Progress Notes (Signed)
Annual Screening/Preventative Visit & Comprehensive Evaluation &  Examination     This very nice 68 y.o. [name]  presents for a Screening /Preventative Visit & comprehensive evaluation and management of multiple medical co-morbidities.  Patient has been followed for HTN, HLD, Prediabetes  and Vitamin D Deficiency.      HTN predates since     . Patient's BP has been controlled at home and patient denies any cardiac symptoms as chest pain, palpitations, shortness of breath, dizziness or ankle swelling. Patient was hospitalized in 2011 with ARF due to NSAIDs & again in 2016 with CAP & Dehydration requiring dialysis for recovery to her baseline CKD2. Today's BP is at goal - 140/86.      Patient's hyperlipidemia is controlled with diet and medications. Patient denies myalgias or other medication SE's. Last lipids were at goal: Lab Results  Component Value Date   CHOL 128 04/09/2019   HDL 41 (L) 04/09/2019   LDLCALC 67 04/09/2019   TRIG 110 04/09/2019   CHOLHDL 3.1 04/09/2019      Patient has hx/o prediabetes (A1c 6.2% / 2012) and patient denies reactive hypoglycemic symptoms, visual blurring, diabetic polys or paresthesias. Last A1c was not at goal: Lab Results  Component Value Date   HGBA1C 6.2 (H) 04/09/2019      Finally, patient has history of Vitamin D Deficiency ("12" / 2012) and last Vitamin D was still low: Lab Results  Component Value Date   VD25OH 47 12/25/2018   Current Outpatient Medications on File Prior to Visit  Medication Sig  . acetaminophen (TYLENOL) 500 MG tablet Take 500 mg by mouth every 6 (six) hours as needed for mild pain or headache.   Marland Kitchen aspirin EC 81 MG tablet Take 81 mg by mouth daily.  Marland Kitchen atorvastatin (LIPITOR) 40 MG tablet TAKE 1 TABLET(40 MG) BY MOUTH DAILY  . Cholecalciferol (VITAMIN D PO) Take 5,000 Units by mouth daily.  . cyclobenzaprine (FLEXERIL) 10 MG tablet Take 1 tablet (10 mg total) by mouth 3 (three) times daily as needed for muscle spasms.  Marland Kitchen  losartan-hydrochlorothiazide (HYZAAR) 100-25 MG tablet Take 1/2 to 1 tablet daily for BP  . Magnesium 400 MG TABS Take 1 tablet by mouth daily.  . potassium chloride SA (K-DUR,KLOR-CON) 20 MEQ tablet TAKE 1 TABLET BY MOUTH DAILY   No current facility-administered medications on file prior to visit.    Allergies  Allergen Reactions  . Ace Inhibitors   . Naproxen     REACTION: renal failure  . Norvasc [Amlodipine Besylate]     edema  . Nsaids    Past Medical History:  Diagnosis Date  . Hyperlipidemia   . Hypertension   . Prediabetes   . Vitamin D deficiency    Health Maintenance  Topic Date Due  . INFLUENZA VACCINE  06/30/2019  . TETANUS/TDAP  09/30/2019 (Originally 02/18/2010)  . MAMMOGRAM  01/08/2021  . COLONOSCOPY  09/09/2025  . DEXA SCAN  Completed  . Hepatitis C Screening  Completed  . PNA vac Low Risk Adult  Completed   Immunization History  Administered Date(s) Administered  . Influenza, High Dose Seasonal PF 08/28/2018  . Influenza,inj,quad, With Preservative 10/25/2016  . Influenza,trivalent, recombinat, inj, PF 09/07/2015  . Influenza-Unspecified 08/30/2015  . Pneumococcal Conjugate-13 01/30/2018  . Pneumococcal Polysaccharide-23 10/29/2015  . Td 02/19/2000   Last Colon - 09/10/2015 - Dr Havery Moros - hyperplastic polyp - 10 yr f/u  Last MGM - 01/22/2019 Last dexaBMD -06/14/2016 - Normal - No Osteoporosis  Past  Surgical History:  Procedure Laterality Date  . ABDOMINAL HYSTERECTOMY    . EYE SURGERY Left 2017   Cataract sugery left eye   Family History  Problem Relation Age of Onset  . Stroke Sister   . Hypertension Sister   . Cancer Sister   . Heart attack Brother   . Colon cancer Neg Hx   . Breast cancer Neg Hx    Social History   Tobacco Use  . Smoking status: Never Smoker  . Smokeless tobacco: Never Used  Substance Use Topics  . Alcohol use: No    Alcohol/week: 0.0 standard drinks  . Drug use: No    ROS Constitutional: Denies fever,  chills, weight loss/gain, headaches, insomnia,  night sweats, and change in appetite. Does c/o fatigue. Eyes: Denies redness, blurred vision, diplopia, discharge, itchy, watery eyes.  ENT: Denies discharge, congestion, post nasal drip, epistaxis, sore throat, earache, hearing loss, dental pain, Tinnitus, Vertigo, Sinus pain, snoring.  Cardio: Denies chest pain, palpitations, irregular heartbeat, syncope, dyspnea, diaphoresis, orthopnea, PND, claudication, edema Respiratory: denies cough, dyspnea, DOE, pleurisy, hoarseness, laryngitis, wheezing.  Gastrointestinal: Denies dysphagia, heartburn, reflux, water brash, pain, cramps, nausea, vomiting, bloating, diarrhea, constipation, hematemesis, melena, hematochezia, jaundice, hemorrhoids Genitourinary: Denies dysuria, frequency, urgency, nocturia, hesitancy, discharge, hematuria, flank pain Breast: Breast lumps, nipple discharge, bleeding.  Musculoskeletal: Denies arthralgia, myalgia, stiffness, Jt. Swelling, pain, limp, and strain/sprain. Denies falls. Skin: Denies puritis, rash, hives, warts, acne, eczema, changing in skin lesion Neuro: No weakness, tremor, incoordination, spasms, paresthesia, pain Psychiatric: Denies confusion, memory loss, sensory loss. Denies Depression. Endocrine: Denies change in weight, skin, hair change, nocturia, and paresthesia, diabetic polys, visual blurring, hyper / hypo glycemic episodes.  Heme/Lymph: No excessive bleeding, bruising, enlarged lymph nodes.  Physical Exam  BP 140/86   Pulse 68   Temp (!) 97.3 F (36.3 C)   Resp 16   Ht 5\' 3"  (1.6 m)   Wt 189 lb 12.8 oz (86.1 kg)   BMI 33.62 kg/m   General Appearance: Well nourished, well groomed and in no apparent distress.  Eyes: PERRLA, EOMs, conjunctiva no swelling or erythema, normal fundi and vessels. Sinuses: No frontal/maxillary tenderness ENT/Mouth: EACs patent / TMs  nl. Nares clear without erythema, swelling, mucoid exudates. Oral hygiene is good. No  erythema, swelling, or exudate. Tongue normal, non-obstructing. Tonsils not swollen or erythematous. Hearing normal.  Neck: Supple, thyroid not palpable. No bruits, nodes or JVD. Respiratory: Respiratory effort normal.  BS equal and clear bilateral without rales, rhonci, wheezing or stridor. Cardio: Heart sounds are normal with regular rate and rhythm and no murmurs, rubs or gallops. Peripheral pulses are normal and equal bilaterally without edema. No aortic or femoral bruits. Chest: symmetric with normal excursions and percussion. Breasts: Symmetric, without lumps, nipple discharge, retractions, or fibrocystic changes.  Abdomen: Flat, soft with bowel sounds active. Nontender, no guarding, rebound, hernias, masses, or organomegaly.  Lymphatics: Non tender without lymphadenopathy.  Musculoskeletal: Full ROM all peripheral extremities, joint stability, 5/5 strength, and normal gait. Skin: Warm and dry without rashes, lesions, cyanosis, clubbing or  ecchymosis.  Neuro: Cranial nerves intact, reflexes equal bilaterally. Normal muscle tone, no cerebellar symptoms. Sensation intact.  Pysch: Alert and oriented X 3, normal affect, Insight and Judgment appropriate.   Assessment and Plan  1. Annual Preventative Screening Examination  2. Essential hypertension  - EKG 12-Lead - Urinalysis, Routine w reflex microscopic - Microalbumin / creatinine urine ratio - CBC with Differential/Platelet - COMPLETE METABOLIC PANEL WITH GFR - TSH  3.  Hyperlipidemia, mixed  - EKG 12-Lead - Lipid panel - TSH  4. Abnormal glucose  - EKG 12-Lead - Hemoglobin A1c - Insulin, random  5. Vitamin D deficiency  - VITAMIN D 25 Hydroxyl  6. Prediabetes  - EKG 12-Lead - Hemoglobin A1c - Insulin, random  7. Chronic kidney disease (CKD) stage 2   8. Screening for colorectal cancer  - POC Hemoccult Bld/Stl  9. Screening for ischemic heart disease  - EKG 12-Lead  10. FHx: heart disease  - EKG 12-Lead   11. Medication management  - Urinalysis, Routine w reflex microscopic - Microalbumin / creatinine urine ratio - CBC with Differential/Platelet - COMPLETE METABOLIC PANEL WITH GFR - Magnesium - Lipid panel - TSH - Hemoglobin A1c - Insulin, random - VITAMIN D 25 Hydroxyl        Patient was counseled in prudent diet to achieve/maintain BMI less than 25 for weight control, BP monitoring, regular exercise and medications. Discussed med's effects and SE's. Screening labs and tests as requested with regular follow-up as recommended. Over 40 minutes of exam, counseling, chart review and high complex critical decision making was performed.   Kirtland Bouchard, MD

## 2019-07-16 ENCOUNTER — Other Ambulatory Visit: Payer: Self-pay

## 2019-07-16 ENCOUNTER — Ambulatory Visit (INDEPENDENT_AMBULATORY_CARE_PROVIDER_SITE_OTHER): Payer: PPO | Admitting: Internal Medicine

## 2019-07-16 VITALS — BP 140/86 | HR 68 | Temp 97.3°F | Resp 16 | Ht 63.0 in | Wt 189.8 lb

## 2019-07-16 DIAGNOSIS — Z136 Encounter for screening for cardiovascular disorders: Secondary | ICD-10-CM

## 2019-07-16 DIAGNOSIS — Z1211 Encounter for screening for malignant neoplasm of colon: Secondary | ICD-10-CM

## 2019-07-16 DIAGNOSIS — Z0000 Encounter for general adult medical examination without abnormal findings: Secondary | ICD-10-CM

## 2019-07-16 DIAGNOSIS — E559 Vitamin D deficiency, unspecified: Secondary | ICD-10-CM

## 2019-07-16 DIAGNOSIS — N3000 Acute cystitis without hematuria: Secondary | ICD-10-CM

## 2019-07-16 DIAGNOSIS — Z79899 Other long term (current) drug therapy: Secondary | ICD-10-CM

## 2019-07-16 DIAGNOSIS — R7303 Prediabetes: Secondary | ICD-10-CM

## 2019-07-16 DIAGNOSIS — E782 Mixed hyperlipidemia: Secondary | ICD-10-CM | POA: Diagnosis not present

## 2019-07-16 DIAGNOSIS — I10 Essential (primary) hypertension: Secondary | ICD-10-CM | POA: Diagnosis not present

## 2019-07-16 DIAGNOSIS — Z0001 Encounter for general adult medical examination with abnormal findings: Secondary | ICD-10-CM

## 2019-07-16 DIAGNOSIS — R7309 Other abnormal glucose: Secondary | ICD-10-CM

## 2019-07-16 DIAGNOSIS — Z8249 Family history of ischemic heart disease and other diseases of the circulatory system: Secondary | ICD-10-CM

## 2019-07-16 DIAGNOSIS — B372 Candidiasis of skin and nail: Secondary | ICD-10-CM

## 2019-07-16 DIAGNOSIS — N182 Chronic kidney disease, stage 2 (mild): Secondary | ICD-10-CM

## 2019-07-16 MED ORDER — KETOCONAZOLE 2 % EX CREA: 1.0000 "application " | TOPICAL_CREAM | Freq: Every day | CUTANEOUS | 2 refills | Status: DC

## 2019-07-16 MED ORDER — FLUCONAZOLE 150 MG PO TABS: ORAL_TABLET | ORAL | 3 refills | Status: DC

## 2019-07-17 ENCOUNTER — Other Ambulatory Visit: Payer: Self-pay | Admitting: Internal Medicine

## 2019-07-17 DIAGNOSIS — N3000 Acute cystitis without hematuria: Secondary | ICD-10-CM | POA: Diagnosis not present

## 2019-07-17 DIAGNOSIS — Z79899 Other long term (current) drug therapy: Secondary | ICD-10-CM

## 2019-07-17 DIAGNOSIS — K219 Gastro-esophageal reflux disease without esophagitis: Secondary | ICD-10-CM

## 2019-07-17 DIAGNOSIS — I10 Essential (primary) hypertension: Secondary | ICD-10-CM

## 2019-07-17 LAB — LIPID PANEL
Cholesterol: 138 mg/dL (ref <200)
HDL: 45 mg/dL — ABNORMAL LOW (ref >=50)
LDL Cholesterol (Calc): 76 mg/dL (calc)
Non-HDL Cholesterol (Calc): 93 mg/dL (calc) (ref <130)
Total CHOL/HDL Ratio: 3.1 (calc) (ref <5.0)
Triglycerides: 83 mg/dL (ref <150)

## 2019-07-17 LAB — URINALYSIS, ROUTINE W REFLEX MICROSCOPIC
Bilirubin Urine: NEGATIVE
Glucose, UA: NEGATIVE
Hgb urine dipstick: NEGATIVE
Hyaline Cast: NONE SEEN /LPF
Ketones, ur: NEGATIVE
Nitrite: POSITIVE — AB
Protein, ur: NEGATIVE
Specific Gravity, Urine: 1.01 (ref 1.001–1.03)
pH: 7.5 (ref 5.0–8.0)

## 2019-07-17 LAB — COMPLETE METABOLIC PANEL WITH GFR
AG Ratio: 1.4 (calc) (ref 1.0–2.5)
ALT: 11 U/L (ref 6–29)
AST: 15 U/L (ref 10–35)
Albumin: 4.2 g/dL (ref 3.6–5.1)
Alkaline phosphatase (APISO): 67 U/L (ref 37–153)
BUN: 12 mg/dL (ref 7–25)
CO2: 29 mmol/L (ref 20–32)
Calcium: 9.5 mg/dL (ref 8.6–10.4)
Chloride: 104 mmol/L (ref 98–110)
Creat: 0.75 mg/dL (ref 0.50–0.99)
GFR, Est African American: 94 mL/min/{1.73_m2} (ref >=60)
GFR, Est Non African American: 81 mL/min/{1.73_m2} (ref >=60)
Globulin: 2.9 g/dL (calc) (ref 1.9–3.7)
Glucose, Bld: 101 mg/dL — ABNORMAL HIGH (ref 65–99)
Potassium: 4.1 mmol/L (ref 3.5–5.3)
Sodium: 144 mmol/L (ref 135–146)
Total Bilirubin: 0.6 mg/dL (ref 0.2–1.2)
Total Protein: 7.1 g/dL (ref 6.1–8.1)

## 2019-07-17 LAB — MICROALBUMIN / CREATININE URINE RATIO
Creatinine, Urine: 62 mg/dL (ref 20–275)
Microalb, Ur: <0.2 mg/dL

## 2019-07-17 LAB — TEST AUTHORIZATION

## 2019-07-17 LAB — MAGNESIUM: Magnesium: 1.8 mg/dL (ref 1.5–2.5)

## 2019-07-17 LAB — INSULIN, RANDOM: Insulin: 12.3 u[IU]/mL

## 2019-07-17 LAB — TSH: TSH: 1.76 mIU/L (ref 0.40–4.50)

## 2019-07-17 LAB — CBC WITH DIFFERENTIAL/PLATELET

## 2019-07-17 LAB — VITAMIN D 25 HYDROXY (VIT D DEFICIENCY, FRACTURES): Vit D, 25-Hydroxy: 71 ng/mL (ref 30–100)

## 2019-07-17 LAB — HEMOGLOBIN A1C

## 2019-07-17 NOTE — Addendum Note (Signed)
Addended by: Eulis Canner on: 07/17/2019 02:00 PM   Modules accepted: Orders

## 2019-07-18 ENCOUNTER — Other Ambulatory Visit: Payer: PPO

## 2019-07-18 ENCOUNTER — Other Ambulatory Visit: Payer: Self-pay

## 2019-07-18 DIAGNOSIS — I10 Essential (primary) hypertension: Secondary | ICD-10-CM

## 2019-07-18 DIAGNOSIS — Z79899 Other long term (current) drug therapy: Secondary | ICD-10-CM | POA: Diagnosis not present

## 2019-07-18 DIAGNOSIS — R7309 Other abnormal glucose: Secondary | ICD-10-CM | POA: Diagnosis not present

## 2019-07-18 DIAGNOSIS — K219 Gastro-esophageal reflux disease without esophagitis: Secondary | ICD-10-CM

## 2019-07-18 DIAGNOSIS — R7303 Prediabetes: Secondary | ICD-10-CM | POA: Diagnosis not present

## 2019-07-18 LAB — CBC WITH DIFFERENTIAL/PLATELET
Absolute Monocytes: 846 cells/uL (ref 200–950)
Basophils Absolute: 80 cells/uL (ref 0–200)
Basophils Relative: 0.9 %
Eosinophils Absolute: 258 cells/uL (ref 15–500)
Eosinophils Relative: 2.9 %
HCT: 37.4 % (ref 35.0–45.0)
Hemoglobin: 11.7 g/dL (ref 11.7–15.5)
Lymphs Abs: 3649 cells/uL (ref 850–3900)
MCH: 25.9 pg — ABNORMAL LOW (ref 27.0–33.0)
MCHC: 31.3 g/dL — ABNORMAL LOW (ref 32.0–36.0)
MCV: 82.7 fL (ref 80.0–100.0)
MPV: 9.5 fL (ref 7.5–12.5)
Monocytes Relative: 9.5 %
Neutro Abs: 4067 cells/uL (ref 1500–7800)
Neutrophils Relative %: 45.7 %
Platelets: 297 10*3/uL (ref 140–400)
RBC: 4.52 10*6/uL (ref 3.80–5.10)
RDW: 12.9 % (ref 11.0–15.0)
Total Lymphocyte: 41 %
WBC: 8.9 10*3/uL (ref 3.8–10.8)

## 2019-07-19 ENCOUNTER — Other Ambulatory Visit: Payer: Self-pay | Admitting: Internal Medicine

## 2019-07-19 DIAGNOSIS — N3000 Acute cystitis without hematuria: Secondary | ICD-10-CM

## 2019-07-19 LAB — URINE CULTURE
MICRO NUMBER:: 784376
SPECIMEN QUALITY:: ADEQUATE

## 2019-07-19 LAB — HEMOGLOBIN A1C
Hgb A1c MFr Bld: 6.3 % of total Hgb — ABNORMAL HIGH (ref <5.7)
Mean Plasma Glucose: 134 (calc)
eAG (mmol/L): 7.4 (calc)

## 2019-07-19 MED ORDER — CIPROFLOXACIN HCL 250 MG PO TABS: ORAL_TABLET | ORAL | 0 refills | Status: DC

## 2019-07-24 ENCOUNTER — Ambulatory Visit: Payer: PPO

## 2019-07-30 ENCOUNTER — Other Ambulatory Visit: Payer: Self-pay

## 2019-07-30 ENCOUNTER — Ambulatory Visit (INDEPENDENT_AMBULATORY_CARE_PROVIDER_SITE_OTHER): Payer: PPO

## 2019-07-30 DIAGNOSIS — N3000 Acute cystitis without hematuria: Secondary | ICD-10-CM

## 2019-07-30 NOTE — Progress Notes (Signed)
Patient reports for Urine recheck. Vitals entered into epic.   Labs were entered into EPIC by ordering provider.

## 2019-08-01 LAB — URINALYSIS, ROUTINE W REFLEX MICROSCOPIC
Bilirubin Urine: NEGATIVE
Glucose, UA: NEGATIVE
Hgb urine dipstick: NEGATIVE
Ketones, ur: NEGATIVE
Leukocytes,Ua: NEGATIVE
Nitrite: NEGATIVE
Protein, ur: NEGATIVE
Specific Gravity, Urine: 1.011 (ref 1.001–1.03)
pH: 5.5 (ref 5.0–8.0)

## 2019-08-01 LAB — URINE CULTURE
MICRO NUMBER:: 830142
SPECIMEN QUALITY:: ADEQUATE

## 2019-08-27 ENCOUNTER — Other Ambulatory Visit: Payer: Self-pay

## 2019-08-27 ENCOUNTER — Ambulatory Visit (INDEPENDENT_AMBULATORY_CARE_PROVIDER_SITE_OTHER): Payer: PPO

## 2019-08-27 VITALS — BP 132/76 | HR 88 | Temp 97.3°F | Wt 189.0 lb

## 2019-08-27 DIAGNOSIS — R8290 Unspecified abnormal findings in urine: Secondary | ICD-10-CM | POA: Diagnosis not present

## 2019-08-27 MED ORDER — POTASSIUM CHLORIDE CRYS ER 20 MEQ PO TBCR: 20.0000 meq | EXTENDED_RELEASE_TABLET | Freq: Every day | ORAL | 1 refills | Status: DC

## 2019-08-27 NOTE — Progress Notes (Signed)
Reports for U/C per last office visit LAB note.  Vitals entered into epic.  POTASSIUM CHLORIDE 20 MEQ was refilled & sent to pharmacy on [facility].  All questions answered.

## 2019-08-29 LAB — URINE CULTURE
MICRO NUMBER:: 930080
SPECIMEN QUALITY:: ADEQUATE

## 2019-08-31 NOTE — Progress Notes (Signed)
08/31/2019--Patient is aware of lab results. -e welch.

## 2019-09-04 ENCOUNTER — Other Ambulatory Visit: Payer: Self-pay

## 2019-09-04 DIAGNOSIS — Z1211 Encounter for screening for malignant neoplasm of colon: Secondary | ICD-10-CM

## 2019-09-04 LAB — POC HEMOCCULT BLD/STL (HOME/3-CARD/SCREEN)
Card #2 Fecal Occult Blod, POC: NEGATIVE
Card #3 Fecal Occult Blood, POC: NEGATIVE
Fecal Occult Blood, POC: NEGATIVE

## 2019-09-05 DIAGNOSIS — Z1211 Encounter for screening for malignant neoplasm of colon: Secondary | ICD-10-CM | POA: Diagnosis not present

## 2019-10-15 DIAGNOSIS — H11823 Conjunctivochalasis, bilateral: Secondary | ICD-10-CM | POA: Diagnosis not present

## 2019-10-15 DIAGNOSIS — G510 Bell's palsy: Secondary | ICD-10-CM | POA: Diagnosis not present

## 2019-10-15 DIAGNOSIS — H18413 Arcus senilis, bilateral: Secondary | ICD-10-CM | POA: Diagnosis not present

## 2019-10-15 DIAGNOSIS — H401111 Primary open-angle glaucoma, right eye, mild stage: Secondary | ICD-10-CM | POA: Diagnosis not present

## 2019-10-15 DIAGNOSIS — H11153 Pinguecula, bilateral: Secondary | ICD-10-CM | POA: Diagnosis not present

## 2019-10-15 DIAGNOSIS — H16223 Keratoconjunctivitis sicca, not specified as Sjogren's, bilateral: Secondary | ICD-10-CM | POA: Diagnosis not present

## 2019-10-15 DIAGNOSIS — Z961 Presence of intraocular lens: Secondary | ICD-10-CM | POA: Diagnosis not present

## 2019-10-15 DIAGNOSIS — H0102A Squamous blepharitis right eye, upper and lower eyelids: Secondary | ICD-10-CM | POA: Diagnosis not present

## 2019-10-18 ENCOUNTER — Encounter: Payer: Self-pay | Admitting: Adult Health

## 2019-10-18 DIAGNOSIS — E669 Obesity, unspecified: Secondary | ICD-10-CM | POA: Insufficient documentation

## 2019-10-18 NOTE — Progress Notes (Signed)
FOLLOW UP  Assessment and Plan:   Hypertension Fairly controlled with current medications  Start checking BP at home Monitor blood pressure at home; patient to call if consistently greater than 130/80 Continue DASH diet.   Reminder to go to the ER if any CP, SOB, nausea, dizziness, severe HA, changes vision/speech, left arm numbness and tingling and jaw pain.  Cholesterol Currently at goal; continue atorvastatin  Continue low cholesterol diet and exercise.  Check lipid panel.   Prediabetes Continue diet and exercise.  Perform daily foot/skin check, notify office of any concerning changes.  Check A1C  Nephrotic syndrome/CKD I Increase fluids, avoid NSAIDS, monitor sugars, will monitor (CMP/GFR)  Obesity with co morbidities Long discussion about weight loss, diet, and exercise Recommended diet heavy in fruits and veggies and low in animal meats, cheeses, and dairy products, appropriate calorie intake Discussed ideal weight for height and initial weight goal  Will follow up in 3 months  Vitamin D Def At goal at last visit; continue supplementation to maintain goal of 60-100 Defer Vit D level  Continue diet and meds as discussed. Further disposition pending results of labs. Discussed med's effects and SE's.   Over 30 minutes of exam, counseling, chart review, and critical decision making was performed.   Future Appointments  Date Time Provider [place]  01/21/2020  2:30 PM Unk Pinto, MD [facility] None  04/21/2020 11:15 AM Vicie Mutters, PA-C [facility] None  07/23/2020  9:00 AM Unk Pinto, MD [facility] None    ----------------------------------------------------------------------------------------------------------------------  HPI 68 y.o. female  presents for 3 month follow up on hypertension, cholesterol, prediabetes, obesity and vitamin D deficiency.   She works for [employer], normally [place]- Friday. She states that they want her to start working  every other weekend, however her husband has severe COPD and back pain. She has someone that can help him during the week but she needs to be with him on the weekends to take him to appointments, help him with medications, and be there in case of a flare.   BMI is Body mass index is 33.52 kg/m., she has been working on diet and exercise, walking her dog 1/2 mile twice a week, walking a lot at work as well. She eats a light breakfast and snack at work, then 1 large meal after work.  Wt Readings from Last 3 Encounters:  10/22/19 189 lb 3.2 oz (85.8 kg)  08/27/19 189 lb (85.7 kg)  07/30/19 191 lb 9.6 oz (86.9 kg)   She is not currently checking BPs at home, does have cuff, today their BP is BP: 136/74  She does workout. She denies chest pain, shortness of breath, dizziness.   She is on cholesterol medication Atorvastatin 40 mg daily and denies myalgias. Her cholesterol is at goal. The cholesterol last visit was:   Lab Results  Component Value Date   CHOL 138 07/16/2019   HDL 45 (L) 07/16/2019   LDLCALC 76 07/16/2019   LDLDIRECT 160.1 07/18/2009   TRIG 83 07/16/2019   CHOLHDL 3.1 07/16/2019    She has not been working on diet and exercise for prediabetes, and denies increased appetite, nausea, paresthesia of the feet, polydipsia, polyuria and visual disturbances. Last A1C in the office was:  Lab Results  Component Value Date   HGBA1C 6.3 (H) 07/18/2019    She has hx of nephrotic syndrome and renal functions are monitored closely: Lab Results  Component Value Date   GFRAA 94 07/16/2019   Patient is on Vitamin D supplement.  Lab Results  Component Value Date   VD25OH 71 07/16/2019        Current Medications:  Current Outpatient Medications on File Prior to Visit  Medication Sig  . acetaminophen (TYLENOL) 500 MG tablet Take 500 mg by mouth every 6 (six) hours as needed for mild pain or headache.   Marland Kitchen aspirin EC 81 MG tablet Take 81 mg by mouth daily.  Marland Kitchen atorvastatin (LIPITOR) 40  MG tablet TAKE 1 TABLET(40 MG) BY MOUTH DAILY  . Cholecalciferol (VITAMIN D PO) Take 5,000 Units by mouth daily.  . cyclobenzaprine (FLEXERIL) 10 MG tablet Take 1 tablet (10 mg total) by mouth 3 (three) times daily as needed for muscle spasms.  . fluconazole (DIFLUCAN) 150 MG tablet Take 1 tablet 2 x /week  As needed for yeast infection  . ketoconazole (NIZORAL) 2 % cream Apply 1 application topically daily. Apply to rash 2 x /day  . losartan-hydrochlorothiazide (HYZAAR) 100-25 MG tablet Take 1/2 to 1 tablet daily for BP  . Magnesium 400 MG TABS Take 1 tablet by mouth daily.  . potassium chloride SA (K-DUR) 20 MEQ tablet Take 1 tablet (20 mEq total) by mouth daily.   No current facility-administered medications on file prior to visit.      Allergies:  Allergies  Allergen Reactions  . Ace Inhibitors   . Naproxen     REACTION: renal failure  . Norvasc [Amlodipine Besylate]     edema  . Nsaids      Medical History:  Past Medical History:  Diagnosis Date  . CAP (community acquired pneumonia) 11/12/2015  . Hyperlipidemia   . Hypertension   . Prediabetes   . Vitamin D deficiency    Family history- Reviewed and unchanged Social history- Reviewed and unchanged   Review of Systems:  Review of Systems  Constitutional: Negative for malaise/fatigue and weight loss.  HENT: Negative for hearing loss and tinnitus.   Eyes: Negative for blurred vision and double vision.  Respiratory: Negative for cough, shortness of breath and wheezing.   Cardiovascular: Negative for chest pain, palpitations, orthopnea, claudication and leg swelling.  Gastrointestinal: Negative for abdominal pain, blood in stool, constipation, diarrhea, heartburn, melena, nausea and vomiting.  Genitourinary: Negative.   Musculoskeletal: Negative for joint pain and myalgias.  Skin: Negative for rash.  Neurological: Negative for dizziness, tingling, sensory change, weakness and headaches.  Endo/Heme/Allergies: Negative  for polydipsia.  Psychiatric/Behavioral: Negative.   All other systems reviewed and are negative.     Physical Exam: BP 136/74   Pulse 68   Temp (!) 97.1 F (36.2 C)   Resp 16   Wt 189 lb 3.2 oz (85.8 kg)   BMI 33.52 kg/m  Wt Readings from Last 3 Encounters:  10/22/19 189 lb 3.2 oz (85.8 kg)  08/27/19 189 lb (85.7 kg)  07/30/19 191 lb 9.6 oz (86.9 kg)   General Appearance: Well nourished, in no apparent distress. Eyes: PERRLA, EOMs, conjunctiva no swelling or erythema Sinuses: No Frontal/maxillary tenderness ENT/Mouth: Ext aud canals clear, TMs without erythema, bulging. No erythema, swelling, or exudate on post pharynx.  Tonsils not swollen or erythematous. Hearing normal.  Neck: Supple, thyroid normal.  Respiratory: Respiratory effort normal, BS equal bilaterally without rales, rhonchi, wheezing or stridor.  Cardio: RRR with no MRGs. Brisk peripheral pulses without edema.  Abdomen: Soft, + BS.  Non tender, no guarding, rebound, hernias, masses. Lymphatics: Non tender without lymphadenopathy.  Musculoskeletal: Full ROM, 5/5 strength, Normal gait Skin: Warm, dry without rashes, lesions, ecchymosis.  Neuro: Cranial nerves intact. No cerebellar symptoms.  Psych: Awake and oriented X 3, normal affect, Insight and Judgment appropriate.    Izora Ribas, NP 11:14 AM [facility]

## 2019-10-22 ENCOUNTER — Other Ambulatory Visit: Payer: Self-pay

## 2019-10-22 ENCOUNTER — Ambulatory Visit (INDEPENDENT_AMBULATORY_CARE_PROVIDER_SITE_OTHER): Payer: PPO | Admitting: Adult Health

## 2019-10-22 ENCOUNTER — Encounter: Payer: Self-pay | Admitting: Adult Health

## 2019-10-22 VITALS — BP 136/74 | HR 68 | Temp 97.1°F | Resp 16 | Wt 189.2 lb

## 2019-10-22 DIAGNOSIS — E782 Mixed hyperlipidemia: Secondary | ICD-10-CM

## 2019-10-22 DIAGNOSIS — Z23 Encounter for immunization: Secondary | ICD-10-CM

## 2019-10-22 DIAGNOSIS — E669 Obesity, unspecified: Secondary | ICD-10-CM | POA: Diagnosis not present

## 2019-10-22 DIAGNOSIS — E66811 Obesity, class 1: Secondary | ICD-10-CM

## 2019-10-22 DIAGNOSIS — E559 Vitamin D deficiency, unspecified: Secondary | ICD-10-CM | POA: Diagnosis not present

## 2019-10-22 DIAGNOSIS — I10 Essential (primary) hypertension: Secondary | ICD-10-CM

## 2019-10-22 DIAGNOSIS — R7309 Other abnormal glucose: Secondary | ICD-10-CM

## 2019-10-22 DIAGNOSIS — Z79899 Other long term (current) drug therapy: Secondary | ICD-10-CM | POA: Diagnosis not present

## 2019-10-22 DIAGNOSIS — N182 Chronic kidney disease, stage 2 (mild): Secondary | ICD-10-CM

## 2019-10-22 MED ORDER — ATORVASTATIN CALCIUM 40 MG PO TABS: ORAL_TABLET | ORAL | 0 refills | Status: DC

## 2019-10-22 MED ORDER — LOSARTAN POTASSIUM-HCTZ 100-25 MG PO TABS: ORAL_TABLET | ORAL | 1 refills | Status: DC

## 2019-10-22 NOTE — Patient Instructions (Addendum)
Goals    . Blood Pressure < 130/80      Tracking WEEKLY average weights - day to day changes are normal and depends on last bowel movement and salt intake     Drink 1/2 your body weight in fluid ounces of water daily; drink a tall glass of water 30 min before meals  Don't eat until you're stuffed- listen to your stomach and eat until you are 80% full   Try eating off of a salad plate; wait 10 min after finishing before going back for seconds  Start by eating the vegetables on your plate; aim for 50% of your meals to be fruits or vegetables  Then eat your protein - lean meats (grass fed if possible), fish, beans, nuts in moderation  Eat your carbs/starch last ONLY if you still are hungry. If you can, stop before finishing it all  Avoid sugar and flour - the closer it looks to it's original form in nature, typically the better it is for you  Splurge in moderation - "assign" days when you get to splurge and have the "bad stuff" - I like to follow a 80% - 20% plan- "good" choices 80 % of the time, "bad" choices in moderation 20% of the time  Simple equation is: Calories out > calories in = weight loss - even if you eat the bad stuff, if you limit portions, you will still lose weight

## 2019-10-23 LAB — COMPLETE METABOLIC PANEL WITH GFR
AG Ratio: 1.4 (calc) (ref 1.0–2.5)
ALT: 10 U/L (ref 6–29)
AST: 16 U/L (ref 10–35)
Albumin: 4 g/dL (ref 3.6–5.1)
Alkaline phosphatase (APISO): 71 U/L (ref 37–153)
BUN: 9 mg/dL (ref 7–25)
CO2: 27 mmol/L (ref 20–32)
Calcium: 8.9 mg/dL (ref 8.6–10.4)
Chloride: 106 mmol/L (ref 98–110)
Creat: 0.76 mg/dL (ref 0.50–0.99)
GFR, Est African American: 93 mL/min/{1.73_m2} (ref >=60)
GFR, Est Non African American: 80 mL/min/{1.73_m2} (ref >=60)
Globulin: 2.9 g/dL (calc) (ref 1.9–3.7)
Glucose, Bld: 96 mg/dL (ref 65–99)
Potassium: 4.1 mmol/L (ref 3.5–5.3)
Sodium: 142 mmol/L (ref 135–146)
Total Bilirubin: 0.6 mg/dL (ref 0.2–1.2)
Total Protein: 6.9 g/dL (ref 6.1–8.1)

## 2019-10-23 LAB — CBC WITH DIFFERENTIAL/PLATELET
Absolute Monocytes: 659 cells/uL (ref 200–950)
Basophils Absolute: 37 cells/uL (ref 0–200)
Basophils Relative: 0.5 %
Eosinophils Absolute: 148 cells/uL (ref 15–500)
Eosinophils Relative: 2 %
HCT: 37.3 % (ref 35.0–45.0)
Hemoglobin: 11.9 g/dL (ref 11.7–15.5)
Lymphs Abs: 2420 cells/uL (ref 850–3900)
MCH: 26.2 pg — ABNORMAL LOW (ref 27.0–33.0)
MCHC: 31.9 g/dL — ABNORMAL LOW (ref 32.0–36.0)
MCV: 82.2 fL (ref 80.0–100.0)
MPV: 9.9 fL (ref 7.5–12.5)
Monocytes Relative: 8.9 %
Neutro Abs: 4137 cells/uL (ref 1500–7800)
Neutrophils Relative %: 55.9 %
Platelets: 274 10*3/uL (ref 140–400)
RBC: 4.54 10*6/uL (ref 3.80–5.10)
RDW: 14.5 % (ref 11.0–15.0)
Total Lymphocyte: 32.7 %
WBC: 7.4 10*3/uL (ref 3.8–10.8)

## 2019-10-23 LAB — TSH: TSH: 1.33 mIU/L (ref 0.40–4.50)

## 2019-10-23 LAB — LIPID PANEL
Cholesterol: 114 mg/dL (ref <200)
HDL: 41 mg/dL — ABNORMAL LOW (ref >=50)
LDL Cholesterol (Calc): 59 mg/dL (calc)
Non-HDL Cholesterol (Calc): 73 mg/dL (calc) (ref <130)
Total CHOL/HDL Ratio: 2.8 (calc) (ref <5.0)
Triglycerides: 64 mg/dL (ref <150)

## 2019-10-23 LAB — HEMOGLOBIN A1C
Hgb A1c MFr Bld: 6.3 % of total Hgb — ABNORMAL HIGH (ref <5.7)
Mean Plasma Glucose: 134 (calc)
eAG (mmol/L): 7.4 (calc)

## 2019-10-23 LAB — MAGNESIUM: Magnesium: 1.8 mg/dL (ref 1.5–2.5)

## 2020-01-07 ENCOUNTER — Other Ambulatory Visit: Payer: Self-pay | Admitting: Internal Medicine

## 2020-01-07 DIAGNOSIS — Z1231 Encounter for screening mammogram for malignant neoplasm of breast: Secondary | ICD-10-CM

## 2020-01-14 DIAGNOSIS — H0102B Squamous blepharitis left eye, upper and lower eyelids: Secondary | ICD-10-CM | POA: Diagnosis not present

## 2020-01-14 DIAGNOSIS — H401111 Primary open-angle glaucoma, right eye, mild stage: Secondary | ICD-10-CM | POA: Diagnosis not present

## 2020-01-14 DIAGNOSIS — H11823 Conjunctivochalasis, bilateral: Secondary | ICD-10-CM | POA: Diagnosis not present

## 2020-01-14 DIAGNOSIS — H0102A Squamous blepharitis right eye, upper and lower eyelids: Secondary | ICD-10-CM | POA: Diagnosis not present

## 2020-01-14 DIAGNOSIS — H11153 Pinguecula, bilateral: Secondary | ICD-10-CM | POA: Diagnosis not present

## 2020-01-14 DIAGNOSIS — G510 Bell's palsy: Secondary | ICD-10-CM | POA: Diagnosis not present

## 2020-01-14 DIAGNOSIS — H18413 Arcus senilis, bilateral: Secondary | ICD-10-CM | POA: Diagnosis not present

## 2020-01-14 DIAGNOSIS — H16223 Keratoconjunctivitis sicca, not specified as Sjogren's, bilateral: Secondary | ICD-10-CM | POA: Diagnosis not present

## 2020-01-14 DIAGNOSIS — H40022 Open angle with borderline findings, high risk, left eye: Secondary | ICD-10-CM | POA: Diagnosis not present

## 2020-01-21 ENCOUNTER — Ambulatory Visit: Payer: PPO | Admitting: Internal Medicine

## 2020-01-22 ENCOUNTER — Ambulatory Visit: Payer: PPO | Admitting: Internal Medicine

## 2020-01-27 ENCOUNTER — Encounter: Payer: Self-pay | Admitting: Internal Medicine

## 2020-01-27 NOTE — Patient Instructions (Signed)

## 2020-01-27 NOTE — Progress Notes (Signed)
History of Present Illness:       This very nice 69 y.o. [name]  presents for 3 month follow up with HTN, HLD, Pre-Diabetes and Vitamin D Deficiency.       Patient is treated for HTN (2002) & BP has been controlled at home. Today's BP is at goal - 126/82.  In 2011, patient was hospitalized with ARF due to dehydration / NSAID's rescued with IV rehydration  & again in 2016 with CAP & dehydration with AKI  requiring dialysis for recovery to her baseline CKD2.  Patient has had no complaints of any cardiac type chest pain, palpitations, dyspnea / orthopnea / PND, dizziness, claudication, or dependent edema.      Hyperlipidemia is controlled with diet & Atorvastatin. Patient denies myalgias or other med SE's. Last Lipids were at goal:  Lab Results  Component Value Date   CHOL 114 10/22/2019   HDL 41 (L) 10/22/2019   LDLCALC 59 10/22/2019   LDLDIRECT 160.1 07/18/2009   TRIG 64 10/22/2019   CHOLHDL 2.8 10/22/2019    Also, the patient has history of PreDiabetes (A1c 6.2% / 2012)  and has had no symptoms of reactive hypoglycemia, diabetic polys, paresthesias or visual blurring.  Last A1c was not at goal:  Lab Results  Component Value Date   HGBA1C 6.3 (H) 10/22/2019       Further, the patient also has history of Vitamin D Deficiency ("12" / 2012) and supplements vitamin D  without any suspected side-effects. Last vitamin D was at goal:  Lab Results  Component Value Date   VD25OH 71 07/16/2019    Current Outpatient Medications on File Prior to Visit  Medication Sig  . acetaminophen (TYLENOL) 500 MG tablet Take 500 mg by mouth every 6 (six) hours as needed for mild pain or headache.   Marland Kitchen aspirin EC 81 MG tablet Take 81 mg by mouth daily.  Marland Kitchen atorvastatin (LIPITOR) 40 MG tablet TAKE 1 TABLET(40 MG) BY MOUTH DAILY  . Cholecalciferol (VITAMIN D PO) Take 5,000 Units by mouth daily.  . cyclobenzaprine (FLEXERIL) 10 MG tablet Take 1 tablet (10 mg total) by mouth 3 (three) times daily as  needed for muscle spasms.  Marland Kitchen ketoconazole (NIZORAL) 2 % cream Apply 1 application topically daily. Apply to rash 2 x /day  . losartan-hydrochlorothiazide (HYZAAR) 100-25 MG tablet Take 1/2 to 1 tablet daily for BP  . Magnesium 400 MG TABS Take 1 tablet by mouth daily.  . potassium chloride SA (K-DUR) 20 MEQ tablet Take 1 tablet (20 mEq total) by mouth daily.   No current facility-administered medications on file prior to visit.    Allergies  Allergen Reactions  . Ace Inhibitors   . Naproxen     REACTION: renal failure  . Norvasc [Amlodipine Besylate]     edema  . Nsaids     PMHx:   Past Medical History:  Diagnosis Date  . CAP (community acquired pneumonia) 11/12/2015  . Hyperlipidemia   . Hypertension   . Prediabetes   . Vitamin D deficiency     Immunization History  Administered Date(s) Administered  . Influenza, High Dose Seasonal PF 08/28/2018, 10/22/2019  . Influenza,inj,quad, With Preservative 10/25/2016  . Influenza,trivalent, recombinat, inj, PF 09/07/2015  . Influenza-Unspecified 08/30/2015  . Pneumococcal Conjugate-13 01/30/2018  . Pneumococcal Polysaccharide-23 10/29/2015  . Td 02/19/2000    Past Surgical History:  Procedure Laterality Date  . ABDOMINAL HYSTERECTOMY    . EYE SURGERY Left 2017  Cataract sugery left eye    FHx:    Reviewed / unchanged  SHx:    Reviewed / unchanged   Systems Review:  Constitutional: Denies fever, chills, wt changes, headaches, insomnia, fatigue, night sweats, change in appetite. Eyes: Denies redness, blurred vision, diplopia, discharge, itchy, watery eyes.  ENT: Denies discharge, congestion, post nasal drip, epistaxis, sore throat, earache, hearing loss, dental pain, tinnitus, vertigo, sinus pain, snoring.  CV: Denies chest pain, palpitations, irregular heartbeat, syncope, dyspnea, diaphoresis, orthopnea, PND, claudication or edema. Respiratory: denies cough, dyspnea, DOE, pleurisy, hoarseness, laryngitis, wheezing.    Gastrointestinal: Denies dysphagia, odynophagia, heartburn, reflux, water brash, abdominal pain or cramps, nausea, vomiting, bloating, diarrhea, constipation, hematemesis, melena, hematochezia  or hemorrhoids. Genitourinary: Denies dysuria, frequency, urgency, nocturia, hesitancy, discharge, hematuria or flank pain. Musculoskeletal: Denies arthralgias, myalgias, stiffness, jt. swelling, pain, limping or strain/sprain.  Skin: Denies pruritus, rash, hives, warts, acne, eczema or change in skin lesion(s). Neuro: No weakness, tremor, incoordination, spasms, paresthesia or pain. Psychiatric: Denies confusion, memory loss or sensory loss. Endo: Denies change in weight, skin or hair change.  Heme/Lymph: No excessive bleeding, bruising or enlarged lymph nodes.  Physical Exam  BP 126/82   Pulse 60   Temp (!) 97 F (36.1 C)   Resp 16   Ht 5\' 3"  (1.6 m)   Wt 187 lb 9.6 oz (85.1 kg)   BMI 33.23 kg/m   Appears  well nourished, well groomed  and in no distress.  Eyes: PERRLA, EOMs, conjunctiva no swelling or erythema. Sinuses: No frontal/maxillary tenderness ENT/Mouth: EAC's clear, TM's nl w/o erythema, bulging. Nares clear w/o erythema, swelling, exudates. Oropharynx clear without erythema or exudates. Oral hygiene is good. Tongue normal, non obstructing. Hearing intact.  Neck: Supple. Thyroid not palpable. Car 2+/2+ without bruits, nodes or JVD. Chest: Respirations nl with BS clear & equal w/o rales, rhonchi, wheezing or stridor.  Cor: Heart sounds normal w/ regular rate and rhythm without sig. murmurs, gallops, clicks or rubs. Peripheral pulses normal and equal  without edema.  Abdomen: Soft & bowel sounds normal. Non-tender w/o guarding, rebound, hernias, masses or organomegaly.  Lymphatics: Unremarkable.  Musculoskeletal: Full ROM all peripheral extremities, joint stability, 5/5 strength and normal gait.  Skin: Warm, dry without exposed rashes, lesions or ecchymosis apparent.  Neuro:  Cranial nerves intact, reflexes equal bilaterally. Sensory-motor testing grossly intact. Tendon reflexes grossly intact.  Pysch: Alert & oriented x 3.  Insight and judgement nl & appropriate. No ideations.  Assessment and Plan:  1. Essential hypertension  - Continue medication, monitor blood pressure at home.  - Continue DASH diet.  Reminder to go to the ER if any CP,  SOB, nausea, dizziness, severe HA, changes vision/speech.  - CBC with Differential/Platelet - COMPLETE METABOLIC PANEL WITH GFR - Magnesium - TSH  2. Hyperlipidemia, mixed  - Continue diet/meds, exercise,& lifestyle modifications.  - Continue monitor periodic cholesterol/liver & renal functions   - Lipid panel - TSH  3. Abnormal glucose  - Continue diet, exercise  - Lifestyle modifications.  - Monitor appropriate labs.  - Hemoglobin A1c - Insulin, random  4. Vitamin D deficiency  - Continue supplementation.  - VITAMIN D 25 Hydroxy   5. Prediabetes  - Hemoglobin A1c - Insulin, random  6. Chronic kidney disease (CKD) stage 2  - COMPLETE METABOLIC PANEL WITH GFR  7. Medication management  - CBC with Differential/Platelet - COMPLETE METABOLIC PANEL WITH GFR - Magnesium - Lipid panel - TSH - Hemoglobin A1c - Insulin, random -  VITAMIN D 25 Hydroxy         Discussed  regular exercise, BP monitoring, weight control to achieve/maintain BMI less than 25 and discussed med and SE's. Recommended labs to assess and monitor clinical status with further disposition pending results of labs.  I discussed the assessment and treatment plan with the patient. The patient was provided an opportunity to ask questions and all were answered. The patient agreed with the plan and demonstrated an understanding of the instructions.  I provided over 30 minutes of exam, counseling, chart review and  complex critical decision making.   Kirtland Bouchard, MD

## 2020-01-28 ENCOUNTER — Other Ambulatory Visit: Payer: Self-pay

## 2020-01-28 ENCOUNTER — Ambulatory Visit (INDEPENDENT_AMBULATORY_CARE_PROVIDER_SITE_OTHER): Payer: PPO | Admitting: Internal Medicine

## 2020-01-28 VITALS — BP 126/82 | HR 60 | Temp 97.0°F | Resp 16 | Ht 63.0 in | Wt 187.6 lb

## 2020-01-28 DIAGNOSIS — E782 Mixed hyperlipidemia: Secondary | ICD-10-CM

## 2020-01-28 DIAGNOSIS — N182 Chronic kidney disease, stage 2 (mild): Secondary | ICD-10-CM | POA: Diagnosis not present

## 2020-01-28 DIAGNOSIS — E559 Vitamin D deficiency, unspecified: Secondary | ICD-10-CM

## 2020-01-28 DIAGNOSIS — R7309 Other abnormal glucose: Secondary | ICD-10-CM

## 2020-01-28 DIAGNOSIS — I10 Essential (primary) hypertension: Secondary | ICD-10-CM

## 2020-01-28 DIAGNOSIS — R7303 Prediabetes: Secondary | ICD-10-CM

## 2020-01-28 DIAGNOSIS — Z79899 Other long term (current) drug therapy: Secondary | ICD-10-CM | POA: Diagnosis not present

## 2020-01-29 LAB — CBC WITH DIFFERENTIAL/PLATELET
Absolute Monocytes: 524 cells/uL (ref 200–950)
Basophils Absolute: 48 cells/uL (ref 0–200)
Basophils Relative: 0.7 %
Eosinophils Absolute: 122 cells/uL (ref 15–500)
Eosinophils Relative: 1.8 %
HCT: 38.5 % (ref 35.0–45.0)
Hemoglobin: 11.9 g/dL (ref 11.7–15.5)
Lymphs Abs: 2591 cells/uL (ref 850–3900)
MCH: 26 pg — ABNORMAL LOW (ref 27.0–33.0)
MCHC: 30.9 g/dL — ABNORMAL LOW (ref 32.0–36.0)
MCV: 84.1 fL (ref 80.0–100.0)
MPV: 9.3 fL (ref 7.5–12.5)
Monocytes Relative: 7.7 %
Neutro Abs: 3516 cells/uL (ref 1500–7800)
Neutrophils Relative %: 51.7 %
Platelets: 256 10*3/uL (ref 140–400)
RBC: 4.58 10*6/uL (ref 3.80–5.10)
RDW: 13.2 % (ref 11.0–15.0)
Total Lymphocyte: 38.1 %
WBC: 6.8 10*3/uL (ref 3.8–10.8)

## 2020-01-29 LAB — LIPID PANEL
Cholesterol: 136 mg/dL (ref <200)
HDL: 42 mg/dL — ABNORMAL LOW (ref >=50)
LDL Cholesterol (Calc): 78 mg/dL (calc)
Non-HDL Cholesterol (Calc): 94 mg/dL (calc) (ref <130)
Total CHOL/HDL Ratio: 3.2 (calc) (ref <5.0)
Triglycerides: 80 mg/dL (ref <150)

## 2020-01-29 LAB — COMPLETE METABOLIC PANEL WITH GFR
AG Ratio: 1.4 (calc) (ref 1.0–2.5)
ALT: 12 U/L (ref 6–29)
AST: 15 U/L (ref 10–35)
Albumin: 4.2 g/dL (ref 3.6–5.1)
Alkaline phosphatase (APISO): 74 U/L (ref 37–153)
BUN: 9 mg/dL (ref 7–25)
CO2: 26 mmol/L (ref 20–32)
Calcium: 9.4 mg/dL (ref 8.6–10.4)
Chloride: 103 mmol/L (ref 98–110)
Creat: 0.75 mg/dL (ref 0.50–0.99)
GFR, Est African American: 94 mL/min/{1.73_m2} (ref >=60)
GFR, Est Non African American: 81 mL/min/{1.73_m2} (ref >=60)
Globulin: 3.1 g/dL (calc) (ref 1.9–3.7)
Glucose, Bld: 91 mg/dL (ref 65–99)
Potassium: 3.8 mmol/L (ref 3.5–5.3)
Sodium: 140 mmol/L (ref 135–146)
Total Bilirubin: 0.6 mg/dL (ref 0.2–1.2)
Total Protein: 7.3 g/dL (ref 6.1–8.1)

## 2020-01-29 LAB — VITAMIN D 25 HYDROXY (VIT D DEFICIENCY, FRACTURES): Vit D, 25-Hydroxy: 60 ng/mL (ref 30–100)

## 2020-01-29 LAB — MAGNESIUM: Magnesium: 1.8 mg/dL (ref 1.5–2.5)

## 2020-01-29 LAB — INSULIN, RANDOM: Insulin: 8.1 u[IU]/mL

## 2020-01-29 LAB — HEMOGLOBIN A1C
Hgb A1c MFr Bld: 6.3 % of total Hgb — ABNORMAL HIGH (ref <5.7)
Mean Plasma Glucose: 134 (calc)
eAG (mmol/L): 7.4 (calc)

## 2020-01-29 LAB — TSH: TSH: 1.47 mIU/L (ref 0.40–4.50)

## 2020-01-29 NOTE — Progress Notes (Signed)
PATIENT IS AWARE OF LAB RESULTS AND INSTRUCTIONS. -E WELCH

## 2020-02-18 ENCOUNTER — Ambulatory Visit
Admission: RE | Admit: 2020-02-18 | Discharge: 2020-02-18 | Disposition: A | Discharge status: Home / Self Care | Payer: PPO | Source: Ambulatory Visit | Attending: Internal Medicine | Admitting: Internal Medicine

## 2020-02-18 ENCOUNTER — Other Ambulatory Visit: Payer: Self-pay

## 2020-02-18 DIAGNOSIS — Z1231 Encounter for screening mammogram for malignant neoplasm of breast: Secondary | ICD-10-CM

## 2020-03-27 ENCOUNTER — Other Ambulatory Visit: Payer: Self-pay | Admitting: Adult Health

## 2020-04-15 ENCOUNTER — Ambulatory Visit: Payer: PPO | Admitting: Physician Assistant

## 2020-04-21 ENCOUNTER — Ambulatory Visit: Payer: PPO | Admitting: Physician Assistant

## 2020-05-06 NOTE — Progress Notes (Addendum)
WELCOME TO MEDICARE VISIT AND 3 MONTH FOLLOW UP  Assessment:   ANEMIA Last colonoscopy: 2016 due 2026 is postmenopausal, is not on metformin, is not on PPI.  KIDNEY FUNCTION NORMAL AT THIS TIME WILL REFER TO GI FOR NEW ONSET ANEMIA Lab Results  Component Value Date   GFRAA 94 06/09/2020    Encounter for Medicare annual wellness exam 1 year Given information about advanced directives for her and her husband.   Advanced directive counseling.  Discussed advanced care planning with patient and/or family At least 30 mins discussed with the patient and/or family at the visit.   Essential hypertension - continue medications, DASH diet, exercise and monitor at home. Call if greater than 130/80.  -     CBC with Differential/Platelet -     BASIC METABOLIC PANEL WITH GFR -     Hepatic function panel -     TSH  Mixed hyperlipidemia -continue medications, check lipids, decrease fatty foods, increase activity.  -     Lipid panel  Chronic kidney disease (CKD) stage 2 Increase fluids, avoid NSAIDS, monitor sugars, will monitor -     BASIC METABOLIC PANEL WITH GFR  SYNDROME, NEPHROTIC W/MINIMAL CHANGE LESION Will monitor -     BASIC METABOLIC PANEL WITH GFR  Medication management  Vitamin D deficiency Continue supplement  Other abnormal glucose -     Hemoglobin A1c   Over 30 minutes of exam, counseling, chart review, and critical decision making was performed Future Appointments  Date Time Provider [place]  08/12/2020 11:00 AM Unk Pinto, MD [facility] None     Plan:   During the course of the visit the patient was educated and counseled about appropriate screening and preventive services including:    Pneumococcal vaccine   Influenza vaccine  Td vaccine  Prevnar 13  Screening electrocardiogram  Screening mammography  Bone densitometry screening  Colorectal cancer screening  Diabetes screening  Glaucoma screening  Nutrition counseling    Advanced directives: given info/requested copies   Subjective:   Lydia Edwards is a 69 y.o. female who presents for Medicare Annual Wellness Visit and 3 month follow up on hypertension, prediabetes, hyperlipidemia, vitamin D def.   She works for [employer], normally [place]- [place], SHE IS SUPPOSE TO RETIRE BUT SHE WANTS TO WORK ONLY 2-3 DAYS A WEEK.   BMI is Body mass index is 33.48 kg/m., she is working on diet and exercise. She has cut back on sweet tea but is doing better.  Wt Readings from Last 3 Encounters:  05/07/20 189 lb (85.7 kg)  01/28/20 187 lb 9.6 oz (85.1 kg)  10/22/19 189 lb 3.2 oz (85.8 kg)   Her blood pressure has been controlled at home, today their BP is BP: 128/80 She does workout.  She does a lot of walking.  She walks at least a mile daily. She denies chest pain, shortness of breath, dizziness.   She is on cholesterol medication and denies myalgias. Her cholesterol is at goal. The cholesterol last visit was:   Lab Results  Component Value Date   CHOL 136 01/28/2020   HDL 42 (L) 01/28/2020   LDLCALC 78 01/28/2020   LDLDIRECT 160.1 07/18/2009   TRIG 80 01/28/2020   CHOLHDL 3.2 01/28/2020   She has been working on diet and exercise for prediabetes, SHE HAS BEEN DRINKING SWEET TEA, has cut back, and denies foot ulcerations, hyperglycemia, hypoglycemia , increased appetite, nausea, paresthesia of the feet, polydipsia, polyuria, visual disturbances, vomiting and weight loss.  Last A1C in the office was:  Lab Results  Component Value Date   HGBA1C 6.3 (H) 01/28/2020   Patient is on Vitamin D supplement. Lab Results  Component Value Date   VD25OH 60 01/28/2020     She has a history of nephrotic syndrome in the past, saw Dr. Lorrene Reid for that, has been discharged and we are still monitoring kidney functions.  Lab Results  Component Value Date   GFRAA 94 01/28/2020    Medication Review   Current Outpatient Medications (Cardiovascular):  .  atorvastatin  (LIPITOR) 40 MG tablet, TAKE 1 TABLET(40 MG) DAILY FOR CHOLESTEROL. Marland Kitchen  losartan-hydrochlorothiazide (HYZAAR) 100-25 MG tablet, Take 1/2 to 1 tablet daily for BP (Patient not taking: Reported on 05/07/2020)   Current Outpatient Medications (Analgesics):  .  acetaminophen (TYLENOL) 500 MG tablet, Take 500 mg by mouth every 6 (six) hours as needed for mild pain or headache.  Marland Kitchen  aspirin EC 81 MG tablet, Take 81 mg by mouth daily.   Current Outpatient Medications (Other):  Marland Kitchen  Cholecalciferol (VITAMIN D PO), Take 5,000 Units by mouth daily. .  cyclobenzaprine (FLEXERIL) 10 MG tablet, Take 1 tablet (10 mg total) by mouth 3 (three) times daily as needed for muscle spasms. .  Magnesium 400 MG TABS, Take 1 tablet by mouth daily. .  potassium chloride SA (K-DUR) 20 MEQ tablet, Take 1 tablet (20 mEq total) by mouth daily. (Patient not taking: Reported on 05/07/2020)  Current Problems (verified) Patient Active Problem List   Diagnosis Date Noted  . Obesity (BMI 30.0-34.9) 10/18/2019  . FHx: heart disease 08/28/2018  . Other abnormal glucose 10/25/2016  . Chronic kidney disease (CKD) stage 2 07/19/2016  . Encounter for Medicare annual wellness exam 10/06/2015  . Medication management 09/02/2014  . Hyperlipidemia   . Hypertension   . Vitamin D deficiency   . SYNDROME, NEPHROTIC W/MINIMAL CHANGE LESION 07/12/2007    Screening Tests Immunization History  Administered Date(s) Administered  . Influenza, High Dose Seasonal PF 08/28/2018, 10/22/2019  . Influenza,inj,quad, With Preservative 10/25/2016  . Influenza,trivalent, recombinat, inj, PF 09/07/2015  . Influenza-Unspecified 08/30/2015  . Pneumococcal Conjugate-13 01/30/2018  . Pneumococcal Polysaccharide-23 10/29/2015  . Td 02/19/2000   Health Maintenance  Topic Date Due  . COVID-19 Vaccine (1) Never done  . TETANUS/TDAP  02/18/2010  . INFLUENZA VACCINE  06/29/2020  . MAMMOGRAM  02/17/2022  . COLONOSCOPY  09/09/2025  . DEXA SCAN   Completed  . Hepatitis C Screening  Completed  . PNA vac Low Risk Adult  Completed     Preventative care: Last colonoscopy: 08/2015 Last mammogram: 12/2018 Last pap smear/pelvic exam:   declines DEXA: 05/2016 normal -0.6  Prior vaccinations: TD or Tdap: PATIENT IS DUE- will get next visit and bring money Influenza: 2020 Pneumococcal: 2016 Prevnar13: 2019 Shingles/Zostavax: declines COVID vaccine: got- will call with dates  Names of Other Physician/Practitioners you currently use: 1. [facility]- here for primary care 2. Dr. Will Bonnet, eye doctor, 2021- has seen bevis in the past 3. [facility], last visit 2018 Patient Care Team: Unk Pinto, MD as PCP - General (Internal Medicine) Vania Rea, MD as Consulting Physician (Obstetrics and Gynecology) Lafayette Dragon, MD (Inactive) as Consulting Physician (Gastroenterology)  Allergies Allergies  Allergen Reactions  . Ace Inhibitors   . Naproxen     REACTION: renal failure  . Norvasc [Amlodipine Besylate]     edema  . Nsaids     SURGICAL HISTORY She  has a past surgical history that includes Abdominal hysterectomy and Eye surgery (Left, 2017). FAMILY HISTORY Her family history includes Cancer in her sister; Diabetes in her sister; Heart attack in her brother; Hypertension in her sister and sister; Kidney disease in her brother and sister; Stroke in her sister.  Heart attack brother 62 SOCIAL HISTORY She  reports that she has never smoked. She has never used smokeless tobacco. She reports that she does not drink alcohol or use drugs.  MEDICARE WELLNESS OBJECTIVES: Physical activity: Current Exercise Habits: Home exercise routine, Type of exercise: walking, Time (Minutes): 20, Frequency (Times/Week): 4, Weekly Exercise (Minutes/Week): 80, Intensity: Mild Cardiac risk factors: Cardiac Risk Factors include: advanced age (>36men, >28  women);dyslipidemia;hypertension;obesity (BMI >30kg/m2);sedentary lifestyle;family history of premature cardiovascular disease Depression/mood screen:   Depression screen [facility] 2/9 05/07/2020  Decreased Interest 0  Down, Depressed, Hopeless 0  PHQ - 2 Score 0    ADLs:  In your present state of health, do you have any difficulty performing the following activities: 05/07/2020 07/15/2019  Hearing? N N  Vision? N N  Difficulty concentrating or making decisions? N N  Walking or climbing stairs? N N  Dressing or bathing? N N  Doing errands, shopping? N N  Some recent data might be hidden     Cognitive Testing  Alert? Yes  Normal Appearance?Yes  Oriented to person? Yes  Place? Yes   Time? Yes  Recall of three objects?  Yes  Can perform simple calculations? Yes  Displays appropriate judgment?Yes  Can read the correct time from a watch face?Yes  EOL planning: Does Patient Have a Medical Advance Directive?: No Does patient want to make changes to medical advance directive?: Yes (MAU/Ambulatory/Procedural Areas - Information given)   Review of Systems  Constitutional: Negative for chills, fever, malaise/fatigue and weight loss.  HENT: Negative for congestion, ear pain and sore throat.   Eyes: Negative.   Respiratory: Negative for cough, shortness of breath and wheezing.   Cardiovascular: Negative for chest pain, palpitations and leg swelling.  Gastrointestinal: Negative for blood in stool, constipation, diarrhea, heartburn and melena.  Genitourinary: Negative for dysuria, frequency, hematuria and urgency.  Musculoskeletal: Positive for joint pain.  Skin: Negative.   Neurological: Negative for dizziness, sensory change, loss of consciousness and headaches.  Psychiatric/Behavioral: Negative for depression. The patient is not nervous/anxious and does not have insomnia.     Objective:   Today's Vitals   05/07/20 1359  BP: 128/80  Pulse: 70  Temp: (!) 97.5 F (36.4 C)  SpO2: 98%   Weight: 189 lb (85.7 kg)  Height: 5\' 3"  (1.6 m)  PainSc: 0-No pain   Body mass index is 33.48 kg/m.  General appearance: alert, no distress, WD/WN,  female HEENT: normocephalic, sclerae anicteric, TMs pearly, nares patent, no discharge or erythema, pharynx normal Oral cavity: MMM, no lesions Neck: supple, no lymphadenopathy, no thyromegaly, no masses Heart: RRR, normal S1, S2, no murmurs Lungs: CTA bilaterally, no wheezes, rhonchi, or rales Abdomen: +bs, soft, non tender, non distended, no masses, no hepatomegaly, no splenomegaly Musculoskeletal: nontender, no swelling, no obvious deformity Extremities: no edema, no cyanosis, no clubbing Pulses: 2+ symmetric, upper and lower extremities, normal cap refill Neurological: alert, oriented x 3, CN2-12 intact, strength normal upper extremities and lower extremities, sensation normal throughout, DTRs 2+ throughout, no cerebellar signs, gait normal Psychiatric: normal affect, behavior normal, pleasant  Breast: defer Gyn: defer Rectal: defer   Medicare Attestation I have personally reviewed: The patient's medical and social history  Their use of alcohol, tobacco or illicit drugs Their current medications and supplements The patient's functional ability including ADLs,fall risks, home safety risks, cognitive, and hearing and visual impairment Diet and physical activities Evidence for depression or mood disorders  The patient's weight, height, BMI, and visual acuity have been recorded in the chart.  I have made referrals, counseling, and provided education to the patient based on review of the above and I have provided the patient with a written personalized care plan for preventive services.     Vicie Mutters, PA-C   05/07/2020

## 2020-05-07 ENCOUNTER — Encounter: Payer: Self-pay | Admitting: Physician Assistant

## 2020-05-07 ENCOUNTER — Ambulatory Visit (INDEPENDENT_AMBULATORY_CARE_PROVIDER_SITE_OTHER): Payer: PPO | Admitting: Physician Assistant

## 2020-05-07 ENCOUNTER — Other Ambulatory Visit: Payer: Self-pay

## 2020-05-07 VITALS — BP 128/80 | HR 70 | Temp 97.5°F | Ht 63.0 in | Wt 189.0 lb

## 2020-05-07 DIAGNOSIS — I10 Essential (primary) hypertension: Secondary | ICD-10-CM | POA: Diagnosis not present

## 2020-05-07 DIAGNOSIS — R7309 Other abnormal glucose: Secondary | ICD-10-CM

## 2020-05-07 DIAGNOSIS — Z7189 Other specified counseling: Secondary | ICD-10-CM

## 2020-05-07 DIAGNOSIS — D649 Anemia, unspecified: Secondary | ICD-10-CM

## 2020-05-07 DIAGNOSIS — E559 Vitamin D deficiency, unspecified: Secondary | ICD-10-CM

## 2020-05-07 DIAGNOSIS — R6889 Other general symptoms and signs: Secondary | ICD-10-CM | POA: Diagnosis not present

## 2020-05-07 DIAGNOSIS — E669 Obesity, unspecified: Secondary | ICD-10-CM | POA: Diagnosis not present

## 2020-05-07 DIAGNOSIS — N182 Chronic kidney disease, stage 2 (mild): Secondary | ICD-10-CM | POA: Diagnosis not present

## 2020-05-07 DIAGNOSIS — Z79899 Other long term (current) drug therapy: Secondary | ICD-10-CM

## 2020-05-07 DIAGNOSIS — E538 Deficiency of other specified B group vitamins: Secondary | ICD-10-CM

## 2020-05-07 DIAGNOSIS — E782 Mixed hyperlipidemia: Secondary | ICD-10-CM

## 2020-05-07 DIAGNOSIS — Z0001 Encounter for general adult medical examination with abnormal findings: Secondary | ICD-10-CM | POA: Diagnosis not present

## 2020-05-07 DIAGNOSIS — Z0000 Encounter for general adult medical examination without abnormal findings: Secondary | ICD-10-CM

## 2020-05-07 DIAGNOSIS — N040 Nephrotic syndrome with minor glomerular abnormality: Secondary | ICD-10-CM | POA: Diagnosis not present

## 2020-05-07 NOTE — Patient Instructions (Addendum)
Regular tetanus shot is 62 dollars If you cut yourself on something rusty or dirty you need the shot- good for 10 years.   CALL WITH COVID VACCINE DATES  General eating tips  What to Avoid . Avoid added sugars o Often added sugar can be found in processed foods such as many condiments, dry cereals, cakes, cookies, chips, crisps, crackers, candies, sweetened drinks, etc.  o Read labels and AVOID/DECREASE use of foods with the following in their ingredient list: Sugar, fructose, high fructose corn syrup, sucrose, glucose, maltose, dextrose, molasses, cane sugar, brown sugar, any type of syrup, agave nectar, etc.   . Avoid snacking in between meals- drink water or if you feel you need a snack, pick a high water content snack such as cucumbers, watermelon, or any veggie.  Marland Kitchen Avoid foods made with flour o If you are going to eat food made with flour, choose those made with whole-grains; and, minimize your consumption as much as is tolerable . Avoid processed foods o These foods are generally stocked in the middle of the grocery store.  o Focus on shopping on the perimeter of the grocery.  What to Include . Vegetables o GREEN LEAFY VEGETABLES: Kale, spinach, mustard greens, collard greens, cabbage, broccoli, etc. o OTHER: Asparagus, cauliflower, eggplant, carrots, peas, Brussel sprouts, tomatoes, bell peppers, zucchini, beets, cucumbers, etc. . Grains, seeds, and legumes o Beans: kidney beans, black eyed peas, garbanzo beans, black beans, pinto beans, etc. o Whole, unrefined grains: brown rice, barley, bulgur, oatmeal, etc. . Healthy fats  o Avoid highly processed fats such as vegetable oil o Examples of healthy fats: avocado, olives, virgin olive oil, dark chocolate (?72% Cocoa), nuts (peanuts, almonds, walnuts, cashews, pecans, etc.) o Please still do small amount of these healthy fats, they are dense in calories.  . Low - Moderate Intake of Animal Sources of Protein o Meat sources: chicken,  [place], salmon, tuna. Limit to 4 ounces of meat at one time or the size of your palm. o Consider limiting dairy sources, but when choosing dairy focus on: PLAIN [place] yogurt, cottage cheese, high-protein milk . Fruit o Choose berries

## 2020-05-08 NOTE — Addendum Note (Signed)
Addended by: Vicie Mutters R on: 05/08/2020 08:17 AM   Modules accepted: Orders

## 2020-05-10 LAB — COMPLETE METABOLIC PANEL WITH GFR
AG Ratio: 1.6 (calc) (ref 1.0–2.5)
ALT: 14 U/L (ref 6–29)
AST: 19 U/L (ref 10–35)
Albumin: 4.2 g/dL (ref 3.6–5.1)
Alkaline phosphatase (APISO): 75 U/L (ref 37–153)
BUN: 10 mg/dL (ref 7–25)
CO2: 31 mmol/L (ref 20–32)
Calcium: 9.2 mg/dL (ref 8.6–10.4)
Chloride: 104 mmol/L (ref 98–110)
Creat: 0.72 mg/dL (ref 0.50–0.99)
GFR, Est African American: 99 mL/min/{1.73_m2} (ref >=60)
GFR, Est Non African American: 85 mL/min/{1.73_m2} (ref >=60)
Globulin: 2.6 g/dL (calc) (ref 1.9–3.7)
Glucose, Bld: 78 mg/dL (ref 65–99)
Potassium: 3.9 mmol/L (ref 3.5–5.3)
Sodium: 141 mmol/L (ref 135–146)
Total Bilirubin: 0.4 mg/dL (ref 0.2–1.2)
Total Protein: 6.8 g/dL (ref 6.1–8.1)

## 2020-05-10 LAB — CBC WITH DIFFERENTIAL/PLATELET
Absolute Monocytes: 798 cells/uL (ref 200–950)
Basophils Absolute: 40 cells/uL (ref 0–200)
Basophils Relative: 0.5 %
Eosinophils Absolute: 221 cells/uL (ref 15–500)
Eosinophils Relative: 2.8 %
HCT: 34.8 % — ABNORMAL LOW (ref 35.0–45.0)
Hemoglobin: 10.7 g/dL — ABNORMAL LOW (ref 11.7–15.5)
Lymphs Abs: 2955 cells/uL (ref 850–3900)
MCH: 26 pg — ABNORMAL LOW (ref 27.0–33.0)
MCHC: 30.7 g/dL — ABNORMAL LOW (ref 32.0–36.0)
MCV: 84.5 fL (ref 80.0–100.0)
MPV: 9.4 fL (ref 7.5–12.5)
Monocytes Relative: 10.1 %
Neutro Abs: 3887 cells/uL (ref 1500–7800)
Neutrophils Relative %: 49.2 %
Platelets: 268 10*3/uL (ref 140–400)
RBC: 4.12 10*6/uL (ref 3.80–5.10)
RDW: 13.2 % (ref 11.0–15.0)
Total Lymphocyte: 37.4 %
WBC: 7.9 10*3/uL (ref 3.8–10.8)

## 2020-05-10 LAB — TEST AUTHORIZATION

## 2020-05-10 LAB — HEMOGLOBIN A1C
Hgb A1c MFr Bld: 5.8 % of total Hgb — ABNORMAL HIGH (ref <5.7)
Mean Plasma Glucose: 120 (calc)
eAG (mmol/L): 6.6 (calc)

## 2020-05-10 LAB — LIPID PANEL
Cholesterol: 119 mg/dL (ref <200)
HDL: 45 mg/dL — ABNORMAL LOW (ref >=50)
LDL Cholesterol (Calc): 62 mg/dL (calc)
Non-HDL Cholesterol (Calc): 74 mg/dL (calc) (ref <130)
Total CHOL/HDL Ratio: 2.6 (calc) (ref <5.0)
Triglycerides: 42 mg/dL (ref <150)

## 2020-05-10 LAB — IRON,TIBC AND FERRITIN PANEL
%SAT: 9 % (calc) — ABNORMAL LOW (ref 16–45)
Ferritin: 7 ng/mL — ABNORMAL LOW (ref 16–288)
Iron: 32 ug/dL — ABNORMAL LOW (ref 45–160)
TIBC: 370 mcg/dL (calc) (ref 250–450)

## 2020-05-10 LAB — TSH: TSH: 1.7 mIU/L (ref 0.40–4.50)

## 2020-05-12 DIAGNOSIS — Z961 Presence of intraocular lens: Secondary | ICD-10-CM | POA: Diagnosis not present

## 2020-05-12 DIAGNOSIS — H0102B Squamous blepharitis left eye, upper and lower eyelids: Secondary | ICD-10-CM | POA: Diagnosis not present

## 2020-05-12 DIAGNOSIS — H18413 Arcus senilis, bilateral: Secondary | ICD-10-CM | POA: Diagnosis not present

## 2020-05-12 DIAGNOSIS — H40022 Open angle with borderline findings, high risk, left eye: Secondary | ICD-10-CM | POA: Diagnosis not present

## 2020-05-12 DIAGNOSIS — G510 Bell's palsy: Secondary | ICD-10-CM | POA: Diagnosis not present

## 2020-05-12 DIAGNOSIS — H401111 Primary open-angle glaucoma, right eye, mild stage: Secondary | ICD-10-CM | POA: Diagnosis not present

## 2020-05-12 DIAGNOSIS — H0102A Squamous blepharitis right eye, upper and lower eyelids: Secondary | ICD-10-CM | POA: Diagnosis not present

## 2020-05-12 DIAGNOSIS — H11153 Pinguecula, bilateral: Secondary | ICD-10-CM | POA: Diagnosis not present

## 2020-05-12 DIAGNOSIS — H16223 Keratoconjunctivitis sicca, not specified as Sjogren's, bilateral: Secondary | ICD-10-CM | POA: Diagnosis not present

## 2020-06-09 ENCOUNTER — Other Ambulatory Visit: Payer: Self-pay

## 2020-06-09 ENCOUNTER — Ambulatory Visit (INDEPENDENT_AMBULATORY_CARE_PROVIDER_SITE_OTHER): Payer: PPO

## 2020-06-09 DIAGNOSIS — D649 Anemia, unspecified: Secondary | ICD-10-CM | POA: Diagnosis not present

## 2020-06-09 DIAGNOSIS — Z79899 Other long term (current) drug therapy: Secondary | ICD-10-CM

## 2020-06-09 DIAGNOSIS — E538 Deficiency of other specified B group vitamins: Secondary | ICD-10-CM | POA: Diagnosis not present

## 2020-06-09 NOTE — Progress Notes (Signed)
Patient presents to the office for a nurse visit to have labs done. Patient states that she is taking the iron with the Vitamin C. Vitals taken and recorded.

## 2020-06-10 LAB — COMPLETE METABOLIC PANEL WITH GFR
AG Ratio: 1.4 (calc) (ref 1.0–2.5)
ALT: 11 U/L (ref 6–29)
AST: 15 U/L (ref 10–35)
Albumin: 4 g/dL (ref 3.6–5.1)
Alkaline phosphatase (APISO): 70 U/L (ref 37–153)
BUN: 8 mg/dL (ref 7–25)
CO2: 28 mmol/L (ref 20–32)
Calcium: 9.2 mg/dL (ref 8.6–10.4)
Chloride: 106 mmol/L (ref 98–110)
Creat: 0.75 mg/dL (ref 0.60–0.93)
GFR, Est African American: 94 mL/min/{1.73_m2} (ref >=60)
GFR, Est Non African American: 81 mL/min/{1.73_m2} (ref >=60)
Globulin: 2.8 g/dL (calc) (ref 1.9–3.7)
Glucose, Bld: 121 mg/dL — ABNORMAL HIGH (ref 65–99)
Potassium: 4.6 mmol/L (ref 3.5–5.3)
Sodium: 142 mmol/L (ref 135–146)
Total Bilirubin: 0.5 mg/dL (ref 0.2–1.2)
Total Protein: 6.8 g/dL (ref 6.1–8.1)

## 2020-06-10 LAB — CBC WITH DIFFERENTIAL/PLATELET
Absolute Monocytes: 704 cells/uL (ref 200–950)
Basophils Absolute: 69 cells/uL (ref 0–200)
Basophils Relative: 1 %
Eosinophils Absolute: 159 cells/uL (ref 15–500)
Eosinophils Relative: 2.3 %
HCT: 34.7 % — ABNORMAL LOW (ref 35.0–45.0)
Hemoglobin: 10.8 g/dL — ABNORMAL LOW (ref 11.7–15.5)
Lymphs Abs: 2256 cells/uL (ref 850–3900)
MCH: 26.3 pg — ABNORMAL LOW (ref 27.0–33.0)
MCHC: 31.1 g/dL — ABNORMAL LOW (ref 32.0–36.0)
MCV: 84.6 fL (ref 80.0–100.0)
MPV: 9.7 fL (ref 7.5–12.5)
Monocytes Relative: 10.2 %
Neutro Abs: 3712 cells/uL (ref 1500–7800)
Neutrophils Relative %: 53.8 %
Platelets: 293 10*3/uL (ref 140–400)
RBC: 4.1 10*6/uL (ref 3.80–5.10)
RDW: 12.8 % (ref 11.0–15.0)
Total Lymphocyte: 32.7 %
WBC: 6.9 10*3/uL (ref 3.8–10.8)

## 2020-06-10 LAB — VITAMIN B12: Vitamin B-12: 474 pg/mL (ref 200–1100)

## 2020-06-10 LAB — RETICULOCYTES
ABS Retic: 57400 cells/uL (ref 20000–8000)
Retic Ct Pct: 1.4 %

## 2020-06-10 LAB — FERRITIN: Ferritin: 4 ng/mL — ABNORMAL LOW (ref 16–288)

## 2020-06-10 LAB — IRON, TOTAL/TOTAL IRON BINDING CAP
%SAT: 15 % (calc) — ABNORMAL LOW (ref 16–45)
Iron: 61 ug/dL (ref 45–160)
TIBC: 400 mcg/dL (calc) (ref 250–450)

## 2020-06-17 NOTE — Addendum Note (Signed)
Addended by: Vicie Mutters R on: 06/17/2020 12:50 PM   Modules accepted: Orders

## 2020-06-24 ENCOUNTER — Other Ambulatory Visit: Payer: Self-pay | Admitting: Internal Medicine

## 2020-06-24 MED ORDER — POTASSIUM CHLORIDE CRYS ER 20 MEQ PO TBCR: EXTENDED_RELEASE_TABLET | ORAL | 0 refills | Status: DC

## 2020-06-24 MED ORDER — ATORVASTATIN CALCIUM 40 MG PO TABS: ORAL_TABLET | ORAL | 0 refills | Status: DC

## 2020-06-30 DIAGNOSIS — H16223 Keratoconjunctivitis sicca, not specified as Sjogren's, bilateral: Secondary | ICD-10-CM | POA: Diagnosis not present

## 2020-06-30 DIAGNOSIS — H18413 Arcus senilis, bilateral: Secondary | ICD-10-CM | POA: Diagnosis not present

## 2020-06-30 DIAGNOSIS — H11153 Pinguecula, bilateral: Secondary | ICD-10-CM | POA: Diagnosis not present

## 2020-06-30 DIAGNOSIS — H0102A Squamous blepharitis right eye, upper and lower eyelids: Secondary | ICD-10-CM | POA: Diagnosis not present

## 2020-06-30 DIAGNOSIS — H0102B Squamous blepharitis left eye, upper and lower eyelids: Secondary | ICD-10-CM | POA: Diagnosis not present

## 2020-06-30 DIAGNOSIS — G510 Bell's palsy: Secondary | ICD-10-CM | POA: Diagnosis not present

## 2020-06-30 DIAGNOSIS — H40022 Open angle with borderline findings, high risk, left eye: Secondary | ICD-10-CM | POA: Diagnosis not present

## 2020-06-30 DIAGNOSIS — Z961 Presence of intraocular lens: Secondary | ICD-10-CM | POA: Diagnosis not present

## 2020-06-30 DIAGNOSIS — H401111 Primary open-angle glaucoma, right eye, mild stage: Secondary | ICD-10-CM | POA: Diagnosis not present

## 2020-07-03 ENCOUNTER — Other Ambulatory Visit: Payer: Self-pay | Admitting: *Deleted

## 2020-07-03 DIAGNOSIS — Z1212 Encounter for screening for malignant neoplasm of rectum: Secondary | ICD-10-CM | POA: Diagnosis not present

## 2020-07-03 DIAGNOSIS — Z1211 Encounter for screening for malignant neoplasm of colon: Secondary | ICD-10-CM

## 2020-07-03 LAB — POC HEMOCCULT BLD/STL (HOME/3-CARD/SCREEN)
Card #2 Fecal Occult Blod, POC: NEGATIVE
Card #3 Fecal Occult Blood, POC: NEGATIVE
Fecal Occult Blood, POC: NEGATIVE

## 2020-07-23 ENCOUNTER — Encounter: Payer: PPO | Admitting: Internal Medicine

## 2020-08-12 ENCOUNTER — Encounter: Payer: PPO | Admitting: Internal Medicine

## 2020-08-26 ENCOUNTER — Other Ambulatory Visit: Payer: Self-pay | Admitting: Adult Health

## 2020-08-26 DIAGNOSIS — I10 Essential (primary) hypertension: Secondary | ICD-10-CM

## 2020-09-02 ENCOUNTER — Ambulatory Visit: Payer: PPO | Attending: Internal Medicine

## 2020-09-02 DIAGNOSIS — Z23 Encounter for immunization: Secondary | ICD-10-CM

## 2020-09-02 NOTE — Progress Notes (Signed)
° °  Covid-19 Vaccination Clinic  Name:  Lydia Edwards    MRN: [id number] DOB: 10/19/1951  09/02/2020  [name] was observed post Covid-19 immunization for 15 minutes without incident. She was provided with Vaccine Information Sheet and instruction to access the V-Safe system.   [name] was instructed to call 911 with any severe reactions post vaccine:  Difficulty breathing   Swelling of face and throat   A fast heartbeat   A bad rash all over body   Dizziness and weakness

## 2020-09-22 ENCOUNTER — Other Ambulatory Visit: Payer: Self-pay | Admitting: Internal Medicine

## 2020-11-03 DIAGNOSIS — H16223 Keratoconjunctivitis sicca, not specified as Sjogren's, bilateral: Secondary | ICD-10-CM | POA: Diagnosis not present

## 2020-11-03 DIAGNOSIS — H401131 Primary open-angle glaucoma, bilateral, mild stage: Secondary | ICD-10-CM | POA: Diagnosis not present

## 2020-11-03 DIAGNOSIS — H0102A Squamous blepharitis right eye, upper and lower eyelids: Secondary | ICD-10-CM | POA: Diagnosis not present

## 2020-11-03 DIAGNOSIS — H0102B Squamous blepharitis left eye, upper and lower eyelids: Secondary | ICD-10-CM | POA: Diagnosis not present

## 2020-11-05 ENCOUNTER — Ambulatory Visit (INDEPENDENT_AMBULATORY_CARE_PROVIDER_SITE_OTHER): Payer: PPO

## 2020-11-05 ENCOUNTER — Other Ambulatory Visit: Payer: Self-pay

## 2020-11-05 VITALS — Temp 97.7°F

## 2020-11-05 DIAGNOSIS — Z23 Encounter for immunization: Secondary | ICD-10-CM

## 2021-01-11 ENCOUNTER — Encounter: Payer: Self-pay | Admitting: Internal Medicine

## 2021-01-11 NOTE — Patient Instructions (Signed)

## 2021-01-11 NOTE — Progress Notes (Signed)
Annual Screening/Preventative Visit & Comprehensive Evaluation &  Examination      This very nice 70 y.o.  [name] presents for a Screening /Preventative Visit & comprehensive evaluation and management of multiple medical co-morbidities.  Patient has been followed for HTN, HLD, T2_NIDDM  Prediabetes  and Vitamin D Deficiency.       HTN predates since 2002. Patient's BP has been controlled at home and patient denies any cardiac symptoms as chest pain,  shortness of breath, dizziness or ankle swelling, but doesreport occasional palpitations. Today's BP is at goal - 136/80 . Patient was hospitalized in 2011 with ARF due to Dehydration & NSAID's and again with ARF in 2016 with CAP and Dehydration with AKI requiring dialysis for recovery to her baseline CKD2a and now GFR improved up to 94 - Normal.  EKG today showed SB 60 /min with 1st deg AVB (256 ms) and UF VPB's.       Patient's hyperlipidemia is controlled with diet and mAtorvastatin. Patient denies myalgias or other medication SE's. Last lipids were at goal:  Lab Results  Component Value Date   CHOL 119 05/07/2020   HDL 45 (L) 05/07/2020   LDLCALC 62 05/07/2020   TRIG 42 05/07/2020   CHOLHDL 2.6 05/07/2020        Patient has hx/o prediabetes (A1c 6.2% /2012) and patient denies reactive hypoglycemic symptoms, visual blurring, diabetic polys or paresthesias. Last A1c was not at goal:  Lab Results  Component Value Date   HGBA1C 5.8 (H) 05/07/2020        Finally, patient has history of Vitamin D Deficiency ("12" /2012) and last Vitamin D was at goal:   Lab Results  Component Value Date   VD25OH 60 01/28/2020    Current Outpatient Medications on File Prior to Visit  Medication Sig   acetaminophen  500 MG tablet Take every 6  hrs as needed for  pain   aspirin EC 81 MG tablet Take daily   atorvastatin  40 MG tablet Take 1 tablet Daily    VITAMIN D  5,000 Units  Take daily.   cyclobenzaprine  10 MG tablet Take 1 tablet  3 x  daily as needed    losartan-hctz 100-25 MG tablet Take 1 tablet Daily    Magnesium 400 MG TABS Take 1 tablet by mouth daily   KLOR-CON 20 MEQ tablet Take 1 tablet daily for Potassium     Allergies  Allergen Reactions   Ace Inhibitors    Naproxen     REACTION: renal failure   Norvasc [Amlodipine Besylate]     edema   Nsaids     Past Medical History:  Diagnosis Date   CAP (community acquired pneumonia) 11/12/2015   Hyperlipidemia    Hypertension    Prediabetes    Vitamin D deficiency    Health Maintenance  Topic Date Due   TETANUS/TDAP  02/18/2010   MAMMOGRAM  02/17/2022   COLONOSCOPY (Pts 45-61yrs Insurance coverage will need to be confirmed)  09/09/2025   INFLUENZA VACCINE  Completed   DEXA SCAN  Completed   COVID-19 Vaccine  Completed   Hepatitis C Screening  Completed   PNA vac Low Risk Adult  Completed   Immunization History  Administered Date(s) Administered   Influenza, High Dose Seasonal PF 08/28/2018, 10/22/2019, 11/05/2020   Influenza,inj,quad, With Preservative 10/25/2016   Influenza,trivalent, recombinat, inj, PF 09/07/2015   Influenza-Unspecified 08/30/2015   PFIZER(Purple Top)SARS-COV-2 Vaccination 12/24/2019, 01/14/2020, 09/02/2020   Pneumococcal Conjugate-13 01/30/2018   Pneumococcal  Polysaccharide-23 10/29/2015   Td 02/19/2000    Last Colon - 09/10/2015 - Dr Havery Moros - hyperplastic polyp - 87 yr f/u  Due Oct 2026  Last MGM - 02/18/2020  Last dexaBMD -06/14/2016 - Normal - No Osteoporosis  Past Surgical History:  Procedure Laterality Date   ABDOMINAL HYSTERECTOMY     EYE SURGERY Left 2017   Cataract sugery left eye   Family History  Problem Relation Age of Onset   Stroke Sister    Hypertension Sister    Cancer Sister    Heart attack Brother    Kidney disease Brother    Hypertension Sister    Diabetes Sister    Kidney disease Sister    Colon cancer Neg Hx    Breast cancer Neg Hx    Social  History   Tobacco Use   Smoking status: Never Smoker   Smokeless tobacco: Never Used  Substance Use Topics   Alcohol use: No    Alcohol/week: 0.0 standard drinks   Drug use: No    ROS Constitutional: Denies fever, chills, weight loss/gain, headaches, insomnia,  night sweats, and change in appetite. Does c/o fatigue. Eyes: Denies redness, blurred vision, diplopia, discharge, itchy, watery eyes.  ENT: Denies discharge, congestion, post nasal drip, epistaxis, sore throat, earache, hearing loss, dental pain, Tinnitus, Vertigo, Sinus pain, snoring.  Cardio: Denies chest pain, palpitations, irregular heartbeat, syncope, dyspnea, diaphoresis, orthopnea, PND, claudication, edema Respiratory: denies cough, dyspnea, DOE, pleurisy, hoarseness, laryngitis, wheezing.  Gastrointestinal: Denies dysphagia, heartburn, reflux, water brash, pain, cramps, nausea, vomiting, bloating, diarrhea, constipation, hematemesis, melena, hematochezia, jaundice, hemorrhoids Genitourinary: Denies dysuria, frequency, urgency, nocturia, hesitancy, discharge, hematuria, flank pain Breast: Breast lumps, nipple discharge, bleeding.  Musculoskeletal: Denies arthralgia, myalgia, stiffness, Jt. Swelling, pain, limp, and strain/sprain. Denies falls. Skin: Denies puritis, rash, hives, warts, acne, eczema, changing in skin lesion Neuro: No weakness, tremor, incoordination, spasms, paresthesia, pain Psychiatric: Denies confusion, memory loss, sensory loss. Denies Depression. Endocrine: Denies change in weight, skin, hair change, nocturia, and paresthesia, diabetic polys, visual blurring, hyper / hypo glycemic episodes.  Heme/Lymph: No excessive bleeding, bruising, enlarged lymph nodes.  Physical Exam  BP 136/80    Pulse (!) 110    Temp 97.6 F (36.4 C)    Resp 16    Ht 5\' 3"  (1.6 m)    Wt 194 lb 6.4 oz (88.2 kg)    SpO2 99%    BMI 34.44 kg/m   General Appearance: Well nourished, well groomed and in no apparent  distress.  Eyes: PERRLA, EOMs, conjunctiva no swelling or erythema, normal fundi and vessels. Sinuses: No frontal/maxillary tenderness ENT/Mouth: EACs patent / TMs  nl. Nares clear without erythema, swelling, mucoid exudates. Oral hygiene is good. No erythema, swelling, or exudate. Tongue normal, non-obstructing. Tonsils not swollen or erythematous. Hearing normal.  Neck: Supple, thyroid not palpable. No bruits, nodes or JVD. Respiratory: Respiratory effort normal.  BS equal and clear bilateral without rales, rhonci, wheezing or stridor. Cardio: Heart sounds are normal with regular rate and rhythm and no murmurs, rubs or gallops. Peripheral pulses are normal and equal bilaterally without edema. No aortic or femoral bruits. Chest: symmetric with normal excursions and percussion. Breasts: Symmetric, without lumps, nipple discharge, retractions, or fibrocystic changes.  Abdomen: Flat, soft with bowel sounds active. Nontender, no guarding, rebound, hernias, masses, or organomegaly.  Lymphatics: Non tender without lymphadenopathy.  Genitourinary:  Musculoskeletal: Full ROM all peripheral extremities, joint stability, 5/5 strength, and normal gait. Skin: Warm and dry without rashes, lesions, cyanosis,  clubbing or  ecchymosis.  Neuro: Cranial nerves intact, reflexes equal bilaterally. Normal muscle tone, no cerebellar symptoms. Sensation intact.  Pysch: Alert and oriented X 3, normal affect, Insight and Judgment appropriate.   Assessment and Plan  1. Annual Preventative Screening Examination   2. Essential hypertension  Decrease Hyzaar dose to 1/2 tab qam and  add new Rx Zebeta/Bisoprolol 5 mg qpm.  - EKG 12-Lead - Urinalysis, Routine w reflex microscopic - Microalbumin / creatinine urine ratio - CBC with Differential/Platelet - COMPLETE METABOLIC PANEL WITH GFR - Magnesium - TSH  3. Hyperlipidemia, mixed  - Lipid panel - TSH  4. Abnormal glucose  - EKG 12-Lead - Hemoglobin  A1c - Insulin, random  5. Vitamin D deficiency  - VITAMIN D 25 Hydroxy  6. Screening for colorectal cancer  - POC Hemoccult Bld/Stl  7. Screening for ischemic heart disease  - EKG 12-Lead  8. FHx: heart disease  - EKG 12-Lead  9. Medication management  - Urinalysis, Routine w reflex microscopic - Microalbumin / creatinine urine ratio - CBC with Differential/Platelet - COMPLETE METABOLIC PANEL WITH GFR - Magnesium - Lipid panel - TSH - Hemoglobin A1c - Insulin, random - VITAMIN D 25 Hydroxy        Patient was counseled in prudent diet to achieve/maintain BMI less than 25 for weight control, BP monitoring, regular exercise and medications. Discussed med's effects and SE's. Screening labs and tests as requested with regular follow-up as recommended. Over 40 minutes of exam, counseling, chart review and high complex critical decision making was performed.   Kirtland Bouchard, MD

## 2021-01-12 ENCOUNTER — Other Ambulatory Visit: Payer: Self-pay

## 2021-01-12 ENCOUNTER — Ambulatory Visit (INDEPENDENT_AMBULATORY_CARE_PROVIDER_SITE_OTHER): Payer: PPO | Admitting: Internal Medicine

## 2021-01-12 VITALS — BP 136/80 | HR 110 | Temp 97.6°F | Resp 16 | Ht 63.0 in | Wt 194.4 lb

## 2021-01-12 DIAGNOSIS — Z0001 Encounter for general adult medical examination with abnormal findings: Secondary | ICD-10-CM

## 2021-01-12 DIAGNOSIS — I10 Essential (primary) hypertension: Secondary | ICD-10-CM

## 2021-01-12 DIAGNOSIS — Z1211 Encounter for screening for malignant neoplasm of colon: Secondary | ICD-10-CM

## 2021-01-12 DIAGNOSIS — Z136 Encounter for screening for cardiovascular disorders: Secondary | ICD-10-CM

## 2021-01-12 DIAGNOSIS — Z79899 Other long term (current) drug therapy: Secondary | ICD-10-CM

## 2021-01-12 DIAGNOSIS — E782 Mixed hyperlipidemia: Secondary | ICD-10-CM | POA: Diagnosis not present

## 2021-01-12 DIAGNOSIS — Z8249 Family history of ischemic heart disease and other diseases of the circulatory system: Secondary | ICD-10-CM | POA: Diagnosis not present

## 2021-01-12 DIAGNOSIS — Z0000 Encounter for general adult medical examination without abnormal findings: Secondary | ICD-10-CM

## 2021-01-12 DIAGNOSIS — R7309 Other abnormal glucose: Secondary | ICD-10-CM

## 2021-01-12 DIAGNOSIS — E559 Vitamin D deficiency, unspecified: Secondary | ICD-10-CM

## 2021-01-12 MED ORDER — BISOPROLOL FUMARATE 5 MG PO TABS: ORAL_TABLET | ORAL | 1 refills | Status: DC

## 2021-01-13 LAB — LIPID PANEL
Cholesterol: 129 mg/dL (ref <200)
HDL: 45 mg/dL — ABNORMAL LOW (ref >=50)
LDL Cholesterol (Calc): 70 mg/dL (calc)
Non-HDL Cholesterol (Calc): 84 mg/dL (calc) (ref <130)
Total CHOL/HDL Ratio: 2.9 (calc) (ref <5.0)
Triglycerides: 68 mg/dL (ref <150)

## 2021-01-13 LAB — MAGNESIUM: Magnesium: 2 mg/dL (ref 1.5–2.5)

## 2021-01-13 LAB — COMPLETE METABOLIC PANEL WITH GFR
AG Ratio: 1.5 (calc) (ref 1.0–2.5)
ALT: 11 U/L (ref 6–29)
AST: 15 U/L (ref 10–35)
Albumin: 4.3 g/dL (ref 3.6–5.1)
Alkaline phosphatase (APISO): 61 U/L (ref 37–153)
BUN/Creatinine Ratio: 13 (calc) (ref 6–22)
BUN: 12 mg/dL (ref 7–25)
CO2: 29 mmol/L (ref 20–32)
Calcium: 9.4 mg/dL (ref 8.6–10.4)
Chloride: 105 mmol/L (ref 98–110)
Creat: 0.94 mg/dL — ABNORMAL HIGH (ref 0.60–0.93)
GFR, Est African American: 71 mL/min/{1.73_m2} (ref >=60)
GFR, Est Non African American: 61 mL/min/{1.73_m2} (ref >=60)
Globulin: 2.9 g/dL (calc) (ref 1.9–3.7)
Glucose, Bld: 113 mg/dL — ABNORMAL HIGH (ref 65–99)
Potassium: 3.9 mmol/L (ref 3.5–5.3)
Sodium: 144 mmol/L (ref 135–146)
Total Bilirubin: 0.7 mg/dL (ref 0.2–1.2)
Total Protein: 7.2 g/dL (ref 6.1–8.1)

## 2021-01-13 LAB — CBC WITH DIFFERENTIAL/PLATELET
Absolute Monocytes: 735 cells/uL (ref 200–950)
Basophils Absolute: 49 cells/uL (ref 0–200)
Basophils Relative: 0.7 %
Eosinophils Absolute: 168 cells/uL (ref 15–500)
Eosinophils Relative: 2.4 %
HCT: 39.6 % (ref 35.0–45.0)
Hemoglobin: 12.6 g/dL (ref 11.7–15.5)
Lymphs Abs: 2639 cells/uL (ref 850–3900)
MCH: 26.7 pg — ABNORMAL LOW (ref 27.0–33.0)
MCHC: 31.8 g/dL — ABNORMAL LOW (ref 32.0–36.0)
MCV: 83.9 fL (ref 80.0–100.0)
MPV: 9.5 fL (ref 7.5–12.5)
Monocytes Relative: 10.5 %
Neutro Abs: 3409 cells/uL (ref 1500–7800)
Neutrophils Relative %: 48.7 %
Platelets: 287 10*3/uL (ref 140–400)
RBC: 4.72 10*6/uL (ref 3.80–5.10)
RDW: 13.4 % (ref 11.0–15.0)
Total Lymphocyte: 37.7 %
WBC: 7 10*3/uL (ref 3.8–10.8)

## 2021-01-13 LAB — MICROALBUMIN / CREATININE URINE RATIO
Creatinine, Urine: 81 mg/dL (ref 20–275)
Microalb Creat Ratio: 2 mcg/mg creat (ref <30)
Microalb, Ur: 0.2 mg/dL

## 2021-01-13 LAB — URINALYSIS, ROUTINE W REFLEX MICROSCOPIC
Bilirubin Urine: NEGATIVE
Glucose, UA: NEGATIVE
Hgb urine dipstick: NEGATIVE
Hyaline Cast: NONE SEEN /LPF
Ketones, ur: NEGATIVE
Nitrite: NEGATIVE
Protein, ur: NEGATIVE
Specific Gravity, Urine: 1.01 (ref 1.001–1.03)
pH: 7 (ref 5.0–8.0)

## 2021-01-13 LAB — VITAMIN D 25 HYDROXY (VIT D DEFICIENCY, FRACTURES): Vit D, 25-Hydroxy: 76 ng/mL (ref 30–100)

## 2021-01-13 LAB — HEMOGLOBIN A1C
Hgb A1c MFr Bld: 6.3 % of total Hgb — ABNORMAL HIGH (ref <5.7)
Mean Plasma Glucose: 134 mg/dL
eAG (mmol/L): 7.4 mmol/L

## 2021-01-13 LAB — INSULIN, RANDOM: Insulin: 14.4 u[IU]/mL

## 2021-01-13 LAB — TSH: TSH: 2.02 mIU/L (ref 0.40–4.50)

## 2021-01-13 NOTE — Progress Notes (Signed)
=========================================================== ===========================================================  -   Total Chol = 129 -  Excellent   - Very low risk for Heart Attack  / Stroke =========================================================== ===========================================================  - A1c - worse - has gone up from 5.8% to now 6.3%  &                                           getting close to  needing to start meds for Diabetes   - Being diabetic has a  300% increased risk for Heart Attack,                                 Stroke, Cancer and Alzheimer- type Vascular Dementia.   It is very important that you work harder with diet by                                              avoiding all foods that are white                                                                         except chicken, fish & calliflower.  - Avoid white rice  (brown & wild rice is OK),   - Avoid white potatoes  (sweet potatoes in moderation is OK),   White bread or wheat bread or anything made out of   white flour like bagels, donuts, rolls, buns, biscuits, cakes,  - pastries, cookies, pizza crust, and pasta (made from                                                                            white flour & egg whites)   - vegetarian pasta or spinach or wheat pasta is OK.  - Multigrain breads like Arnold's, Pepperidge Farm or   multigrain sandwich thins or high fiber breads like   Eureka bread or "Dave's Killer" breads that are  4 to 5 grams fiber per slice !  are best.    Diet, exercise and weight loss can reverse and cure  diabetes in the early stages.   ===========================================================  - Vitamin D = 76 - Excellent  ===========================================================  - All Else - CBC - Kidneys - Electrolytes - Liver - Magnesium & Thyroid    - all  Normal / OK ===========================================================   ===========================================================

## 2021-01-27 ENCOUNTER — Other Ambulatory Visit: Payer: Self-pay | Admitting: Internal Medicine

## 2021-01-27 ENCOUNTER — Other Ambulatory Visit: Payer: Self-pay

## 2021-01-27 DIAGNOSIS — Z1211 Encounter for screening for malignant neoplasm of colon: Secondary | ICD-10-CM

## 2021-01-27 DIAGNOSIS — Z1212 Encounter for screening for malignant neoplasm of rectum: Secondary | ICD-10-CM

## 2021-01-27 DIAGNOSIS — Z0000 Encounter for general adult medical examination without abnormal findings: Secondary | ICD-10-CM

## 2021-01-27 LAB — POC HEMOCCULT BLD/STL (HOME/3-CARD/SCREEN)
Card #2 Fecal Occult Blod, POC: NEGATIVE
Card #3 Fecal Occult Blood, POC: NEGATIVE
Fecal Occult Blood, POC: NEGATIVE

## 2021-01-28 DIAGNOSIS — Z1211 Encounter for screening for malignant neoplasm of colon: Secondary | ICD-10-CM | POA: Diagnosis not present

## 2021-01-28 DIAGNOSIS — Z1212 Encounter for screening for malignant neoplasm of rectum: Secondary | ICD-10-CM | POA: Diagnosis not present

## 2021-02-02 DIAGNOSIS — H0102B Squamous blepharitis left eye, upper and lower eyelids: Secondary | ICD-10-CM | POA: Diagnosis not present

## 2021-02-02 DIAGNOSIS — Z961 Presence of intraocular lens: Secondary | ICD-10-CM | POA: Diagnosis not present

## 2021-02-02 DIAGNOSIS — H0102A Squamous blepharitis right eye, upper and lower eyelids: Secondary | ICD-10-CM | POA: Diagnosis not present

## 2021-02-02 DIAGNOSIS — H401131 Primary open-angle glaucoma, bilateral, mild stage: Secondary | ICD-10-CM | POA: Diagnosis not present

## 2021-02-02 DIAGNOSIS — G510 Bell's palsy: Secondary | ICD-10-CM | POA: Diagnosis not present

## 2021-02-02 DIAGNOSIS — H16223 Keratoconjunctivitis sicca, not specified as Sjogren's, bilateral: Secondary | ICD-10-CM | POA: Diagnosis not present

## 2021-03-20 ENCOUNTER — Ambulatory Visit
Admission: RE | Admit: 2021-03-20 | Discharge: 2021-03-20 | Disposition: A | Discharge status: Home / Self Care | Payer: PPO | Source: Ambulatory Visit | Attending: Internal Medicine | Admitting: Internal Medicine

## 2021-03-20 ENCOUNTER — Other Ambulatory Visit: Payer: Self-pay

## 2021-03-20 DIAGNOSIS — Z1231 Encounter for screening mammogram for malignant neoplasm of breast: Secondary | ICD-10-CM | POA: Diagnosis not present

## 2021-03-20 DIAGNOSIS — Z0000 Encounter for general adult medical examination without abnormal findings: Secondary | ICD-10-CM

## 2021-03-21 ENCOUNTER — Other Ambulatory Visit: Payer: Self-pay | Admitting: Internal Medicine

## 2021-03-21 DIAGNOSIS — E782 Mixed hyperlipidemia: Secondary | ICD-10-CM

## 2021-03-21 MED ORDER — ATORVASTATIN CALCIUM 40 MG PO TABS: ORAL_TABLET | ORAL | 3 refills | Status: DC

## 2021-04-10 ENCOUNTER — Encounter: Payer: Self-pay | Admitting: Adult Health

## 2021-04-10 NOTE — Progress Notes (Signed)
ANNUAL MEDICARE VISIT AND 3 MONTH FOLLOW UP  Assessment:    Encounter for Medicare annual wellness exam 1 year Given information about advanced directives for her and her husband.  Essential hypertension - continue medications, DASH diet, exercise and monitor at home. Call if greater than 130/80.  -     CBC with Differential/Platelet -     CMP/GFR -     TSH  Mixed hyperlipidemia -continue medications, check lipids, decrease fatty foods, increase activity.  -     Lipid panel  Chronic kidney disease (CKD) stage 2/ history of nephrotic syndrome Increase fluids, avoid NSAIDS, monitor sugars, will monitor -     CMP/GFR  Medication management CBC, CMP/GFR, magnesium   Vitamin D deficiency Continue supplement  Other abnormal glucose (prediabetes) Discussed disease and risks Discussed diet/exercise, weight management  Check A1C q70m; CMP for glucose otherwise  Obesity with co morbidities Long discussion about weight loss, diet, and exercise Discussed ideal weight for height and initial weight goal (<180lb) Patient will work on cutting out sweet tea, processed carbohydrate Will follow up in 3 months  Lumbar back pain, left - negative straight leg, no injury, suggestive of muscle strain Acetaminophen, topical NSAID, muscle relaxer RICE, and exercise given If not better follow up in office or will refer to PT/orthopedics.   Over 30 minutes of exam, counseling, chart review, and critical decision making was performed Future Appointments  Date Time Provider [place]  07/27/2021 10:30 AM Unk Pinto, MD [facility] None  01/20/2022 10:00 AM Unk Pinto, MD [facility] None     Plan:   During the course of the visit the patient was educated and counseled about appropriate screening and preventive services including:    Pneumococcal vaccine   Influenza vaccine  Td vaccine  Prevnar 13  Screening electrocardiogram  Screening mammography  Bone  densitometry screening  Colorectal cancer screening  Diabetes screening  Glaucoma screening  Nutrition counseling   Advanced directives: given info/requested copies   Subjective:   Lydia Edwards is a 70 y.o. female who presents for Medicare Annual Wellness Visit and 3 month follow up on hypertension, prediabetes, hyperlipidemia, vitamin D def.   She works for [employer], didn't want to retire, working 2-3 days/week, new position with lots of lifting. No particular event or injury. She reports new intermittent left lower back pain "catching," intermittent, worse in the evening after work, non-radiating, worse with bending over. Taking acetaminophen with benefit, also has cyclobenzaprine but hasn't tried. Hot showers help.   She reports s/p hysterectomy, some bladder prolapse, has seen GYN in the past for pessary discussion but had declined, requests referral back, having more discomfort, has to push up bladder prior to urinating.   BMI is Body mass index is 34.19 kg/m., she is working on diet, no intentional exercise but physically active job. She has reduced sweet tea significantly, pitcher lasts 1.5 week.  Wt Readings from Last 3 Encounters:  04/13/21 193 lb (87.5 kg)  01/12/21 194 lb 6.4 oz (88.2 kg)  06/09/20 189 lb 6.4 oz (85.9 kg)   Her blood pressure has been controlled at home, today their BP is BP: 136/80 She does workout.  She does a lot of walking.  She walks at least a mile daily when at work. She denies chest pain, shortness of breath, dizziness.   She is on cholesterol medication (lipitor 40 mg daily) and denies myalgias. Her cholesterol is at goal. The cholesterol last visit was:   Lab Results  Component Value  Date   CHOL 129 01/12/2021   HDL 45 (L) 01/12/2021   LDLCALC 70 01/12/2021   LDLDIRECT 160.1 07/18/2009   TRIG 68 01/12/2021   CHOLHDL 2.9 01/12/2021   She has been working on diet and exercise for prediabetes, SHE HAS BEEN DRINKING SWEET TEA, has cut back  significantly, and denies foot ulcerations, hyperglycemia, hypoglycemia , increased appetite, nausea, paresthesia of the feet, polydipsia, polyuria, visual disturbances, vomiting and weight loss.  Last A1C in the office was:  Lab Results  Component Value Date   HGBA1C 6.3 (H) 01/12/2021   Patient is on Vitamin D supplement, at goal recent check:  Lab Results  Component Value Date   VD25OH 76 01/12/2021     She has a history of nephrotic syndrome, saw Dr. Lorrene Reid for that, has been discharged and we are still monitoring kidney functions.  Lab Results  Component Value Date   GFRAA 71 01/12/2021     Medication Review   Current Outpatient Medications (Cardiovascular):  .  atorvastatin (LIPITOR) 40 MG tablet, Take  1 tablet  Daily  for Cholesterol .  bisoprolol (ZEBETA) 5 MG tablet, Take  1 tablet  Daily  for BP & Heart skips. Marland Kitchen  losartan-hydrochlorothiazide (HYZAAR) 100-25 MG tablet, Take      1 tablet       Daily       for BP   Current Outpatient Medications (Analgesics):  .  acetaminophen (TYLENOL) 500 MG tablet, Take 500 mg by mouth every 6 (six) hours as needed for mild pain or headache.  Marland Kitchen  aspirin EC 81 MG tablet, Take 81 mg by mouth daily.   Current Outpatient Medications (Other):  Marland Kitchen  Cholecalciferol (VITAMIN D PO), Take 5,000 Units by mouth daily. .  Magnesium 400 MG TABS, Take 1 tablet by mouth daily. .  potassium chloride SA (KLOR-CON) 20 MEQ tablet, Take 1 tablet daily for Potassium .  cyclobenzaprine (FLEXERIL) 10 MG tablet, Take 0.5-1 tablets (5-10 mg total) by mouth 3 (three) times daily as needed for muscle spasms.  Current Problems (verified) Patient Active Problem List   Diagnosis Date Noted  . Obesity (BMI 30.0-34.9) 10/18/2019  . FHx: heart disease 08/28/2018  . Other abnormal glucose (prediabetes) 10/25/2016  . Chronic kidney disease (CKD) stage 2 07/19/2016  . Encounter for Medicare annual wellness exam 10/06/2015  . Medication management 09/02/2014  .  Hyperlipidemia   . Hypertension   . Vitamin D deficiency     Screening Tests Immunization History  Administered Date(s) Administered  . Influenza, High Dose Seasonal PF 08/28/2018, 10/22/2019, 11/05/2020  . Influenza,inj,quad, With Preservative 10/25/2016  . Influenza,trivalent, recombinat, inj, PF 09/07/2015  . Influenza-Unspecified 08/30/2015  . PFIZER(Purple Top)SARS-COV-2 Vaccination 12/24/2019, 01/14/2020, 09/02/2020  . Pneumococcal Conjugate-13 01/30/2018  . Pneumococcal Polysaccharide-23 10/29/2015  . Td 02/19/2000    Preventative care: Last colonoscopy: 08/2015, normal  Last mammogram: 02/2021 Last pap smear/pelvic exam:  S/p hysterectomy, ovaries spared, referring back to GYN today DEXA: 05/2016 normal -0.6  Prior vaccinations: TD or Tdap: will boost PRN Influenza: 2021 Pneumococcal: 2016 Prevnar13: 2019 Shingles/Zostavax: declines COVID vaccine: 3/3, pfizer  Names of Other Physician/Practitioners you currently use: 1. [facility]- here for primary care 2. Dr. Katy Fitch, eye doctor, 2021, has scheduled in June 3. [facility], last visit 2018, DUE  Patient Care Team: Unk Pinto, MD as PCP - General (Internal Medicine) Vania Rea, MD as Consulting Physician (Obstetrics and Gynecology) Lafayette Dragon, MD (Inactive) as Consulting Physician (  Gastroenterology)  Allergies Allergies  Allergen Reactions  . Ace Inhibitors   . Naproxen     REACTION: renal failure  . Norvasc [Amlodipine Besylate]     edema  . Nsaids     SURGICAL HISTORY She  has a past surgical history that includes Abdominal hysterectomy and Eye surgery (Left, 2017). FAMILY HISTORY Her family history includes Cancer in her sister; Diabetes in her sister; Heart attack in her brother; Hypertension in her sister and sister; Kidney disease in her brother and sister; Stroke in her sister.  Heart attack brother 69 SOCIAL HISTORY She  reports  that she has never smoked. She has never used smokeless tobacco. She reports that she does not drink alcohol and does not use drugs.  MEDICARE WELLNESS OBJECTIVES: Physical activity: Current Exercise Habits: The patient has a physically strenuous job, but has no regular exercise apart from work., Exercise limited by: None identified Cardiac risk factors: Cardiac Risk Factors include: advanced age (>31men, >29 women);dyslipidemia;hypertension;obesity (BMI >30kg/m2) Depression/mood screen:   Depression screen [facility] 2/9 04/13/2021  Decreased Interest 0  Down, Depressed, Hopeless 0  PHQ - 2 Score 0    ADLs:  In your present state of health, do you have any difficulty performing the following activities: 04/13/2021 01/11/2021  Hearing? N N  Vision? N N  Difficulty concentrating or making decisions? N N  Walking or climbing stairs? N N  Dressing or bathing? N N  Doing errands, shopping? N N  Some recent data might be hidden     Cognitive Testing  Alert? Yes  Normal Appearance?Yes  Oriented to person? Yes  Place? Yes   Time? Yes  Recall of three objects?  Yes  Can perform simple calculations? Yes  Displays appropriate judgment?Yes  Can read the correct time from a watch face?Yes  EOL planning: Does Patient Have a Medical Advance Directive?: No Would patient like information on creating a medical advance directive?: Yes (MAU/Ambulatory/Procedural Areas - Information given)   Review of Systems  Constitutional: Negative for chills, fever, malaise/fatigue and weight loss.  HENT: Negative for congestion, ear pain and sore throat.   Eyes: Negative.   Respiratory: Negative for cough, shortness of breath and wheezing.   Cardiovascular: Negative for chest pain, palpitations and leg swelling.  Gastrointestinal: Negative for blood in stool, constipation, diarrhea, heartburn and melena.  Genitourinary: Negative for dysuria, frequency, hematuria and urgency.  Musculoskeletal: Positive for back pain  (left lumbar, ). Negative for falls, joint pain and myalgias.  Skin: Negative.   Neurological: Negative for dizziness, sensory change, loss of consciousness and headaches.  Endo/Heme/Allergies: Positive for environmental allergies (mild).  Psychiatric/Behavioral: Negative for depression. The patient is not nervous/anxious and does not have insomnia.     Objective:   Today's Vitals   04/13/21 0920 04/13/21 0929  BP: (!) 146/72 136/80  Pulse: (!) 57   Temp: (!) 97 F (36.1 C)   SpO2: 98%   Weight: 193 lb (87.5 kg)   Height: 5\' 3"  (1.6 m)    Body mass index is 34.19 kg/m.  General appearance: alert, no distress, WD/WN,  female HEENT: normocephalic, sclerae anicteric, TMs pearly, nares patent, no discharge or erythema, pharynx normal Oral cavity: MMM, no lesions Neck: supple, no lymphadenopathy, no thyromegaly, no masses Heart: RRR, normal S1, S2, no murmurs Lungs: CTA bilaterally, no wheezes, rhonchi, or rales Abdomen: +bs, soft, non tender, non distended, no masses, no hepatomegaly, no splenomegaly Musculoskeletal: nontender, no swelling, no obvious deformity. Patient is able to ambulate well.  Gait is not  Antalgic. Straight leg raising with dorsiflexion negative bilaterally for radicular symptoms. Sensory exam in the legs are normal. Knee reflexes are normal Ankle reflexes are normal Strength is normal and symmetric in arms and legs. There is not SI tenderness to palpation.  There is not paraspinal muscle spasm.  There is not midline tenderness.  ROM of spine with  limited in flexion or extension due to pain.   Extremities: no edema, no cyanosis, no clubbing Pulses: 2+ symmetric, upper and lower extremities, normal cap refill Neurological: alert, oriented x 3, CN2-12 intact, strength normal upper extremities and lower extremities, sensation normal throughout, DTRs 2+ throughout, no cerebellar signs, gait normal Psychiatric: normal affect, behavior normal, pleasant    Medicare  Attestation I have personally reviewed: The patient's medical and social history Their use of alcohol, tobacco or illicit drugs Their current medications and supplements The patient's functional ability including ADLs,fall risks, home safety risks, cognitive, and hearing and visual impairment Diet and physical activities Evidence for depression or mood disorders  The patient's weight, height, BMI, and visual acuity have been recorded in the chart.  I have made referrals, counseling, and provided education to the patient based on review of the above and I have provided the patient with a written personalized care plan for preventive services.     Izora Ribas, NP   04/13/2021

## 2021-04-13 ENCOUNTER — Encounter: Payer: Self-pay | Admitting: Adult Health

## 2021-04-13 ENCOUNTER — Ambulatory Visit (INDEPENDENT_AMBULATORY_CARE_PROVIDER_SITE_OTHER): Payer: PPO | Admitting: Adult Health

## 2021-04-13 ENCOUNTER — Other Ambulatory Visit: Payer: Self-pay

## 2021-04-13 VITALS — BP 136/80 | HR 57 | Temp 97.0°F | Ht 63.0 in | Wt 193.0 lb

## 2021-04-13 DIAGNOSIS — R6889 Other general symptoms and signs: Secondary | ICD-10-CM | POA: Diagnosis not present

## 2021-04-13 DIAGNOSIS — Z0001 Encounter for general adult medical examination with abnormal findings: Secondary | ICD-10-CM | POA: Diagnosis not present

## 2021-04-13 DIAGNOSIS — R7309 Other abnormal glucose: Secondary | ICD-10-CM

## 2021-04-13 DIAGNOSIS — E782 Mixed hyperlipidemia: Secondary | ICD-10-CM | POA: Diagnosis not present

## 2021-04-13 DIAGNOSIS — Z79899 Other long term (current) drug therapy: Secondary | ICD-10-CM | POA: Diagnosis not present

## 2021-04-13 DIAGNOSIS — M545 Low back pain, unspecified: Secondary | ICD-10-CM | POA: Diagnosis not present

## 2021-04-13 DIAGNOSIS — E669 Obesity, unspecified: Secondary | ICD-10-CM | POA: Diagnosis not present

## 2021-04-13 DIAGNOSIS — I10 Essential (primary) hypertension: Secondary | ICD-10-CM

## 2021-04-13 DIAGNOSIS — M25511 Pain in right shoulder: Secondary | ICD-10-CM

## 2021-04-13 DIAGNOSIS — Z0000 Encounter for general adult medical examination without abnormal findings: Secondary | ICD-10-CM

## 2021-04-13 DIAGNOSIS — E559 Vitamin D deficiency, unspecified: Secondary | ICD-10-CM | POA: Diagnosis not present

## 2021-04-13 DIAGNOSIS — N182 Chronic kidney disease, stage 2 (mild): Secondary | ICD-10-CM | POA: Diagnosis not present

## 2021-04-13 DIAGNOSIS — N819 Female genital prolapse, unspecified: Secondary | ICD-10-CM | POA: Diagnosis not present

## 2021-04-13 MED ORDER — CYCLOBENZAPRINE HCL 10 MG PO TABS: 5.0000 mg | ORAL_TABLET | Freq: Three times a day (TID) | ORAL | 1 refills | Status: DC | PRN

## 2021-04-13 NOTE — Patient Instructions (Signed)
Lydia Edwards , Thank you for taking time to come for your Medicare Wellness Visit. I appreciate your ongoing commitment to your health goals. Please review the following plan we discussed and let me know if I can assist you in the future.   These are the goals we discussed: Goals    . Blood Pressure < 130/80    . Weight (lb) < 180 lb (81.6 kg)       This is a list of the screening recommended for you and due dates:  Health Maintenance  Topic Date Due  . Tetanus Vaccine  04/13/2022*  . Flu Shot  06/29/2021  . Mammogram  03/21/2023  . Colon Cancer Screening  09/09/2025  . DEXA scan (bone density measurement)  Completed  . COVID-19 Vaccine  Completed  . Hepatitis C Screening: USPSTF Recommendation to screen - Ages 6-79 yo.  Completed  . Pneumonia vaccines  Completed  . HPV Vaccine  Aged Out  *Topic was postponed. The date shown is not the original due date.      Acute Back Pain, Adult Acute back pain is sudden and usually short-lived. It is often caused by an injury to the muscles and tissues in the back. The injury may result from:  A muscle or ligament getting overstretched or torn (strained). Ligaments are tissues that connect bones to each other. Lifting something improperly can cause a back strain.  Wear and tear (degeneration) of the spinal disks. Spinal disks are circular tissue that provide cushioning between the bones of the spine (vertebrae).  Twisting motions, such as while playing sports or doing yard work.  A hit to the back.  Arthritis. You may have a physical exam, lab tests, and imaging tests to find the cause of your pain. Acute back pain usually goes away with rest and home care. Follow these instructions at home: Managing pain, stiffness, and swelling  Treatment may include medicines for pain and inflammation that are taken by mouth or applied to the skin, prescription pain medicine, or muscle relaxants. Take over-the-counter and prescription medicines only as  told by your health care provider.  Your health care provider may recommend applying ice during the first 24-48 hours after your pain starts. To do this: ? Put ice in a plastic bag. ? Place a towel between your skin and the bag. ? Leave the ice on for 20 minutes, 2-3 times a day.  If directed, apply heat to the affected area as often as told by your health care provider. Use the heat source that your health care provider recommends, such as a moist heat pack or a heating pad. ? Place a towel between your skin and the heat source. ? Leave the heat on for 20-30 minutes. ? Remove the heat if your skin turns bright red. This is especially important if you are unable to feel pain, heat, or cold. You have a greater risk of getting burned. Activity  Do not stay in bed. Staying in bed for more than 1-2 days can delay your recovery.  Sit up and stand up straight. Avoid leaning forward when you sit or hunching over when you stand. ? If you work at a desk, sit close to it so you do not need to lean over. Keep your chin tucked in. Keep your neck drawn back, and keep your elbows bent at a 90-degree angle (right angle). ? Sit high and close to the steering wheel when you drive. Add lower back (lumbar) support to your car  seat, if needed.  Take short walks on even surfaces as soon as you are able. Try to increase the length of time you walk each day.  Do not sit, drive, or stand in one place for more than 30 minutes at a time. Sitting or standing for long periods of time can put stress on your back.  Do not drive or use heavy machinery while taking prescription pain medicine.  Use proper lifting techniques. When you bend and lift, use positions that put less stress on your back: ? [place] your knees. ? Keep the load close to your body. ? Avoid twisting.  Exercise regularly as told by your health care provider. Exercising helps your back heal faster and helps prevent back injuries by keeping muscles  strong and flexible.  Work with a physical therapist to make a safe exercise program, as recommended by your health care provider. Do any exercises as told by your physical therapist.   Lifestyle  Maintain a healthy weight. Extra weight puts stress on your back and makes it difficult to have good posture.  Avoid activities or situations that make you feel anxious or stressed. Stress and anxiety increase muscle tension and can make back pain worse. Learn ways to manage anxiety and stress, such as through exercise. General instructions  Sleep on a firm mattress in a comfortable position. Try lying on your side with your knees slightly bent. If you lie on your back, put a pillow under your knees.  Follow your treatment plan as told by your health care provider. This may include: ? Cognitive or behavioral therapy. ? Acupuncture or massage therapy. ? Meditation or yoga. Contact a health care provider if:  You have pain that is not relieved with rest or medicine.  You have increasing pain going down into your legs or buttocks.  Your pain does not improve after 2 weeks.  You have pain at night.  You lose weight without trying.  You have a fever or chills. Get help right away if:  You develop new bowel or bladder control problems.  You have unusual weakness or numbness in your arms or legs.  You develop nausea or vomiting.  You develop abdominal pain.  You feel faint. Summary  Acute back pain is sudden and usually short-lived.  Use proper lifting techniques. When you bend and lift, use positions that put less stress on your back.  Take over-the-counter and prescription medicines and apply heat or ice as directed by your health care provider. This information is not intended to replace advice given to you by your health care provider. Make sure you discuss any questions you have with your health care provider. Document Revised: 08/08/2020 Document Reviewed: 08/08/2020 Elsevier  Patient Education  2021 [employer].

## 2021-04-14 LAB — COMPLETE METABOLIC PANEL WITH GFR
AG Ratio: 1.3 (calc) (ref 1.0–2.5)
ALT: 14 U/L (ref 6–29)
AST: 17 U/L (ref 10–35)
Albumin: 4.2 g/dL (ref 3.6–5.1)
Alkaline phosphatase (APISO): 66 U/L (ref 37–153)
BUN: 16 mg/dL (ref 7–25)
CO2: 30 mmol/L (ref 20–32)
Calcium: 9.4 mg/dL (ref 8.6–10.4)
Chloride: 103 mmol/L (ref 98–110)
Creat: 0.84 mg/dL (ref 0.60–0.93)
GFR, Est African American: 82 mL/min/{1.73_m2} (ref >=60)
GFR, Est Non African American: 70 mL/min/{1.73_m2} (ref >=60)
Globulin: 3.3 g/dL (calc) (ref 1.9–3.7)
Glucose, Bld: 112 mg/dL — ABNORMAL HIGH (ref 65–99)
Potassium: 3.6 mmol/L (ref 3.5–5.3)
Sodium: 141 mmol/L (ref 135–146)
Total Bilirubin: 0.5 mg/dL (ref 0.2–1.2)
Total Protein: 7.5 g/dL (ref 6.1–8.1)

## 2021-04-14 LAB — LIPID PANEL
Cholesterol: 122 mg/dL (ref <200)
HDL: 41 mg/dL — ABNORMAL LOW (ref >=50)
LDL Cholesterol (Calc): 68 mg/dL (calc)
Non-HDL Cholesterol (Calc): 81 mg/dL (calc) (ref <130)
Total CHOL/HDL Ratio: 3 (calc) (ref <5.0)
Triglycerides: 57 mg/dL (ref <150)

## 2021-04-14 LAB — TSH: TSH: 1.63 mIU/L (ref 0.40–4.50)

## 2021-04-14 LAB — MAGNESIUM: Magnesium: 1.9 mg/dL (ref 1.5–2.5)

## 2021-04-14 LAB — CBC WITH DIFFERENTIAL/PLATELET
Absolute Monocytes: 632 cells/uL (ref 200–950)
Basophils Absolute: 57 cells/uL (ref 0–200)
Basophils Relative: 0.8 %
Eosinophils Absolute: 121 cells/uL (ref 15–500)
Eosinophils Relative: 1.7 %
HCT: 39.3 % (ref 35.0–45.0)
Hemoglobin: 12.5 g/dL (ref 11.7–15.5)
Lymphs Abs: 2691 cells/uL (ref 850–3900)
MCH: 27.5 pg (ref 27.0–33.0)
MCHC: 31.8 g/dL — ABNORMAL LOW (ref 32.0–36.0)
MCV: 86.4 fL (ref 80.0–100.0)
MPV: 9.9 fL (ref 7.5–12.5)
Monocytes Relative: 8.9 %
Neutro Abs: 3600 cells/uL (ref 1500–7800)
Neutrophils Relative %: 50.7 %
Platelets: 247 10*3/uL (ref 140–400)
RBC: 4.55 10*6/uL (ref 3.80–5.10)
RDW: 13.1 % (ref 11.0–15.0)
Total Lymphocyte: 37.9 %
WBC: 7.1 10*3/uL (ref 3.8–10.8)

## 2021-04-14 NOTE — Progress Notes (Signed)
Patient is aware of lab results and instructions. -e welch

## 2021-05-04 DIAGNOSIS — N993 Prolapse of vaginal vault after hysterectomy: Secondary | ICD-10-CM | POA: Diagnosis not present

## 2021-06-02 DIAGNOSIS — H401131 Primary open-angle glaucoma, bilateral, mild stage: Secondary | ICD-10-CM | POA: Diagnosis not present

## 2021-06-08 ENCOUNTER — Other Ambulatory Visit: Payer: Self-pay | Admitting: Internal Medicine

## 2021-06-08 DIAGNOSIS — I10 Essential (primary) hypertension: Secondary | ICD-10-CM

## 2021-06-08 MED ORDER — LOSARTAN POTASSIUM-HCTZ 100-25 MG PO TABS: ORAL_TABLET | ORAL | 3 refills | Status: DC

## 2021-07-27 ENCOUNTER — Other Ambulatory Visit: Payer: Self-pay

## 2021-07-27 ENCOUNTER — Encounter: Payer: Self-pay | Admitting: Internal Medicine

## 2021-07-27 ENCOUNTER — Ambulatory Visit (INDEPENDENT_AMBULATORY_CARE_PROVIDER_SITE_OTHER): Payer: PPO | Admitting: Internal Medicine

## 2021-07-27 VITALS — BP 132/78 | HR 50 | Temp 97.8°F | Resp 16 | Ht 63.0 in | Wt 190.2 lb

## 2021-07-27 DIAGNOSIS — R7303 Prediabetes: Secondary | ICD-10-CM

## 2021-07-27 DIAGNOSIS — Z79899 Other long term (current) drug therapy: Secondary | ICD-10-CM

## 2021-07-27 DIAGNOSIS — I10 Essential (primary) hypertension: Secondary | ICD-10-CM

## 2021-07-27 DIAGNOSIS — N182 Chronic kidney disease, stage 2 (mild): Secondary | ICD-10-CM | POA: Diagnosis not present

## 2021-07-27 DIAGNOSIS — E559 Vitamin D deficiency, unspecified: Secondary | ICD-10-CM

## 2021-07-27 DIAGNOSIS — R7309 Other abnormal glucose: Secondary | ICD-10-CM

## 2021-07-27 DIAGNOSIS — E782 Mixed hyperlipidemia: Secondary | ICD-10-CM

## 2021-07-27 NOTE — Patient Instructions (Signed)

## 2021-07-27 NOTE — Progress Notes (Signed)
Future Appointments  Date Time Provider [place]  07/27/2021 10:30 AM Unk Pinto, MD [facility] None  01/20/2022 10:00 AM Unk Pinto, MD [facility] None    History of Present Illness:       [name] presents for 6 month follow up with HTN, HLD, Pre-Diabetes and Vitamin D Deficiency.        Patient is treated for HTN  (2002) & BP has been controlled  on meds at home. Today's BP is at goal  - 132/78.  Patient has hx/o hospitalization in 2011 w/ARF from dehydration & NSAID's rescued with IVF. She had a 2sd episode of ARF in [address] with Pneumonea & Dehydration & required dialysis during hthaat admission & recovered back to her baseline CKD2. Patient has had no complaints of any cardiac type chest pain, palpitations, dyspnea / orthopnea / PND, dizziness, claudication, or dependent edema.       Hyperlipidemia is controlled with diet & Atorvastatin. Patient denies myalgias or other med SE's. Last Lipids were at goal:  Lab Results  Component Value Date   CHOL 122 04/13/2021   HDL 41 (L) 04/13/2021   LDLCALC 68 04/13/2021   LDLDIRECT 160.1 07/18/2009   TRIG 57 04/13/2021   CHOLHDL 3.0 04/13/2021     Also, the patient has history of PreDiabetes (A1c 6.2% /2012) and has had no symptoms of reactive hypoglycemia, diabetic polys, paresthesias or visual blurring.  Last A1c was not at goal:  Lab Results  Component Value Date   HGBA1C 6.3 (H) 01/12/2021                                                          Further, the patient also has history of Vitamin D Deficiency ("12" /2012) and supplements vitamin D without any suspected side-effects. Last vitamin D was at goal:   Lab Results  Component Value Date   VD25OH 76 01/12/2021     Current Outpatient Medications on File Prior to Visit  Medication Sig   acetaminophen 500 MG tablet Take  every 6 hours as needed    aspirin EC 81 MG tablet Take  daily.   atorvastatin  40 MG tablet Take  1  tablet  Daily  for Cholesterol   bisoprolol  5 MG tablet Take  1 tablet  Daily  for BP & Heart skips.   VITAMIN D 5,000 Units  Take  daily.   cyclobenzaprine 10 MG tablet Take 0.5-1 tablets 3 times daily as needed    losartan-hctz 100-25 MG tablet Take  1 tablet  Daily  for BP    Magnesium 400 MG TABS Take 1 tablet  daily.   Potassium Chloride ER 20 MEQ  Take 1 tablet Daily for Potassium     Allergies  Allergen Reactions   Ace Inhibitors renal failure   Naproxen renal failure   Norvasc [Amlodipine] edema   Nsaids renal failure    PMHx:   Past Medical History:  Diagnosis Date   ARF (acute renal failure) ([place]) 10/28/2015   CAP (community acquired pneumonia) 11/12/2015   Hyperlipidemia    Hypertension    Prediabetes    SYNDROME, NEPHROTIC W/MINIMAL CHANGE LESION 07/12/2007   Vitamin D deficiency      Immunization History  Administered Date(s) Administered  Influenza, High Dose  08/28/2018, 10/22/2019, 11/05/2020   Influenza,inj,quad 10/25/2016   Influenza,trivalent, recombinat 09/07/2015   Influenza 08/30/2015   PFIZER SARS-COV-2 Vacc 12/24/2019, 01/14/2020, 09/02/2020   Pneumococcal -13 01/30/2018   Pneumococcal -23 10/29/2015   Td 02/19/2000    Past Surgical History:  Procedure Laterality Date   ABDOMINAL HYSTERECTOMY     EYE SURGERY Left 2017   Cataract sugery left eye    FHx:    Reviewed / unchanged  SHx:    Reviewed / unchanged   Systems Review:  Constitutional: Denies fever, chills, wt changes, headaches, insomnia, fatigue, night sweats, change in appetite. Eyes: Denies redness, blurred vision, diplopia, discharge, itchy, watery eyes.  ENT: Denies discharge, congestion, post nasal drip, epistaxis, sore throat, earache, hearing loss, dental pain, tinnitus, vertigo, sinus pain, snoring.  CV: Denies chest pain, palpitations, irregular heartbeat, syncope, dyspnea, diaphoresis, orthopnea, PND, claudication or edema. Respiratory: denies cough, dyspnea, DOE,  pleurisy, hoarseness, laryngitis, wheezing.  Gastrointestinal: Denies dysphagia, odynophagia, heartburn, reflux, water brash, abdominal pain or cramps, nausea, vomiting, bloating, diarrhea, constipation, hematemesis, melena, hematochezia  or hemorrhoids. Genitourinary: Denies dysuria, frequency, urgency, nocturia, hesitancy, discharge, hematuria or flank pain. Musculoskeletal: Denies arthralgias, myalgias, stiffness, jt. swelling, pain, limping or strain/sprain.  Skin: Denies pruritus, rash, hives, warts, acne, eczema or change in skin lesion(s). Neuro: No weakness, tremor, incoordination, spasms, paresthesia or pain. Psychiatric: Denies confusion, memory loss or sensory loss. Endo: Denies change in weight, skin or hair change.  Heme/Lymph: No excessive bleeding, bruising or enlarged lymph nodes.  Physical Exam  BP 132/78   Pulse (!) 50   Temp 97.8 F (36.6 C)   Resp 16   Ht '5\' 3"'$  (1.6 m)   Wt 190 lb 3.2 oz (86.3 kg)   SpO2 98%   BMI 33.69 kg/m   Appears  well nourished, well groomed  and in no distress.  Eyes: PERRLA, EOMs, conjunctiva no swelling or erythema. Sinuses: No frontal/maxillary tenderness ENT/Mouth: EAC's clear, TM's nl w/o erythema, bulging. Nares clear w/o erythema, swelling, exudates. Oropharynx clear without erythema or exudates. Oral hygiene is good. Tongue normal, non obstructing. Hearing intact.  Neck: Supple. Thyroid not palpable. Car 2+/2+ without bruits, nodes or JVD. Chest: Respirations nl with BS clear & equal w/o rales, rhonchi, wheezing or stridor.  Cor: Heart sounds normal w/ regular rate and rhythm without sig. murmurs, gallops, clicks or rubs. Peripheral pulses normal and equal  without edema.  Abdomen: Soft & bowel sounds normal. Non-tender w/o guarding, rebound, hernias, masses or organomegaly.  Lymphatics: Unremarkable.  Musculoskeletal: Full ROM all peripheral extremities, joint stability, 5/5 strength and normal gait.  Skin: Warm, dry without  exposed rashes, lesions or ecchymosis apparent.  Neuro: Cranial nerves intact, reflexes equal bilaterally. Sensory-motor testing grossly intact. Tendon reflexes grossly intact.  Pysch: Alert & oriented x 3.  Insight and judgement nl & appropriate. No ideations.  Assessment and Plan:  1. Essential hypertension  - Continue medication, monitor blood pressure at home.  - Continue DASH diet.  Reminder to go to the ER if any CP,  SOB, nausea, dizziness, severe HA, changes vision/speech.   - CBC with Differential/Platelet - COMPLETE METABOLIC PANEL WITH GFR - Magnesium - TSH  2. Hyperlipidemia, mixed  - Continue diet/meds, exercise,& lifestyle modifications.  - Continue monitor periodic cholesterol/liver & renal functions    - Lipid panel - TSH  3. Abnormal glucose  - Continue diet, exercise  - Lifestyle modifications.  - Monitor appropriate labs   - Hemoglobin A1c -  Insulin, random  4. Vitamin D deficiency  - Continue supplementation.    - VITAMIN D 25 Hydroxy   5. Prediabetes  - Hemoglobin A1c - Insulin, random  6. Chronic kidney disease (CKD) stage 2  - COMPLETE METABOLIC PANEL WITH GFR  7. Medication management  - CBC with Differential/Platelet - COMPLETE METABOLIC PANEL WITH GFR - Magnesium - Lipid panel - TSH - Hemoglobin A1c - Insulin, random - VITAMIN D 25 Hydroxy         Discussed  regular exercise, BP monitoring, weight control to achieve/maintain BMI less than 25 and discussed med and SE's. Recommended labs to assess and monitor clinical status with further disposition pending results of labs.  I discussed the assessment and treatment plan with the patient. The patient was provided an opportunity to ask questions and all were answered. The patient agreed with the plan and demonstrated an understanding of the instructions.  I provided over 30 minutes of exam, counseling, chart review and  complex critical decision making.        The patient was  advised to call back or seek an in-person evaluation if the symptoms worsen or if the condition fails to improve as anticipated.   Kirtland Bouchard, MD

## 2021-07-28 LAB — CBC WITH DIFFERENTIAL/PLATELET
Absolute Monocytes: 540 {cells}/uL (ref 200–950)
Basophils Absolute: 57 {cells}/uL (ref 0–200)
Basophils Relative: 0.8 %
Eosinophils Absolute: 163 {cells}/uL (ref 15–500)
Eosinophils Relative: 2.3 %
HCT: 40.5 % (ref 35.0–45.0)
Hemoglobin: 12.8 g/dL (ref 11.7–15.5)
Lymphs Abs: 2265 {cells}/uL (ref 850–3900)
MCH: 27.1 pg (ref 27.0–33.0)
MCHC: 31.6 g/dL — ABNORMAL LOW (ref 32.0–36.0)
MCV: 85.6 fL (ref 80.0–100.0)
MPV: 9.6 fL (ref 7.5–12.5)
Monocytes Relative: 7.6 %
Neutro Abs: 4075 {cells}/uL (ref 1500–7800)
Neutrophils Relative %: 57.4 %
Platelets: 268 10*3/uL (ref 140–400)
RBC: 4.73 Million/uL (ref 3.80–5.10)
RDW: 12.7 % (ref 11.0–15.0)
Total Lymphocyte: 31.9 %
WBC: 7.1 10*3/uL (ref 3.8–10.8)

## 2021-07-28 LAB — COMPLETE METABOLIC PANEL WITHOUT GFR
AG Ratio: 1.2 (calc) (ref 1.0–2.5)
ALT: 13 U/L (ref 6–29)
AST: 16 U/L (ref 10–35)
Albumin: 4.1 g/dL (ref 3.6–5.1)
Alkaline phosphatase (APISO): 66 U/L (ref 37–153)
BUN: 14 mg/dL (ref 7–25)
CO2: 29 mmol/L (ref 20–32)
Calcium: 9.5 mg/dL (ref 8.6–10.4)
Chloride: 103 mmol/L (ref 98–110)
Creat: 0.89 mg/dL (ref 0.60–1.00)
Globulin: 3.3 g/dL (ref 1.9–3.7)
Glucose, Bld: 123 mg/dL — ABNORMAL HIGH (ref 65–99)
Potassium: 4 mmol/L (ref 3.5–5.3)
Sodium: 141 mmol/L (ref 135–146)
Total Bilirubin: 0.6 mg/dL (ref 0.2–1.2)
Total Protein: 7.4 g/dL (ref 6.1–8.1)
eGFR: 69 mL/min/{1.73_m2}

## 2021-07-28 LAB — LIPID PANEL
Cholesterol: 122 mg/dL
HDL: 41 mg/dL — ABNORMAL LOW
LDL Cholesterol (Calc): 66 mg/dL
Non-HDL Cholesterol (Calc): 81 mg/dL
Total CHOL/HDL Ratio: 3 (calc)
Triglycerides: 73 mg/dL

## 2021-07-28 LAB — HEMOGLOBIN A1C
Hgb A1c MFr Bld: 6.2 % of total Hgb — ABNORMAL HIGH (ref <5.7)
Mean Plasma Glucose: 131 mg/dL
eAG (mmol/L): 7.3 mmol/L

## 2021-07-28 LAB — INSULIN, RANDOM: Insulin: 40.4 u[IU]/mL — ABNORMAL HIGH

## 2021-07-28 LAB — MAGNESIUM: Magnesium: 2.1 mg/dL (ref 1.5–2.5)

## 2021-07-28 LAB — VITAMIN D 25 HYDROXY (VIT D DEFICIENCY, FRACTURES): Vit D, 25-Hydroxy: 76 ng/mL (ref 30–100)

## 2021-07-28 LAB — TSH: TSH: 1.5 mIU/L (ref 0.40–4.50)

## 2021-07-28 NOTE — Progress Notes (Signed)
============================================================ ============================================================  -   Total Chol = 122 -    Excellent   - Very low risk for Heart Attack  / Stroke ============================================================ ============================================================  -  A1c = 6.2% - Still too high  \         (  Ideal or Goal is less than 5.7%  )  ============================================================ ============================================================  -  Vitamin D = 76 - Excellent - Please Keep Dose Same  ============================================================ ============================================================  -  All Else - CBC - Kidneys - Electrolytes - Liver - Magnesium & Thyroid    - all  Normal / OK ============================================================ ============================================================

## 2021-10-27 DIAGNOSIS — Z4689 Encounter for fitting and adjustment of other specified devices: Secondary | ICD-10-CM | POA: Diagnosis not present

## 2021-11-11 ENCOUNTER — Encounter: Payer: Self-pay | Admitting: Internal Medicine

## 2021-12-10 ENCOUNTER — Ambulatory Visit (HOSPITAL_COMMUNITY)
Admission: EM | Admit: 2021-12-10 | Discharge: 2021-12-10 | Disposition: A | Discharge status: Home / Self Care | Payer: PPO | Attending: Family Medicine | Admitting: Family Medicine

## 2021-12-10 ENCOUNTER — Encounter (HOSPITAL_COMMUNITY): Payer: Self-pay

## 2021-12-10 ENCOUNTER — Other Ambulatory Visit: Payer: Self-pay

## 2021-12-10 ENCOUNTER — Ambulatory Visit (INDEPENDENT_AMBULATORY_CARE_PROVIDER_SITE_OTHER): Payer: PPO

## 2021-12-10 DIAGNOSIS — W19XXXA Unspecified fall, initial encounter: Secondary | ICD-10-CM | POA: Diagnosis not present

## 2021-12-10 DIAGNOSIS — R0781 Pleurodynia: Secondary | ICD-10-CM | POA: Diagnosis not present

## 2021-12-10 DIAGNOSIS — R079 Chest pain, unspecified: Secondary | ICD-10-CM | POA: Diagnosis not present

## 2021-12-10 DIAGNOSIS — Z043 Encounter for examination and observation following other accident: Secondary | ICD-10-CM | POA: Diagnosis not present

## 2021-12-10 DIAGNOSIS — M25522 Pain in left elbow: Secondary | ICD-10-CM | POA: Diagnosis not present

## 2021-12-10 NOTE — ED Triage Notes (Signed)
Pt presents to urgent care for recent fall one week ago. She is complaining of left side back pain. She would like an x-ray today.

## 2021-12-10 NOTE — Discharge Instructions (Signed)
Tylenol 325 mg two every 4 hours as needed for pain or fever

## 2021-12-10 NOTE — ED Provider Notes (Signed)
[facility]    CSN: [id number] Arrival date & time: 12/10/21  1036      History   Chief Complaint Chief Complaint  Patient presents with   Fall    HPI Lydia Edwards is a 71 y.o. female.    Fall  Here for left lower rib cage pain.  She also has had some left elbow pain.  1 week ago today she fell forward when she slipped on the ground.  No loss of consciousness.  Past Medical History:  Diagnosis Date   ARF (acute renal failure) ([place]) 10/28/2015   CAP (community acquired pneumonia) 11/12/2015   Hyperlipidemia    Hypertension    Prediabetes    SYNDROME, NEPHROTIC W/MINIMAL CHANGE LESION 07/12/2007   Vitamin D deficiency     Patient Active Problem List   Diagnosis Date Noted   Female genital prolapse 04/13/2021   Obesity (BMI 30.0-34.9) 10/18/2019   FHx: heart disease 08/28/2018   Other abnormal glucose (prediabetes) 10/25/2016   Chronic kidney disease (CKD) stage 2 07/19/2016   Encounter for Medicare annual wellness exam 10/06/2015   Medication management 09/02/2014   Hyperlipidemia    Hypertension    Vitamin D deficiency     Past Surgical History:  Procedure Laterality Date   ABDOMINAL HYSTERECTOMY     EYE SURGERY Left 2017   Cataract sugery left eye    OB History   No obstetric history on file.      Home Medications    Prior to Admission medications   Medication Sig Start Date End Date Taking? Authorizing Provider  acetaminophen (TYLENOL) 500 MG tablet Take 500 mg by mouth every 6 (six) hours as needed for mild pain or headache.     [provider]  aspirin EC 81 MG tablet Take 81 mg by mouth daily.    [provider]  atorvastatin (LIPITOR) 40 MG tablet Take  1 tablet  Daily  for Cholesterol 03/21/21   Unk Pinto, MD  bisoprolol (ZEBETA) 5 MG tablet Take  1 tablet  Daily  for BP & Heart skips. 01/12/21   Unk Pinto, MD  Cholecalciferol (VITAMIN D PO) Take 5,000 Units by mouth daily.    [provider]  cyclobenzaprine (FLEXERIL) 10 MG tablet Take 0.5-1 tablets (5-10 mg total) by mouth 3 (three) times daily as needed for muscle spasms. 04/13/21   [name], NP  losartan-hydrochlorothiazide (HYZAAR) 100-25 MG tablet Take  1 tablet  Daily  for BP / Patient knows to take by mouth 06/08/21   Unk Pinto, MD  Magnesium 400 MG TABS Take 1 tablet by mouth daily.    [provider]  Potassium Chloride ER 20 MEQ TBCR Take 1 tablet Daily for Potassium 06/08/21   Unk Pinto, MD    Family History Family History  Problem Relation Age of Onset   Stroke Sister    Hypertension Sister    Cancer Sister    Heart attack Brother    Kidney disease Brother    Hypertension Sister    Diabetes Sister    Kidney disease Sister    Colon cancer Neg Hx    Breast cancer Neg Hx     Social History Social History   Tobacco Use   Smoking status: Never   Smokeless tobacco: Never  Substance Use Topics   Alcohol use: No    Alcohol/week: 0.0 standard drinks   Drug use: No     Allergies   Ace inhibitors, Naproxen, Norvasc [amlodipine  besylate], and Nsaids   Review of Systems Review of Systems   Physical Exam Triage Vital Signs ED Triage Vitals  Enc Vitals Group     BP 12/10/21 1137 (!) 152/78     Pulse Rate 12/10/21 1137 (!) 53     Resp 12/10/21 1137 16     Temp 12/10/21 1137 98.3 F (36.8 C)     Temp Source 12/10/21 1137 Oral     SpO2 12/10/21 1137 100 %     Weight --      Height --      Head Circumference --      Peak Flow --      Pain Score 12/10/21 1138 6     Pain Loc --      Pain Edu? --      Excl. in [place]? --    No data found.  Updated Vital Signs BP (!) 152/78 (BP Location: Left Arm)    Pulse (!) 53    Temp 98.3 F (36.8 C) (Oral)    Resp 16    SpO2 100%   Visual Acuity Right Eye Distance:   Left Eye Distance:   Bilateral Distance:    Right Eye Near:   Left Eye Near:    Bilateral Near:     Physical Exam Vitals reviewed.  Cardiovascular:      Rate and Rhythm: Normal rate and regular rhythm.     Heart sounds: No murmur heard. Pulmonary:     Breath sounds: No stridor. No wheezing, rhonchi or rales.  Musculoskeletal:     Comments: Ecchymosis noted on her left proximal forearm  Skin:    Coloration: Skin is not jaundiced or pale.  Neurological:     General: No focal deficit present.     Mental Status: She is alert and oriented to person, place, and time.  Psychiatric:        Behavior: Behavior normal.     UC Treatments / Results  Labs (all labs ordered are listed, but only abnormal results are displayed) Labs Reviewed - No data to display  EKG   Radiology DG Ribs Unilateral W/Chest Left  Result Date: 12/10/2021 CLINICAL DATA:  Fall EXAM: LEFT RIBS AND CHEST - 3+ VIEW COMPARISON:  Radiograph 11/10/2015 FINDINGS: Unchanged cardiomediastinal silhouette. There is no focal airspace consolidation. There is no large pleural effusion. There is no visible pneumothorax. There is no acute osseous abnormality. Thoracic spondylosis. There is no evidence of a displaced rib fracture. Thoracic spondylosis. Moderate-severe left glenohumeral osteoarthritis. IMPRESSION: No evidence of displaced rib fracture. No acute cardiopulmonary disease. Electronically Signed   By: Maurine Simmering M.D.   On: 12/10/2021 12:28   DG Elbow Complete Left  Result Date: 12/10/2021 CLINICAL DATA:  Left elbow pain EXAM: LEFT ELBOW - COMPLETE 3+ VIEW COMPARISON:  None. FINDINGS: There is no evidence of acute fracture or dislocation. No joint effusion. No significant degenerative changes. IMPRESSION: Negative left elbow radiographs. Electronically Signed   By: Maurine Simmering M.D.   On: 12/10/2021 12:27    Procedures Procedures (including critical care time)  Medications Ordered in UC Medications - No data to display  Initial Impression / Assessment and Plan / UC Course  I have reviewed the triage vital signs and the nursing notes.  Pertinent labs & imaging results  that were available during my care of the patient were reviewed by me and considered in my medical decision making (see chart for details).     Xrays negative for fracture. Will  take tylenol as needed. Final Clinical Impressions(s) / UC Diagnoses   Final diagnoses:  Left elbow pain  Rib pain on left side     Discharge Instructions      Tylenol 325 mg two every 4 hours as needed for pain or fever       ED Prescriptions   None    PDMP not reviewed this encounter.   Barrett Henle, MD 12/10/21 1240

## 2022-01-13 NOTE — Progress Notes (Signed)
Complete Physical  Assessment:    Encounter for General adult medical examination with abnormal findings 1 year Schedule mammogram in 02/2022  Essential hypertension - continue medications, DASH diet, exercise and monitor at home. Call if greater than 130/80.  -     CBC with Differential/Platelet -     CMP/GFR -     TSH  Mixed hyperlipidemia -continue medications, check lipids, decrease fatty foods, increase activity.  -     Lipid panel  Chronic kidney disease (CKD) stage 2/ history of nephrotic syndrome Increase fluids, avoid NSAIDS, monitor sugars, will monitor -     CMP/GFR  Sinus bradycardia on ECG Decrease bisoprolol 5 mg to 1/2 tab daily Monitor BP and pulse Go to the ER if any chest pain, shortness of breath, nausea, dizziness, severe HA, changes vision/speech   Medication management CBC, CMP/GFR, magnesium   Vitamin D deficiency Continue supplement  Other abnormal glucose (prediabetes) Discussed disease and risks Discussed diet/exercise, weight management  Check A1C q39m; CMP for glucose otherwise  Obesity with co morbidities Long discussion about weight loss, diet, and exercise Discussed ideal weight for height and initial weight goal (<180lb) Patient will work on cutting out sweet tea, processed carbohydrate and saturated fats Will follow up in 3 months  Screening for ischemic heart disease EKG  Screening for hematuria/proteinuria -Routine UA with reflex microscopic -Microalbumin/creatinine urine ratio   Over 30 minutes of exam, counseling, chart review, and critical decision making was performed Future Appointments  Date Time Provider [place]  01/15/2022  9:00 AM Magda Bernheim, NP [facility] None  01/17/2023  9:00 AM Magda Bernheim, NP [facility] None     Subjective:   Lydia Edwards is a 71 y.o. female who presents for Medicare Annual Wellness Visit and 3 month follow up on hypertension, prediabetes, hyperlipidemia, vitamin D def.   She  works for [employer], didn't want to retire, working 2-3 days/week- plans to retire soon.  She fell at work on 12/03/21 and hurt right shoulder. She had PT through work.  Shoulder is doing well now.  She reports s/p hysterectomy, some bladder prolapse, she does wear a pessary through GYN office. She has noticed relief in symptoms with use of pessary  BMI is Body mass index is 34.51 kg/m., she is working on diet, no intentional exercise but physically active job. She is drinking sweet tea 24 ounces a day and one 12 ounce water.  Wt Readings from Last 3 Encounters:  01/15/22 194 lb 12.8 oz (88.4 kg)  07/27/21 190 lb 3.2 oz (86.3 kg)  04/13/21 193 lb (87.5 kg)   Her blood pressure has been controlled at home, today their BP is BP: 128/74 BP Readings from Last 3 Encounters:  01/15/22 128/74  12/10/21 (!) 152/78  07/27/21 132/78    She does workout.  She does a lot of walking.  She walks at least a mile daily when at work. She denies chest pain, shortness of breath, dizziness.   She is on cholesterol medication (lipitor 40 mg daily) and denies myalgias. Her cholesterol is at goal. The cholesterol last visit was:   Lab Results  Component Value Date   CHOL 122 07/27/2021   HDL 41 (L) 07/27/2021   LDLCALC 66 07/27/2021   LDLDIRECT 160.1 07/18/2009   TRIG 73 07/27/2021   CHOLHDL 3.0 07/27/2021   She has been working on diet and exercise for prediabetes, SHE HAS BEEN DRINKING SWEET TEA, has cut back significantly, and denies foot ulcerations, hyperglycemia,  hypoglycemia , increased appetite, nausea, paresthesia of the feet, polydipsia, polyuria, visual disturbances, vomiting and weight loss.  Last A1C in the office was:  Lab Results  Component Value Date   HGBA1C 6.2 (H) 07/27/2021   Patient is on Vitamin D supplement, at goal recent check:  Lab Results  Component Value Date   VD25OH 76 07/27/2021     She has a history of nephrotic syndrome, saw Dr. Lorrene Reid for that, has been discharged and we  are still monitoring kidney functions.  Lab Results  Component Value Date   GFRAA 82 04/13/2021     Medication Review   Current Outpatient Medications (Cardiovascular):    atorvastatin (LIPITOR) 40 MG tablet, Take  1 tablet  Daily  for Cholesterol   bisoprolol (ZEBETA) 5 MG tablet, Take  1 tablet  Daily  for BP & Heart skips.   losartan-hydrochlorothiazide (HYZAAR) 100-25 MG tablet, Take  1 tablet  Daily  for BP / Patient knows to take by mouth   Current Outpatient Medications (Analgesics):    acetaminophen (TYLENOL) 500 MG tablet, Take 500 mg by mouth every 6 (six) hours as needed for mild pain or headache.    aspirin EC 81 MG tablet, Take 81 mg by mouth daily.   Current Outpatient Medications (Other):    Cholecalciferol (VITAMIN D PO), Take 5,000 Units by mouth daily.   cyclobenzaprine (FLEXERIL) 10 MG tablet, Take 0.5-1 tablets (5-10 mg total) by mouth 3 (three) times daily as needed for muscle spasms.   Magnesium 400 MG TABS, Take 1 tablet by mouth daily.   Potassium Chloride ER 20 MEQ TBCR, Take 1 tablet Daily for Potassium  Current Problems (verified) Patient Active Problem List   Diagnosis Date Noted   Female genital prolapse 04/13/2021   Obesity (BMI 30.0-34.9) 10/18/2019   FHx: heart disease 08/28/2018   Other abnormal glucose (prediabetes) 10/25/2016   Chronic kidney disease (CKD) stage 2 07/19/2016   Encounter for Medicare annual wellness exam 10/06/2015   Medication management 09/02/2014   Hyperlipidemia    Hypertension    Vitamin D deficiency     Screening Tests Immunization History  Administered Date(s) Administered   Influenza, High Dose Seasonal PF 08/28/2018, 10/22/2019, 11/05/2020   Influenza,inj,quad, With Preservative 10/25/2016   Influenza,trivalent, recombinat, inj, PF 09/07/2015   Influenza-Unspecified 08/30/2015   PFIZER(Purple Top)SARS-COV-2 Vaccination 12/24/2019, 01/14/2020, 09/02/2020   Pneumococcal Conjugate-13 01/30/2018   Pneumococcal  Polysaccharide-23 10/29/2015   Td 02/19/2000    Preventative care: Last colonoscopy: 08/2015, normal due 2026 Last mammogram: 02/2021 Last pap smear/pelvic exam:  S/p hysterectomy, ovaries spared, referring back to GYN today DEXA: 05/2016 normal -0.6  Prior vaccinations: TD or Tdap: will boost PRN Influenza: 2022- had at [facility] Pneumococcal: 2016 Prevnar13: 2019 Shingles/Zostavax: declines COVID vaccine: 3/3, pfizer 2022 variant booster  Names of Other Physician/Practitioners you currently use: 1. [facility]- here for primary care 2. Dr. Katy Fitch, eye doctor, 2021, has scheduled in June 3. [facility], last visit 2018, DUE  Patient Care Team: Unk Pinto, MD as PCP - General (Internal Medicine) Vania Rea, MD as Consulting Physician (Obstetrics and Gynecology) Lafayette Dragon, MD (Inactive) as Consulting Physician (Gastroenterology)  Allergies Allergies  Allergen Reactions   Ace Inhibitors    Naproxen     REACTION: renal failure   Norvasc [Amlodipine Besylate]     edema   Nsaids     SURGICAL HISTORY She  has a past surgical history that includes Abdominal hysterectomy and Eye  surgery (Left, 2017). FAMILY HISTORY Her family history includes Cancer in her sister; Diabetes in her sister; Heart attack in her brother; Hypertension in her sister and sister; Kidney disease in her brother and sister; Stroke in her sister.  Heart attack brother 76 SOCIAL HISTORY She  reports that she has never smoked. She has never used smokeless tobacco. She reports that she does not drink alcohol and does not use drugs.   Review of Systems  Constitutional:  Negative for chills, fever, malaise/fatigue and weight loss.  HENT:  Negative for congestion, ear pain, hearing loss, sinus pain, sore throat and tinnitus.   Eyes: Negative.  Negative for blurred vision and double vision.  Respiratory:  Negative for cough, hemoptysis, sputum  production, shortness of breath and wheezing.   Cardiovascular:  Negative for chest pain, palpitations and leg swelling.  Gastrointestinal:  Negative for abdominal pain, blood in stool, constipation, diarrhea, heartburn, melena, nausea and vomiting.  Genitourinary:  Negative for dysuria, frequency, hematuria and urgency.  Musculoskeletal:  Negative for back pain, falls, joint pain, myalgias and neck pain.  Skin: Negative.  Negative for rash.  Neurological:  Negative for dizziness, tingling, tremors, sensory change, loss of consciousness, weakness and headaches.  Endo/Heme/Allergies:  Negative for environmental allergies. Does not bruise/bleed easily.  Psychiatric/Behavioral:  Negative for depression and suicidal ideas. The patient is not nervous/anxious and does not have insomnia.    Objective:   Today's Vitals   01/15/22 0854  BP: 128/74  Pulse: (!) 56  Resp: 16  Temp: 97.9 F (36.6 C)  SpO2: 97%  Weight: 194 lb 12.8 oz (88.4 kg)  Height: 5\' 3"  (1.6 m)   Body mass index is 34.51 kg/m.  General appearance: alert, no distress, WD/WN,  female HEENT: normocephalic, sclerae anicteric, TMs pearly, nares patent, no discharge or erythema, pharynx normal Oral cavity: MMM, no lesions Neck: supple, no lymphadenopathy, no thyromegaly, no masses Heart: RRR, normal S1, S2, no murmurs Lungs: CTA bilaterally, no wheezes, rhonchi, or rales Abdomen: +bs, soft, non tender, non distended, no masses, no hepatomegaly, no splenomegaly Musculoskeletal: nontender, no swelling, no obvious deformity.   Extremities: no edema, no cyanosis, no clubbing Pulses: 2+ symmetric, upper and lower extremities, normal cap refill Neurological: alert, oriented x 3, CN2-12 intact, strength normal upper extremities and lower extremities, sensation normal throughout, DTRs 2+ throughout, no cerebellar signs, gait normal Psychiatric: normal affect, behavior normal, pleasant  Breast: defer to GYN Pelvic: Defer to GYN EKG:  Sinus bradycardia, AV Block I     Magda Bernheim, NP   01/15/2022

## 2022-01-15 ENCOUNTER — Ambulatory Visit (INDEPENDENT_AMBULATORY_CARE_PROVIDER_SITE_OTHER): Payer: PPO | Admitting: Nurse Practitioner

## 2022-01-15 ENCOUNTER — Other Ambulatory Visit: Payer: Self-pay

## 2022-01-15 ENCOUNTER — Encounter: Payer: Self-pay | Admitting: Nurse Practitioner

## 2022-01-15 VITALS — BP 128/74 | HR 56 | Temp 97.9°F | Resp 16 | Ht 63.0 in | Wt 194.8 lb

## 2022-01-15 DIAGNOSIS — I10 Essential (primary) hypertension: Secondary | ICD-10-CM | POA: Diagnosis not present

## 2022-01-15 DIAGNOSIS — E669 Obesity, unspecified: Secondary | ICD-10-CM | POA: Diagnosis not present

## 2022-01-15 DIAGNOSIS — E782 Mixed hyperlipidemia: Secondary | ICD-10-CM

## 2022-01-15 DIAGNOSIS — N182 Chronic kidney disease, stage 2 (mild): Secondary | ICD-10-CM | POA: Diagnosis not present

## 2022-01-15 DIAGNOSIS — R7309 Other abnormal glucose: Secondary | ICD-10-CM | POA: Diagnosis not present

## 2022-01-15 DIAGNOSIS — E559 Vitamin D deficiency, unspecified: Secondary | ICD-10-CM | POA: Diagnosis not present

## 2022-01-15 DIAGNOSIS — Z136 Encounter for screening for cardiovascular disorders: Secondary | ICD-10-CM | POA: Diagnosis not present

## 2022-01-15 DIAGNOSIS — Z1389 Encounter for screening for other disorder: Secondary | ICD-10-CM

## 2022-01-15 DIAGNOSIS — Z0000 Encounter for general adult medical examination without abnormal findings: Secondary | ICD-10-CM | POA: Diagnosis not present

## 2022-01-15 DIAGNOSIS — Z0001 Encounter for general adult medical examination with abnormal findings: Secondary | ICD-10-CM

## 2022-01-15 DIAGNOSIS — Z79899 Other long term (current) drug therapy: Secondary | ICD-10-CM

## 2022-01-15 NOTE — Patient Instructions (Addendum)
Cut bisoprolol in half and monitor BP, if consistently greater than 130/80 please call the office Go to the ER if any chest pain, shortness of breath, nausea, dizziness, severe HA, changes vision/speech   GENERAL HEALTH GOALS   Know what a healthy weight is for you (roughly BMI <25) and aim to maintain this   Aim for 7+ servings of fruits and vegetables daily   70-80+ fluid ounces of water or unsweet tea for healthy kidneys   Limit to max 1 drink of alcohol per day; avoid smoking/tobacco   Limit animal fats in diet for cholesterol and heart health - choose grass fed whenever available   Avoid highly processed foods, and foods high in saturated/trans fats   Aim for low stress - take time to unwind and care for your mental health   Aim for 150 min of moderate intensity exercise weekly for heart health, and weights twice weekly for bone health   Aim for 7-9 hours of sleep daily

## 2022-01-16 ENCOUNTER — Other Ambulatory Visit: Payer: Self-pay | Admitting: Nurse Practitioner

## 2022-01-16 DIAGNOSIS — D649 Anemia, unspecified: Secondary | ICD-10-CM

## 2022-01-16 LAB — COMPLETE METABOLIC PANEL WITH GFR
AG Ratio: 1.6 (calc) (ref 1.0–2.5)
ALT: 10 U/L (ref 6–29)
AST: 13 U/L (ref 10–35)
Albumin: 4.1 g/dL (ref 3.6–5.1)
Alkaline phosphatase (APISO): 55 U/L (ref 37–153)
BUN: 15 mg/dL (ref 7–25)
CO2: 30 mmol/L (ref 20–32)
Calcium: 9.3 mg/dL (ref 8.6–10.4)
Chloride: 106 mmol/L (ref 98–110)
Creat: 0.92 mg/dL (ref 0.60–1.00)
Globulin: 2.6 g/dL (calc) (ref 1.9–3.7)
Glucose, Bld: 116 mg/dL — ABNORMAL HIGH (ref 65–99)
Potassium: 4.2 mmol/L (ref 3.5–5.3)
Sodium: 143 mmol/L (ref 135–146)
Total Bilirubin: 0.6 mg/dL (ref 0.2–1.2)
Total Protein: 6.7 g/dL (ref 6.1–8.1)
eGFR: 67 mL/min/{1.73_m2} (ref >=60)

## 2022-01-16 LAB — MICROALBUMIN / CREATININE URINE RATIO
Creatinine, Urine: 79 mg/dL (ref 20–275)
Microalb Creat Ratio: 8 mcg/mg creat (ref <30)
Microalb, Ur: 0.6 mg/dL

## 2022-01-16 LAB — CBC WITH DIFFERENTIAL/PLATELET
Absolute Monocytes: 605 cells/uL (ref 200–950)
Basophils Absolute: 48 cells/uL (ref 0–200)
Basophils Relative: 0.7 %
Eosinophils Absolute: 163 cells/uL (ref 15–500)
Eosinophils Relative: 2.4 %
HCT: 34 % — ABNORMAL LOW (ref 35.0–45.0)
Hemoglobin: 10.7 g/dL — ABNORMAL LOW (ref 11.7–15.5)
Lymphs Abs: 2312 cells/uL (ref 850–3900)
MCH: 27.1 pg (ref 27.0–33.0)
MCHC: 31.5 g/dL — ABNORMAL LOW (ref 32.0–36.0)
MCV: 86.1 fL (ref 80.0–100.0)
MPV: 9.6 fL (ref 7.5–12.5)
Monocytes Relative: 8.9 %
Neutro Abs: 3672 cells/uL (ref 1500–7800)
Neutrophils Relative %: 54 %
Platelets: 262 10*3/uL (ref 140–400)
RBC: 3.95 10*6/uL (ref 3.80–5.10)
RDW: 12.8 % (ref 11.0–15.0)
Total Lymphocyte: 34 %
WBC: 6.8 10*3/uL (ref 3.8–10.8)

## 2022-01-16 LAB — URINALYSIS, ROUTINE W REFLEX MICROSCOPIC
Bilirubin Urine: NEGATIVE
Glucose, UA: NEGATIVE
Hgb urine dipstick: NEGATIVE
Hyaline Cast: NONE SEEN /LPF
Ketones, ur: NEGATIVE
Nitrite: NEGATIVE
Protein, ur: NEGATIVE
RBC / HPF: NONE SEEN /HPF (ref 0–2)
Specific Gravity, Urine: 1.014 (ref 1.001–1.035)
pH: 6.5 (ref 5.0–8.0)

## 2022-01-16 LAB — HEMOGLOBIN A1C
Hgb A1c MFr Bld: 6.1 % of total Hgb — ABNORMAL HIGH (ref <5.7)
Mean Plasma Glucose: 128 mg/dL
eAG (mmol/L): 7.1 mmol/L

## 2022-01-16 LAB — TSH: TSH: 1.1 mIU/L (ref 0.40–4.50)

## 2022-01-16 LAB — MICROSCOPIC MESSAGE

## 2022-01-16 LAB — LIPID PANEL
Cholesterol: 120 mg/dL (ref <200)
HDL: 43 mg/dL — ABNORMAL LOW (ref >=50)
LDL Cholesterol (Calc): 64 mg/dL (calc)
Non-HDL Cholesterol (Calc): 77 mg/dL (calc) (ref <130)
Total CHOL/HDL Ratio: 2.8 (calc) (ref <5.0)
Triglycerides: 58 mg/dL (ref <150)

## 2022-01-16 LAB — VITAMIN D 25 HYDROXY (VIT D DEFICIENCY, FRACTURES): Vit D, 25-Hydroxy: 49 ng/mL (ref 30–100)

## 2022-01-16 LAB — MAGNESIUM: Magnesium: 1.8 mg/dL (ref 1.5–2.5)

## 2022-01-20 ENCOUNTER — Encounter: Payer: PPO | Admitting: Internal Medicine

## 2022-01-26 ENCOUNTER — Other Ambulatory Visit: Payer: Self-pay

## 2022-01-26 MED ORDER — BISOPROLOL FUMARATE 5 MG PO TABS: ORAL_TABLET | ORAL | 1 refills | Status: DC

## 2022-02-01 ENCOUNTER — Other Ambulatory Visit: Payer: Self-pay

## 2022-02-01 ENCOUNTER — Other Ambulatory Visit: Payer: PPO

## 2022-02-01 DIAGNOSIS — Z1211 Encounter for screening for malignant neoplasm of colon: Secondary | ICD-10-CM | POA: Diagnosis not present

## 2022-02-01 DIAGNOSIS — Z4689 Encounter for fitting and adjustment of other specified devices: Secondary | ICD-10-CM | POA: Diagnosis not present

## 2022-02-01 DIAGNOSIS — D649 Anemia, unspecified: Secondary | ICD-10-CM

## 2022-02-01 DIAGNOSIS — Z1212 Encounter for screening for malignant neoplasm of rectum: Secondary | ICD-10-CM | POA: Diagnosis not present

## 2022-02-01 LAB — CBC WITH DIFFERENTIAL/PLATELET
Absolute Monocytes: 563 cells/uL (ref 200–950)
Basophils Absolute: 53 cells/uL (ref 0–200)
Basophils Relative: 0.7 %
Eosinophils Absolute: 143 cells/uL (ref 15–500)
Eosinophils Relative: 1.9 %
HCT: 35.3 % (ref 35.0–45.0)
Hemoglobin: 11.1 g/dL — ABNORMAL LOW (ref 11.7–15.5)
Lymphs Abs: 2723 cells/uL (ref 850–3900)
MCH: 26.9 pg — ABNORMAL LOW (ref 27.0–33.0)
MCHC: 31.4 g/dL — ABNORMAL LOW (ref 32.0–36.0)
MCV: 85.5 fL (ref 80.0–100.0)
MPV: 9.4 fL (ref 7.5–12.5)
Monocytes Relative: 7.5 %
Neutro Abs: 4020 cells/uL (ref 1500–7800)
Neutrophils Relative %: 53.6 %
Platelets: 257 10*3/uL (ref 140–400)
RBC: 4.13 10*6/uL (ref 3.80–5.10)
RDW: 12.8 % (ref 11.0–15.0)
Total Lymphocyte: 36.3 %
WBC: 7.5 10*3/uL (ref 3.8–10.8)

## 2022-02-01 LAB — POC HEMOCCULT BLD/STL (HOME/3-CARD/SCREEN)
Card #2 Fecal Occult Blod, POC: NEGATIVE
Card #3 Fecal Occult Blood, POC: NEGATIVE
Fecal Occult Blood, POC: NEGATIVE

## 2022-03-01 ENCOUNTER — Other Ambulatory Visit: Payer: Self-pay | Admitting: Internal Medicine

## 2022-03-01 DIAGNOSIS — Z1231 Encounter for screening mammogram for malignant neoplasm of breast: Secondary | ICD-10-CM

## 2022-03-22 ENCOUNTER — Ambulatory Visit
Admission: RE | Admit: 2022-03-22 | Discharge: 2022-03-22 | Disposition: A | Discharge status: Home / Self Care | Payer: PPO | Source: Ambulatory Visit | Attending: Internal Medicine | Admitting: Internal Medicine

## 2022-03-22 DIAGNOSIS — Z1231 Encounter for screening mammogram for malignant neoplasm of breast: Secondary | ICD-10-CM

## 2022-06-14 ENCOUNTER — Other Ambulatory Visit: Payer: Self-pay

## 2022-06-14 DIAGNOSIS — E782 Mixed hyperlipidemia: Secondary | ICD-10-CM

## 2022-06-14 MED ORDER — ATORVASTATIN CALCIUM 40 MG PO TABS: ORAL_TABLET | ORAL | 3 refills | Status: DC

## 2022-06-21 ENCOUNTER — Other Ambulatory Visit: Payer: Self-pay

## 2022-06-21 DIAGNOSIS — E782 Mixed hyperlipidemia: Secondary | ICD-10-CM

## 2022-06-21 MED ORDER — ATORVASTATIN CALCIUM 40 MG PO TABS: ORAL_TABLET | ORAL | 3 refills | Status: DC

## 2022-07-08 DIAGNOSIS — H0102B Squamous blepharitis left eye, upper and lower eyelids: Secondary | ICD-10-CM | POA: Diagnosis not present

## 2022-07-08 DIAGNOSIS — H40013 Open angle with borderline findings, low risk, bilateral: Secondary | ICD-10-CM | POA: Diagnosis not present

## 2022-07-08 DIAGNOSIS — H0102A Squamous blepharitis right eye, upper and lower eyelids: Secondary | ICD-10-CM | POA: Diagnosis not present

## 2022-07-08 DIAGNOSIS — G510 Bell's palsy: Secondary | ICD-10-CM | POA: Diagnosis not present

## 2022-07-08 DIAGNOSIS — Z961 Presence of intraocular lens: Secondary | ICD-10-CM | POA: Diagnosis not present

## 2022-07-21 ENCOUNTER — Ambulatory Visit: Payer: PPO | Admitting: Nurse Practitioner

## 2022-07-23 NOTE — Progress Notes (Unsigned)
FOLLOW UP  Assessment and Plan:   Hypertension Well controlled with current medications : Bisoprolol 5 mg and Hyzaar 100/25 mg QD Monitor blood pressure at home; patient to call if consistently greater than 130/80 Continue DASH diet.   Reminder to go to the ER if any CP, SOB, nausea, dizziness, severe HA, changes vision/speech, left arm numbness and tingling and jaw pain.  Cholesterol Currently at goal;  Continue low cholesterol diet and exercise.  Check lipid panel.   Abnormal Glucose Continue medication: Continue diet and exercise.  Perform daily foot/skin check, notify office of any concerning changes.    Obesity with co morbidities Long discussion about weight loss, diet, and exercise Recommended diet heavy in fruits and veggies and low in animal meats, cheeses, and dairy products, appropriate calorie intake Will follow up in 3 months  CKD stage 2 Increase fluids, avoid NSAIDS, monitor sugars, will monitor  -CBC, CMP  Low Hemoglobin Check Iron/TIBC, ferritin, B12  Left skin lesion Moles with darkening center approx 2 cm on left calf Refer to skin surgery center for removal  Vitamin D Def At goal at last visit; continue supplementation to maintain goal of 60-100 Defer Vit D level  Continue diet and meds as discussed. Further disposition pending results of labs. Discussed med's effects and SE's.   Over 30 minutes of exam, counseling, chart review, and critical decision making was performed.   Future Appointments  Date Time Provider [place]  01/17/2023  9:00 AM Alycia Rossetti, NP [facility] None    ----------------------------------------------------------------------------------------------------------------------  HPI 71 y.o. female  presents for 3 month follow up on hypertension, cholesterol, diabetes, weight and vitamin D deficiency.   BMI is Body mass index is 34.12 kg/m., she has not been working on diet and exercise. Wt Readings from Last  3 Encounters:  07/26/22 192 lb 9.6 oz (87.4 kg)  01/15/22 194 lb 12.8 oz (88.4 kg)  07/27/21 190 lb 3.2 oz (86.3 kg)    Her blood pressure has been controlled at home, She is currently on bisoprolol 5 mg and hyzaar 100/25 mg daily. Today their BP is BP: (!) 140/74  BP Readings from Last 3 Encounters:  07/26/22 (!) 140/74  01/15/22 128/74  12/10/21 (!) 152/78     She does not workout. She denies chest pain, shortness of breath, dizziness.   She is on cholesterol medication Atorvastatin and denies myalgias. Her cholesterol is at goal. The cholesterol last visit was:   Lab Results  Component Value Date   CHOL 120 01/15/2022   HDL 43 (L) 01/15/2022   LDLCALC 64 01/15/2022   LDLDIRECT 160.1 07/18/2009   TRIG 58 01/15/2022   CHOLHDL 2.8 01/15/2022    She has been working on diet and exercise for abnormal glucose Last A1C in the office was:  Lab Results  Component Value Date   HGBA1C 6.1 (H) 01/15/2022   Patient is on Vitamin D supplement.   Lab Results  Component Value Date   VD25OH 49 01/15/2022     She has been followed for low hemoglobin Lab Results  Component Value Date   WBC 7.5 02/01/2022   HGB 11.1 (L) 02/01/2022   HCT 35.3 02/01/2022   MCV 85.5 02/01/2022   PLT 257 02/01/2022     Lab Results  Component Value Date   IRON 61 06/09/2020   TIBC 400 06/09/2020   FERRITIN 4 (L) 06/09/2020      Current Medications:  Current Outpatient Medications on File Prior to Visit  Medication  Sig   acetaminophen (TYLENOL) 500 MG tablet Take 500 mg by mouth every 6 (six) hours as needed for mild pain or headache.    aspirin EC 81 MG tablet Take 81 mg by mouth daily.   atorvastatin (LIPITOR) 40 MG tablet Take  1 tablet  Daily  for Cholesterol   bisoprolol (ZEBETA) 5 MG tablet Take one tablet daily for blood pressure.   Cholecalciferol (VITAMIN D PO) Take 5,000 Units by mouth daily.   cyclobenzaprine (FLEXERIL) 10 MG tablet Take 0.5-1 tablets (5-10 mg total) by mouth 3 (three)  times daily as needed for muscle spasms.   losartan-hydrochlorothiazide (HYZAAR) 100-25 MG tablet Take  1 tablet  Daily  for BP / Patient knows to take by mouth   Magnesium 400 MG TABS Take 1 tablet by mouth daily.   Potassium Chloride ER 20 MEQ TBCR Take 1 tablet Daily for Potassium   No current facility-administered medications on file prior to visit.     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  HENT:  Negative for congestion and hearing loss.   Eyes:  Negative for blurred vision and double vision.  Respiratory:  Negative for cough and shortness of breath.   Cardiovascular:  Negative for chest pain, palpitations, orthopnea and leg swelling.  Gastrointestinal:  Negative for abdominal pain, constipation, diarrhea, heartburn, nausea and vomiting.  Musculoskeletal:  Negative for falls, joint pain and myalgias.  Skin:  Negative for rash.       Darkening of mole on left calf  Neurological:  Negative for dizziness, tingling, tremors, loss of consciousness and headaches.  Psychiatric/Behavioral:  Negative for depression, memory loss and suicidal ideas.       Physical Exam: BP (!) 140/74   Pulse 64   Temp 97.7 F (36.5 C)   Ht '5\' 3"'$  (1.6 m)   Wt 192 lb 9.6 oz (87.4 kg)   SpO2 98%   BMI 34.12 kg/m  Wt Readings from Last 3 Encounters:  07/26/22 192 lb 9.6 oz (87.4 kg)  01/15/22 194 lb 12.8 oz (88.4  kg)  07/27/21 190 lb 3.2 oz (86.3 kg)   General Appearance: Well nourished, in no apparent distress. Eyes: PERRLA, EOMs, conjunctiva no swelling or erythema Sinuses: No Frontal/maxillary tenderness ENT/Mouth: Ext aud canals clear, TMs without erythema, bulging. No erythema, swelling, or exudate on post pharynx.  Tonsils not swollen or erythematous. Hearing normal.  Neck: Supple, thyroid normal.  Respiratory: Respiratory effort normal, BS equal bilaterally without rales, rhonchi, wheezing or stridor.  Cardio: RRR with no MRGs. Brisk peripheral pulses without edema.  Abdomen: Soft, + BS.  Non tender, no guarding, rebound, hernias, masses. Lymphatics: Non tender without lymphadenopathy.  Musculoskeletal: Full ROM, 5/5 strength, Normal gait Skin: Warm, dry. Mole with darkening center approx 2 cm on left calf Neuro: Cranial nerves intact. No cerebellar symptoms.  Psych: Awake and oriented X 3, normal affect, Insight and Judgment appropriate.    Alycia Rossetti, NP 11:11 AM [facility]

## 2022-07-26 ENCOUNTER — Ambulatory Visit (INDEPENDENT_AMBULATORY_CARE_PROVIDER_SITE_OTHER): Payer: PPO | Admitting: Nurse Practitioner

## 2022-07-26 ENCOUNTER — Encounter: Payer: Self-pay | Admitting: Nurse Practitioner

## 2022-07-26 VITALS — BP 140/74 | HR 64 | Temp 97.7°F | Ht 63.0 in | Wt 192.6 lb

## 2022-07-26 DIAGNOSIS — L989 Disorder of the skin and subcutaneous tissue, unspecified: Secondary | ICD-10-CM | POA: Diagnosis not present

## 2022-07-26 DIAGNOSIS — I10 Essential (primary) hypertension: Secondary | ICD-10-CM

## 2022-07-26 DIAGNOSIS — Z79899 Other long term (current) drug therapy: Secondary | ICD-10-CM

## 2022-07-26 DIAGNOSIS — E559 Vitamin D deficiency, unspecified: Secondary | ICD-10-CM

## 2022-07-26 DIAGNOSIS — D649 Anemia, unspecified: Secondary | ICD-10-CM

## 2022-07-26 DIAGNOSIS — E782 Mixed hyperlipidemia: Secondary | ICD-10-CM

## 2022-07-26 DIAGNOSIS — N182 Chronic kidney disease, stage 2 (mild): Secondary | ICD-10-CM | POA: Diagnosis not present

## 2022-07-26 DIAGNOSIS — E669 Obesity, unspecified: Secondary | ICD-10-CM | POA: Diagnosis not present

## 2022-07-26 DIAGNOSIS — R7309 Other abnormal glucose: Secondary | ICD-10-CM | POA: Diagnosis not present

## 2022-07-26 MED ORDER — BISOPROLOL FUMARATE 5 MG PO TABS: ORAL_TABLET | ORAL | 1 refills | Status: DC

## 2022-07-26 MED ORDER — POTASSIUM CHLORIDE ER 20 MEQ PO TBCR: EXTENDED_RELEASE_TABLET | ORAL | 3 refills | Status: DC

## 2022-07-27 LAB — CBC WITH DIFFERENTIAL/PLATELET
Absolute Monocytes: 670 cells/uL (ref 200–950)
Basophils Absolute: 50 cells/uL (ref 0–200)
Basophils Relative: 0.7 %
Eosinophils Absolute: 137 cells/uL (ref 15–500)
Eosinophils Relative: 1.9 %
HCT: 36.5 % (ref 35.0–45.0)
Hemoglobin: 11.6 g/dL — ABNORMAL LOW (ref 11.7–15.5)
Lymphs Abs: 2290 cells/uL (ref 850–3900)
MCH: 26.4 pg — ABNORMAL LOW (ref 27.0–33.0)
MCHC: 31.8 g/dL — ABNORMAL LOW (ref 32.0–36.0)
MCV: 83.1 fL (ref 80.0–100.0)
MPV: 9.7 fL (ref 7.5–12.5)
Monocytes Relative: 9.3 %
Neutro Abs: 4054 cells/uL (ref 1500–7800)
Neutrophils Relative %: 56.3 %
Platelets: 255 10*3/uL (ref 140–400)
RBC: 4.39 10*6/uL (ref 3.80–5.10)
RDW: 13.2 % (ref 11.0–15.0)
Total Lymphocyte: 31.8 %
WBC: 7.2 10*3/uL (ref 3.8–10.8)

## 2022-07-27 LAB — IRON, TOTAL/TOTAL IRON BINDING CAP
%SAT: 32 % (calc) (ref 16–45)
Iron: 114 ug/dL (ref 45–160)
TIBC: 359 mcg/dL (calc) (ref 250–450)

## 2022-07-27 LAB — COMPLETE METABOLIC PANEL WITH GFR
AG Ratio: 1.3 (calc) (ref 1.0–2.5)
ALT: 10 U/L (ref 6–29)
AST: 15 U/L (ref 10–35)
Albumin: 4 g/dL (ref 3.6–5.1)
Alkaline phosphatase (APISO): 58 U/L (ref 37–153)
BUN: 9 mg/dL (ref 7–25)
CO2: 26 mmol/L (ref 20–32)
Calcium: 9.2 mg/dL (ref 8.6–10.4)
Chloride: 107 mmol/L (ref 98–110)
Creat: 0.84 mg/dL (ref 0.60–1.00)
Globulin: 3 g/dL (calc) (ref 1.9–3.7)
Glucose, Bld: 90 mg/dL (ref 65–99)
Potassium: 4.5 mmol/L (ref 3.5–5.3)
Sodium: 143 mmol/L (ref 135–146)
Total Bilirubin: 0.6 mg/dL (ref 0.2–1.2)
Total Protein: 7 g/dL (ref 6.1–8.1)
eGFR: 74 mL/min/{1.73_m2} (ref >=60)

## 2022-07-27 LAB — VITAMIN B12: Vitamin B-12: 491 pg/mL (ref 200–1100)

## 2022-07-27 LAB — FERRITIN: Ferritin: 10 ng/mL — ABNORMAL LOW (ref 16–288)

## 2022-07-27 LAB — LIPID PANEL
Cholesterol: 127 mg/dL (ref <200)
HDL: 45 mg/dL — ABNORMAL LOW (ref >=50)
LDL Cholesterol (Calc): 67 mg/dL (calc)
Non-HDL Cholesterol (Calc): 82 mg/dL (calc) (ref <130)
Total CHOL/HDL Ratio: 2.8 (calc) (ref <5.0)
Triglycerides: 71 mg/dL (ref <150)

## 2022-07-28 NOTE — Progress Notes (Signed)
LMOM FOR PATIENT TO CALL FOR LAB RESULTS. -E WELCH

## 2022-07-28 NOTE — Progress Notes (Signed)
Patient is aware of lab results and instructions. -e welch

## 2022-10-11 ENCOUNTER — Other Ambulatory Visit: Payer: Self-pay

## 2022-10-11 DIAGNOSIS — I10 Essential (primary) hypertension: Secondary | ICD-10-CM

## 2022-10-11 MED ORDER — LOSARTAN POTASSIUM-HCTZ 100-25 MG PO TABS: ORAL_TABLET | ORAL | 3 refills | Status: DC

## 2022-11-01 DIAGNOSIS — Z4689 Encounter for fitting and adjustment of other specified devices: Secondary | ICD-10-CM | POA: Diagnosis not present

## 2023-01-13 NOTE — Progress Notes (Signed)
Complete Physical  Assessment:    Encounter for General adult medical examination with abnormal findings 1 year Schedule mammogram in 02/2023  Essential hypertension - continue medications, DASH diet, exercise and monitor at home. Call if greater than 130/80.  -     CBC with Differential/Platelet -     CMP/GFR -     TSH  Mixed hyperlipidemia -continue medications, check lipids, decrease fatty foods, increase activity.  -     Lipid panel  Chronic kidney disease (CKD) stage 2/ history of nephrotic syndrome Increase fluids, avoid NSAIDS, monitor sugars, will monitor -     CMP/GFR  Sinus bradycardia on ECG Bisoprolol 5 mg  1/2 tab as needed with palpitations Monitor BP and pulse Go to the ER if any chest pain, shortness of breath, nausea, dizziness, severe HA, changes vision/speech   Medication management CBC, CMP/GFR, Magnesium   Vitamin D deficiency Continue supplement  Other abnormal glucose (prediabetes) Discussed disease and risks Discussed diet/exercise, weight management  Check A1C q64m CMP for glucose otherwise  Obesity with co morbidities Long discussion about weight loss, diet, and exercise Discussed ideal weight for height and initial weight goal (<180lb) Patient will work on cutting out sweet tea, processed carbohydrate and saturated fats Will follow up in 6 months  Screening for ischemic heart disease EKG  Screening for AAA - U/S ABD Retroperitoneal LTD  Screening for thyroid disorder - TSH  Screening for hematuria/proteinuria -UA with microscopic + reflex culture -Microalbumin/creatinine urine ratio   Over 30 minutes of exam, counseling, chart review, and critical decision making was performed Future Appointments  Date Time Provider [place] 01/18/2024  9:00 AM WAlycia Rossetti NP [facility] None     Subjective:   Lydia SCALISIis a 72y.o. female who presents for Medicare Annual Wellness Visit and 3 month follow up on  hypertension, prediabetes, hyperlipidemia, vitamin D def.   She works for [employer] working 2-3 days/week- plans to retire soon.  She fell at work on 12/03/21 and hurt right shoulder. Will occasionally get pain in right shoulder when at rest , very intermittent.  Does feel more fatigued.   She reports s/p hysterectomy, some bladder prolapse, she does wear a pessary through GYN office. She has noticed relief in symptoms with use of pessary  BMI is Body mass index is 34.4 kg/m., she is working on diet, no intentional exercise but physically active job. She is drinking sweet tea 24 ounces a day and one 12 ounce water.  Wt Readings from Last 3 Encounters:  01/17/23 194 lb 3.2 oz (88.1 kg)  07/26/22 192 lb 9.6 oz (87.4 kg)  01/15/22 194 lb 12.8 oz (88.4 kg)   Her blood pressure has been controlled at home , today their BP is BP: 138/74. She is currently taking Losartan/HCTZ 100/225mdaily and will use Bisoprolol 5 mg  with palpitations.  BP Readings from Last 3 Encounters:  01/17/23 138/74  07/26/22 (!) 140/74  01/15/22 128/74  She does workout.  She does a lot of walking.  She walks at least a mile daily when at work. She denies chest pain, shortness of breath, dizziness.   She is on cholesterol medication (lipitor 40 mg daily) and denies myalgias. Her cholesterol is at goal. The cholesterol last visit was:   Lab Results  Component Value Date   CHOL 127 07/26/2022   HDL 45 (L) 07/26/2022   LDLCALC 67 07/26/2022   LDLDIRECT 160.1 07/18/2009   TRIG 71 07/26/2022  CHOLHDL 2.8 07/26/2022   She has been working on diet and exercise for prediabetes, SHE HAS BEEN DRINKING SWEET TEA, drinks 3 glasses a day, and denies foot ulcerations, hyperglycemia, hypoglycemia , increased appetite, nausea, paresthesia of the feet, polydipsia, polyuria, visual disturbances, vomiting and weight loss.  Last A1C in the office was:  Lab Results  Component Value Date   HGBA1C 6.1 (H) 01/15/2022   Patient is on Vitamin  D supplement, at goal recent check:  Lab Results  Component Value Date   VD25OH 20 01/15/2022     She has a history of nephrotic syndrome, saw Dr. Lorrene Reid for that, has been discharged and we are still monitoring kidney functions.  Lab Results  Component Value Date   GFRAA 82 04/13/2021     Medication Review   Current Outpatient Medications (Cardiovascular):    atorvastatin (LIPITOR) 40 MG tablet, Take  1 tablet  Daily  for Cholesterol   bisoprolol (ZEBETA) 5 MG tablet, Take one tablet daily for blood pressure.   losartan-hydrochlorothiazide (HYZAAR) 100-25 MG tablet, Take  1 tablet  Daily  for BP / Patient knows to take by mouth   Current Outpatient Medications (Analgesics):    acetaminophen (TYLENOL) 500 MG tablet, Take 500 mg by mouth every 6 (six) hours as needed for mild pain or headache.    aspirin EC 81 MG tablet, Take 81 mg by mouth daily.   Current Outpatient Medications (Other):    Cholecalciferol (VITAMIN D PO), Take 5,000 Units by mouth daily.   cyclobenzaprine (FLEXERIL) 10 MG tablet, Take 0.5-1 tablets (5-10 mg total) by mouth 3 (three) times daily as needed for muscle spasms.   Magnesium 400 MG TABS, Take 1 tablet by mouth daily.   Potassium Chloride ER 20 MEQ TBCR, Take 1 tablet Daily for Potassium  Current Problems (verified) Patient Active Problem List   Diagnosis Date Noted   Female genital prolapse 04/13/2021   Obesity (BMI 30.0-34.9) 10/18/2019   FHx: heart disease 08/28/2018   Other abnormal glucose (prediabetes) 10/25/2016   Chronic kidney disease (CKD) stage 2 07/19/2016   Encounter for Medicare annual wellness exam 10/06/2015   Medication management 09/02/2014   Hyperlipidemia    Hypertension    Vitamin D deficiency     Screening Tests Immunization History  Administered Date(s) Administered   Influenza, High Dose Seasonal PF 08/28/2018, 10/22/2019, 11/05/2020   Influenza,inj,quad, With Preservative 10/25/2016   Influenza,trivalent,  recombinat, inj, PF 09/07/2015   Influenza-Unspecified 08/30/2015   PFIZER(Purple Top)SARS-COV-2 Vaccination 12/24/2019, 01/14/2020, 09/02/2020   Pneumococcal Conjugate-13 01/30/2018   Pneumococcal Polysaccharide-23 10/29/2015   Td 02/19/2000    Health Maintenance  Topic Date Due   Zoster Vaccines- Shingrix (1 of 2) Never done   DTaP/Tdap/Td (2 - Tdap) 02/18/2010   Medicare Annual Wellness (AWV)  04/13/2022   INFLUENZA VACCINE  06/29/2022   COVID-19 Vaccine (4 - 2023-24 season) 07/30/2022   MAMMOGRAM  03/22/2024   COLONOSCOPY (Pts 45-41yr Insurance coverage will need to be confirmed)  09/09/2025   Pneumonia Vaccine 72 Years old  Completed   DEXA SCAN  Completed   Hepatitis C Screening  Completed   HPV VACCINES  Aged Out     Names of Other Physician/Practitioners you currently use: 1. [facility]- here for primary care 2. Dr. GKaty Fitch eye doctor, 2021, has scheduled in June 3. [facility], last visit 2018, DUE  Patient Care Team: MUnk Pinto MD as PCP - General (Internal Medicine) WVania Rea MD as  Consulting Physician (Obstetrics and Gynecology) Lafayette Dragon, MD (Inactive) as Consulting Physician (Gastroenterology)  Allergies Allergies  Allergen Reactions   Ace Inhibitors    Naproxen     REACTION: renal failure   Norvasc [Amlodipine Besylate]     edema   Nsaids     SURGICAL HISTORY She  has a past surgical history that includes Abdominal hysterectomy and Eye surgery (Left, 2017). FAMILY HISTORY Her family history includes Cancer in her sister; Diabetes in her sister; Heart attack in her brother; Hypertension in her sister and sister; Kidney disease in her brother and sister; Stroke in her sister.  Heart attack brother 77 SOCIAL HISTORY She  reports that she has never smoked. She has never used smokeless tobacco. She reports that she does not drink alcohol and does not use drugs.   Review of Systems   Constitutional:  Negative for chills, fever, malaise/fatigue and weight loss.  HENT:  Negative for congestion, ear pain, hearing loss, sinus pain, sore throat and tinnitus.   Eyes: Negative.  Negative for blurred vision and double vision.  Respiratory:  Negative for cough, hemoptysis, sputum production, shortness of breath and wheezing.   Cardiovascular:  Positive for palpitations (intermittently treated with bisoprolol). Negative for chest pain and leg swelling.  Gastrointestinal:  Negative for abdominal pain, blood in stool, constipation, diarrhea, heartburn, melena, nausea and vomiting.  Genitourinary:  Negative for dysuria, frequency, hematuria and urgency.  Musculoskeletal:  Positive for joint pain (right shoulder). Negative for back pain, falls, myalgias and neck pain.  Skin: Negative.  Negative for rash.       Large skin tag on back  Neurological:  Negative for dizziness, tingling, tremors, sensory change, loss of consciousness, weakness and headaches.  Endo/Heme/Allergies:  Negative for environmental allergies. Does not bruise/bleed easily.  Psychiatric/Behavioral:  Negative for depression and suicidal ideas. The patient is not nervous/anxious and does not have insomnia.     Objective:   Today's Vitals   01/17/23 0900  BP: 138/74  Pulse: 63  Temp: 98.1 F (36.7 C)  SpO2: 98%  Weight: 194 lb 3.2 oz (88.1 kg)  Height: 5' 3"$  (1.6 m)   Body mass index is 34.4 kg/m.  General appearance: alert, no distress, WD/WN,  female HEENT: normocephalic, sclerae anicteric, TMs pearly, nares patent, no discharge or erythema, pharynx normal Oral cavity: MMM, no lesions Neck: supple, no lymphadenopathy, no thyromegaly, no masses Heart: RRR, normal S1, S2, no murmurs Lungs: CTA bilaterally, no wheezes, rhonchi, or rales Abdomen: +bs, soft, non tender, non distended, no masses, no hepatomegaly, no splenomegaly Musculoskeletal: nontender, no swelling, no obvious deformity.  Decreased ROM  right shoulder Extremities: no edema, no cyanosis, no clubbing Pulses: 2+ symmetric, upper and lower extremities, normal cap refill Neurological: alert, oriented x 3, CN2-12 intact, strength normal upper extremities and lower extremities, sensation normal throughout, DTRs 2+ throughout, no cerebellar signs, gait normal Psychiatric: normal affect, behavior normal, pleasant  Breast: defer to GYN Pelvic: Defer to GYN  EKG: Sinus bradycardia, No ST changes AAA: < 3 cm    Alycia Rossetti, NP   01/17/2023

## 2023-01-17 ENCOUNTER — Ambulatory Visit (INDEPENDENT_AMBULATORY_CARE_PROVIDER_SITE_OTHER): Payer: PPO | Admitting: Nurse Practitioner

## 2023-01-17 ENCOUNTER — Encounter: Payer: Self-pay | Admitting: Nurse Practitioner

## 2023-01-17 VITALS — BP 138/74 | HR 63 | Temp 98.1°F | Ht 63.0 in | Wt 194.2 lb

## 2023-01-17 DIAGNOSIS — I700 Atherosclerosis of aorta: Secondary | ICD-10-CM | POA: Diagnosis not present

## 2023-01-17 DIAGNOSIS — R7309 Other abnormal glucose: Secondary | ICD-10-CM | POA: Diagnosis not present

## 2023-01-17 DIAGNOSIS — Z0000 Encounter for general adult medical examination without abnormal findings: Secondary | ICD-10-CM | POA: Diagnosis not present

## 2023-01-17 DIAGNOSIS — I10 Essential (primary) hypertension: Secondary | ICD-10-CM

## 2023-01-17 DIAGNOSIS — Z79899 Other long term (current) drug therapy: Secondary | ICD-10-CM | POA: Diagnosis not present

## 2023-01-17 DIAGNOSIS — Z136 Encounter for screening for cardiovascular disorders: Secondary | ICD-10-CM

## 2023-01-17 DIAGNOSIS — Z1329 Encounter for screening for other suspected endocrine disorder: Secondary | ICD-10-CM | POA: Diagnosis not present

## 2023-01-17 DIAGNOSIS — E559 Vitamin D deficiency, unspecified: Secondary | ICD-10-CM | POA: Diagnosis not present

## 2023-01-17 DIAGNOSIS — N182 Chronic kidney disease, stage 2 (mild): Secondary | ICD-10-CM

## 2023-01-17 DIAGNOSIS — Z1389 Encounter for screening for other disorder: Secondary | ICD-10-CM | POA: Diagnosis not present

## 2023-01-17 DIAGNOSIS — Z0001 Encounter for general adult medical examination with abnormal findings: Secondary | ICD-10-CM

## 2023-01-17 DIAGNOSIS — E66811 Obesity, class 1: Secondary | ICD-10-CM

## 2023-01-17 DIAGNOSIS — E782 Mixed hyperlipidemia: Secondary | ICD-10-CM

## 2023-01-17 DIAGNOSIS — E669 Obesity, unspecified: Secondary | ICD-10-CM

## 2023-01-17 NOTE — Patient Instructions (Signed)

## 2023-01-18 NOTE — Progress Notes (Signed)
Patient is aware of lab results and instructions. -e welch

## 2023-01-19 ENCOUNTER — Other Ambulatory Visit: Payer: Self-pay | Admitting: Nurse Practitioner

## 2023-01-19 DIAGNOSIS — N3090 Cystitis, unspecified without hematuria: Secondary | ICD-10-CM

## 2023-01-19 MED ORDER — NITROFURANTOIN MONOHYD MACRO 100 MG PO CAPS: 100.0000 mg | ORAL_CAPSULE | Freq: Two times a day (BID) | ORAL | 0 refills | Status: AC

## 2023-01-19 NOTE — Progress Notes (Signed)
Patient is aware of lab results and says that she will pick up and start the [place]. -e welch

## 2023-01-20 LAB — CBC WITH DIFFERENTIAL/PLATELET
Absolute Monocytes: 756 cells/uL (ref 200–950)
Basophils Absolute: 79 cells/uL (ref 0–200)
Basophils Relative: 1.1 %
Eosinophils Absolute: 252 cells/uL (ref 15–500)
Eosinophils Relative: 3.5 %
HCT: 38.8 % (ref 35.0–45.0)
Hemoglobin: 12.5 g/dL (ref 11.7–15.5)
Lymphs Abs: 2297 cells/uL (ref 850–3900)
MCH: 27.3 pg (ref 27.0–33.0)
MCHC: 32.2 g/dL (ref 32.0–36.0)
MCV: 84.7 fL (ref 80.0–100.0)
MPV: 9.9 fL (ref 7.5–12.5)
Monocytes Relative: 10.5 %
Neutro Abs: 3816 cells/uL (ref 1500–7800)
Neutrophils Relative %: 53 %
Platelets: 263 10*3/uL (ref 140–400)
RBC: 4.58 10*6/uL (ref 3.80–5.10)
RDW: 12.7 % (ref 11.0–15.0)
Total Lymphocyte: 31.9 %
WBC: 7.2 10*3/uL (ref 3.8–10.8)

## 2023-01-20 LAB — MICROALBUMIN / CREATININE URINE RATIO
Creatinine, Urine: 93 mg/dL (ref 20–275)
Microalb Creat Ratio: 2 mcg/mg creat (ref <30)
Microalb, Ur: 0.2 mg/dL

## 2023-01-20 LAB — URINALYSIS W MICROSCOPIC + REFLEX CULTURE
Bilirubin Urine: NEGATIVE
Glucose, UA: NEGATIVE
Hgb urine dipstick: NEGATIVE
Hyaline Cast: NONE SEEN /LPF
Ketones, ur: NEGATIVE
Nitrites, Initial: NEGATIVE
Protein, ur: NEGATIVE
RBC / HPF: NONE SEEN /HPF (ref 0–2)
Specific Gravity, Urine: 1.012 (ref 1.001–1.035)
Squamous Epithelial / HPF: NONE SEEN /HPF (ref <=5)
pH: 6 (ref 5.0–8.0)

## 2023-01-20 LAB — MAGNESIUM: Magnesium: 1.9 mg/dL (ref 1.5–2.5)

## 2023-01-20 LAB — COMPLETE METABOLIC PANEL WITH GFR
AG Ratio: 1.4 (calc) (ref 1.0–2.5)
ALT: 11 U/L (ref 6–29)
AST: 16 U/L (ref 10–35)
Albumin: 4.1 g/dL (ref 3.6–5.1)
Alkaline phosphatase (APISO): 58 U/L (ref 37–153)
BUN: 11 mg/dL (ref 7–25)
CO2: 30 mmol/L (ref 20–32)
Calcium: 9.3 mg/dL (ref 8.6–10.4)
Chloride: 106 mmol/L (ref 98–110)
Creat: 0.88 mg/dL (ref 0.60–1.00)
Globulin: 2.9 g/dL (calc) (ref 1.9–3.7)
Glucose, Bld: 97 mg/dL (ref 65–99)
Potassium: 4.3 mmol/L (ref 3.5–5.3)
Sodium: 145 mmol/L (ref 135–146)
Total Bilirubin: 0.5 mg/dL (ref 0.2–1.2)
Total Protein: 7 g/dL (ref 6.1–8.1)
eGFR: 70 mL/min/{1.73_m2} (ref >=60)

## 2023-01-20 LAB — URINE CULTURE
MICRO NUMBER:: 14585224
SPECIMEN QUALITY:: ADEQUATE

## 2023-01-20 LAB — LIPID PANEL
Cholesterol: 127 mg/dL (ref <200)
HDL: 43 mg/dL — ABNORMAL LOW (ref >=50)
LDL Cholesterol (Calc): 68 mg/dL (calc)
Non-HDL Cholesterol (Calc): 84 mg/dL (calc) (ref <130)
Total CHOL/HDL Ratio: 3 (calc) (ref <5.0)
Triglycerides: 78 mg/dL (ref <150)

## 2023-01-20 LAB — CULTURE INDICATED

## 2023-01-20 LAB — TSH: TSH: 1.75 mIU/L (ref 0.40–4.50)

## 2023-01-20 LAB — VITAMIN D 25 HYDROXY (VIT D DEFICIENCY, FRACTURES): Vit D, 25-Hydroxy: 56 ng/mL (ref 30–100)

## 2023-01-20 LAB — HEMOGLOBIN A1C
Hgb A1c MFr Bld: 6.3 % of total Hgb — ABNORMAL HIGH (ref <5.7)
Mean Plasma Glucose: 134 mg/dL
eAG (mmol/L): 7.4 mmol/L

## 2023-01-25 NOTE — Progress Notes (Signed)
Assessment and Plan:  Lydia Edwards was seen today for procedure.  Diagnoses and all orders for this visit:  Essential hypertension - continue medications: Bisoprolol 5 mg QD and Hyzaar 100/25 mg 1 tab QD - Strongly encouraged to watch salt in diet and increase water intake, monitor BP and if persistently running greater than 130/80 notify the office  ST (skin tag) Area was anesthetized with 1.5 ml of 1% lidocaine and skin tag was snipped off using Betadine for cleansing and sterile iris scissors. Local anesthesia was used. Silver nitrate was used to stop bleeding, antibacterial cream applied with gauze and covered with tegaderm. This pathognomonic lesion was not sent for pathology.   Advised to leave in place x 3 days and then remove If develops any s/s of infections notify the office otherwise return in 2 weeks for recheck    Further disposition pending results of labs. Discussed med's effects and SE's.   Over 30 minutes of exam, counseling, chart review, and critical decision making was performed.   Future Appointments  Date Time Provider Department Center  07/18/2023  9:30 AM Raynelle Dick, NP [facility] None  01/18/2024  9:00 AM Raynelle Dick, NP [facility] None    ------------------------------------------------------------------------------------------------------------------   HPI BP (!) 150/70   Pulse 62   Temp (!) 97.5 F (36.4 C)   Ht 5\' 3"  (1.6 m)   Wt 193 lb 3.2 oz (87.6 kg)   SpO2 97%   BMI 34.22 kg/m   72 y.o.female presents for hypertension and removal of skin lesion on her back . She has a skin tag mid back that does cause irritation because it is in bra line. She would like to have this removed    She is currently on Hyzaar 100/25 mg daily and bisoprolol 5 mg daily. She did eat a very salty meal last night . 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    aspirin EC 81 MG tablet Take 81 mg by mouth daily.   atorvastatin (LIPITOR) 40 MG tablet Take  1 tablet  Daily  for Cholesterol   bisoprolol (ZEBETA) 5 MG tablet Take one tablet daily for blood pressure.   Cholecalciferol (VITAMIN D PO) Take 5,000 Units by mouth daily.   cyclobenzaprine (FLEXERIL) 10 MG tablet Take 0.5-1 tablets (5-10 mg total) by mouth 3 (three) times daily as needed for muscle spasms.   losartan-hydrochlorothiazide (HYZAAR) 100-25 MG tablet Take  1 tablet  Daily  for BP / Patient knows to take by mouth   Magnesium 400 MG TABS Take 1 tablet by mouth daily.   nitrofurantoin, macrocrystal-monohydrate, (MACROBID) 100 MG capsule Take 1 capsule (100 mg total) by  mouth 2 (two) times daily for 7 days.   Potassium Chloride ER 20 MEQ TBCR Take 1 tablet Daily for Potassium   No current facility-administered medications on file prior to visit.    ROS: all negative except above.   Physical Exam:  BP (!) 150/70   Pulse 62   Temp (!) 97.5 F (36.4 C)   Ht 5\' 3"  (1.6 m)   Wt 193 lb 3.2 oz (87.6 kg)   SpO2 97%   BMI 34.22 kg/m   General Appearance: Well nourished, in no apparent distress. Eyes: PERRLA, EOMs, conjunctiva no swelling or erythema Sinuses: No Frontal/maxillary tenderness ENT/Mouth: Ext aud canals clear, TMs without erythema, bulging. No erythema,  swelling, or exudate on post pharynx.  Tonsils not swollen or erythematous. Hearing normal.  Neck: Supple, thyroid normal.  Respiratory: Respiratory effort normal, BS equal bilaterally without rales, rhonchi, wheezing or stridor.  Cardio: RRR with no MRGs. Brisk peripheral pulses without edema.  Abdomen: Soft, + BS.  Non tender, no guarding, rebound, hernias, masses. Lymphatics: Non tender without lymphadenopathy.  Musculoskeletal: Full ROM, 5/5 strength, normal gait.  Skin: Warm, dry , 2 cm skin tag of mid back Neuro: Cranial nerves intact. Normal muscle tone, no cerebellar symptoms. Sensation intact.  Psych: Awake and oriented X 3, normal affect, Insight and Judgment appropriate.     S: The patient complains of symptomatic skin tags on the mid back at bra line. These are irritated by clothing, jewelry and rubbing.  O: Patient appears well. Large  benign skin tags are noted mid thoracic area of back   A: Skin tag  P: Skin tags are snipped off using Betadine for cleansing and sterile iris scissors. Local anesthesia was used. Silver nitrate was used to stop bleeding, This pathognomonic lesion was not sent for pathology.   Raynelle Dick, NP 12:09 PM [facility]

## 2023-01-26 ENCOUNTER — Ambulatory Visit (INDEPENDENT_AMBULATORY_CARE_PROVIDER_SITE_OTHER): Payer: PPO | Admitting: Nurse Practitioner

## 2023-01-26 ENCOUNTER — Encounter: Payer: Self-pay | Admitting: Nurse Practitioner

## 2023-01-26 VITALS — BP 150/70 | HR 62 | Temp 97.5°F | Ht 63.0 in | Wt 193.2 lb

## 2023-01-26 DIAGNOSIS — I10 Essential (primary) hypertension: Secondary | ICD-10-CM

## 2023-01-26 DIAGNOSIS — L918 Other hypertrophic disorders of the skin: Secondary | ICD-10-CM

## 2023-01-26 NOTE — Patient Instructions (Signed)
Skin tag removal   There is a dressing in place, leave in place x 3 days then remove If area develops and redness or drains any discharge please notify the office  Return in 2 weeks for reheck

## 2023-02-10 DIAGNOSIS — Z6834 Body mass index (BMI) 34.0-34.9, adult: Secondary | ICD-10-CM | POA: Diagnosis not present

## 2023-02-10 DIAGNOSIS — Z01419 Encounter for gynecological examination (general) (routine) without abnormal findings: Secondary | ICD-10-CM | POA: Diagnosis not present

## 2023-02-10 NOTE — Progress Notes (Signed)
Assessment and Plan:  Lydia Edwards was seen today for follow-up.  Diagnoses and all orders for this visit:  Essential hypertension - continue medications, DASH diet, exercise and monitor at home. Call if greater than 130/80.   ST (skin tag) Area is healing well, small scab noted If develops redness, drainage or bleeding from area notify the office Continue to monitor      Further disposition pending results of labs. Discussed med's effects and SE's.   Over 30 minutes of exam, counseling, chart review, and critical decision making was performed.   Future Appointments  Date Time Provider [place]  07/18/2023  9:30 AM Alycia Rossetti, NP [facility] None  01/18/2024  9:00 AM Alycia Rossetti, NP [facility] None    ------------------------------------------------------------------------------------------------------------------   HPI BP 136/84   Pulse 75   Temp 98 F (36.7 C)   Resp 16   Ht 5\' 3"  (1.6 m)   Wt 191 lb 12.8 oz (87 kg)   SpO2 97%   BMI 33.98 kg/m   72 y.o.female presents for reevaluation of skin tag removal. She had large skin tag removed from thoracic area of back 2 weeks ago. Denies any drainage, irritation or redness.   BP is currently well controlled on Hyzaar 100/25 mg QD and bisoprolol 5 mg QD. Denies headaches, chest pain, shortness of breath and dizziness BP Readings from Last 3 Encounters:  02/11/23 136/84  01/26/23 (!) 150/70  01/17/23 138/74   BMI is Body mass index is 33.98 kg/m., she has been working on diet and exercise. 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   atorvastatin (LIPITOR) 40 MG tablet Take  1 tablet  Daily  for Cholesterol   bisoprolol (ZEBETA) 5 MG tablet Take one tablet daily for blood pressure.   Cholecalciferol (VITAMIN D PO) Take 5,000 Units by mouth daily.   cyclobenzaprine (FLEXERIL) 10 MG tablet Take 0.5-1 tablets (5-10 mg total) by mouth 3 (three) times daily as needed for muscle spasms.   losartan-hydrochlorothiazide (HYZAAR) 100-25 MG tablet Take  1 tablet  Daily  for BP / Patient knows to take by mouth   Magnesium 400 MG TABS Take 1 tablet by mouth daily.   Potassium Chloride ER 20 MEQ TBCR Take 1 tablet Daily for Potassium   acetaminophen (TYLENOL) 500 MG tablet Take 500 mg by mouth every 6 (six) hours as needed for mild pain or headache.    No current facility-administered medications on file prior to visit.    ROS: all negative except above.   Physical Exam:  BP 136/84   Pulse 75   Temp 98 F (36.7 C)   Resp 16   Ht 5\' 3"  (1.6 m)  Wt 191 lb 12.8 oz (87 kg)   SpO2 97%   BMI 33.98 kg/m   General Appearance: Well nourished, in no apparent distress. Eyes: PERRLA, EOMs, conjunctiva no swelling or erythema Respiratory: Respiratory effort normal, BS equal bilaterally without rales, rhonchi, wheezing or stridor.  Cardio: RRR with no MRGs. Brisk peripheral pulses without edema.  Abdomen: Soft, + BS.  Non tender, no guarding, rebound, hernias, masses. Musculoskeletal: Full ROM, 5/5 strength, normal gait.  Skin: Warm, dry without rashes, lesions, ecchymosis. Skin tag removal area left thoracic area of back, healing well- no erythema or drainage, small scab noted Neuro: Cranial nerves intact. Normal muscle tone, no cerebellar symptoms. Sensation  intact.  Psych: Awake and oriented X 3, normal affect, Insight and Judgment appropriate.     Alycia Rossetti, NP 11:33 AM [facility]

## 2023-02-11 ENCOUNTER — Encounter: Payer: Self-pay | Admitting: Nurse Practitioner

## 2023-02-11 ENCOUNTER — Ambulatory Visit (INDEPENDENT_AMBULATORY_CARE_PROVIDER_SITE_OTHER): Payer: PPO | Admitting: Nurse Practitioner

## 2023-02-11 VITALS — BP 136/84 | HR 75 | Temp 98.0°F | Resp 16 | Ht 63.0 in | Wt 191.8 lb

## 2023-02-11 DIAGNOSIS — L918 Other hypertrophic disorders of the skin: Secondary | ICD-10-CM

## 2023-02-11 DIAGNOSIS — I10 Essential (primary) hypertension: Secondary | ICD-10-CM | POA: Diagnosis not present

## 2023-02-11 IMAGING — MG MM DIGITAL SCREENING BILAT W/ TOMO AND CAD
8 series · 8 of 24 positions shown · non-contrast
Comparison: Previous exam(s).

CLINICAL DATA: Screening.

EXAM:
DIGITAL SCREENING BILATERAL MAMMOGRAM WITH TOMOSYNTHESIS AND CAD
TECHNIQUE: Bilateral screening digital craniocaudal and mediolateral oblique
mammograms were obtained. Bilateral screening digital breast
tomosynthesis was performed. The images were evaluated with
computer-aided detection.

[R CC synth-2D]
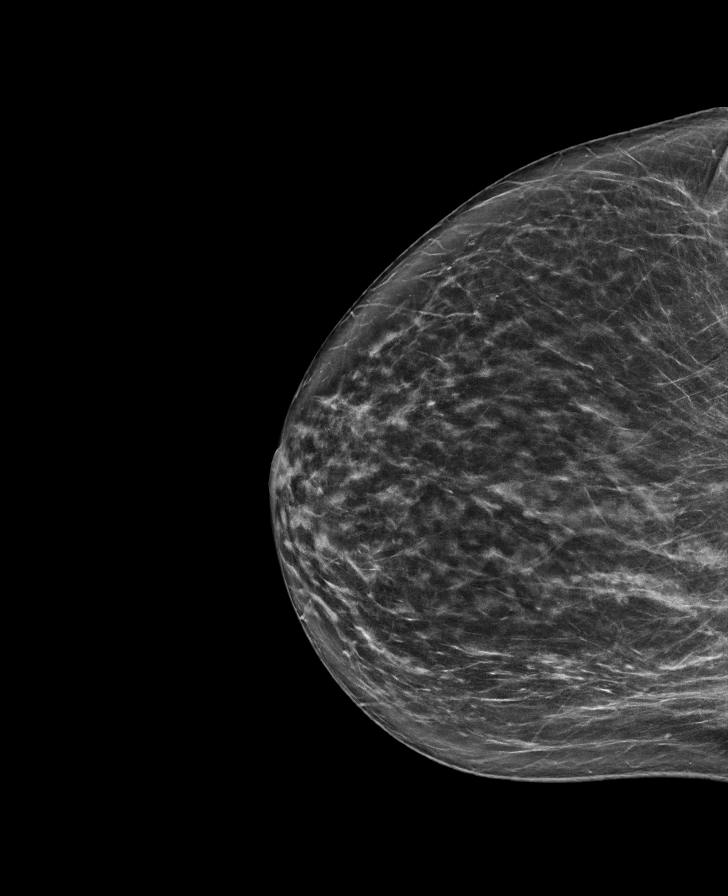

[L MLO synth-2D]
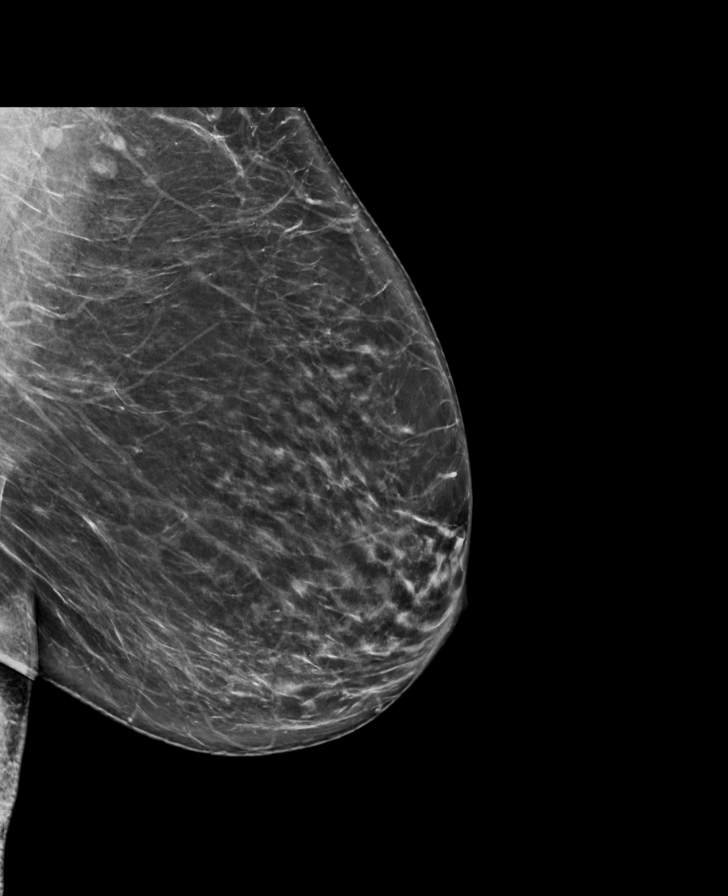

[L CC synth-2D]
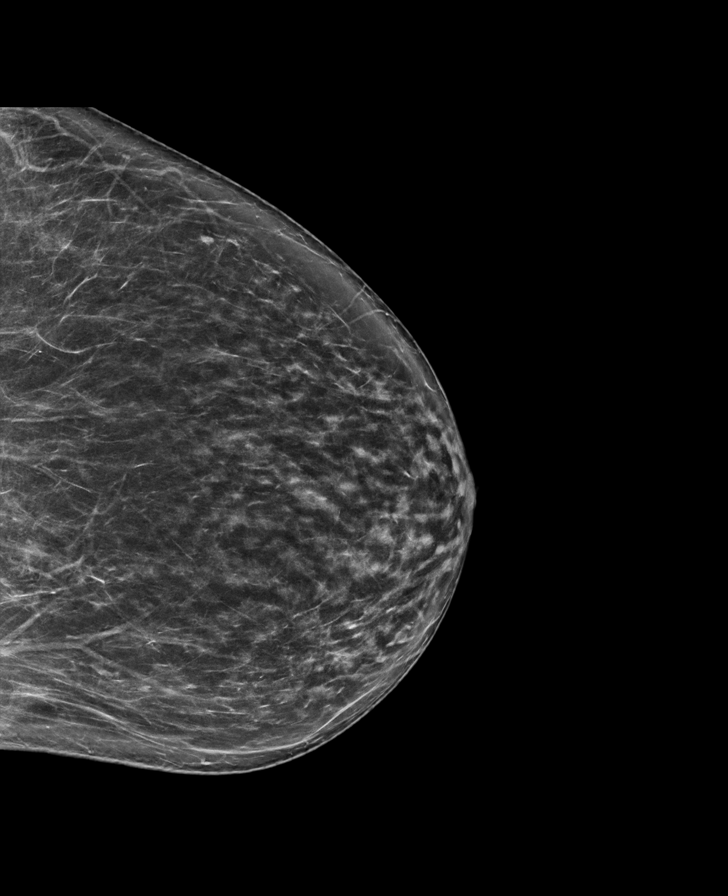

[R MLO synth-2D]
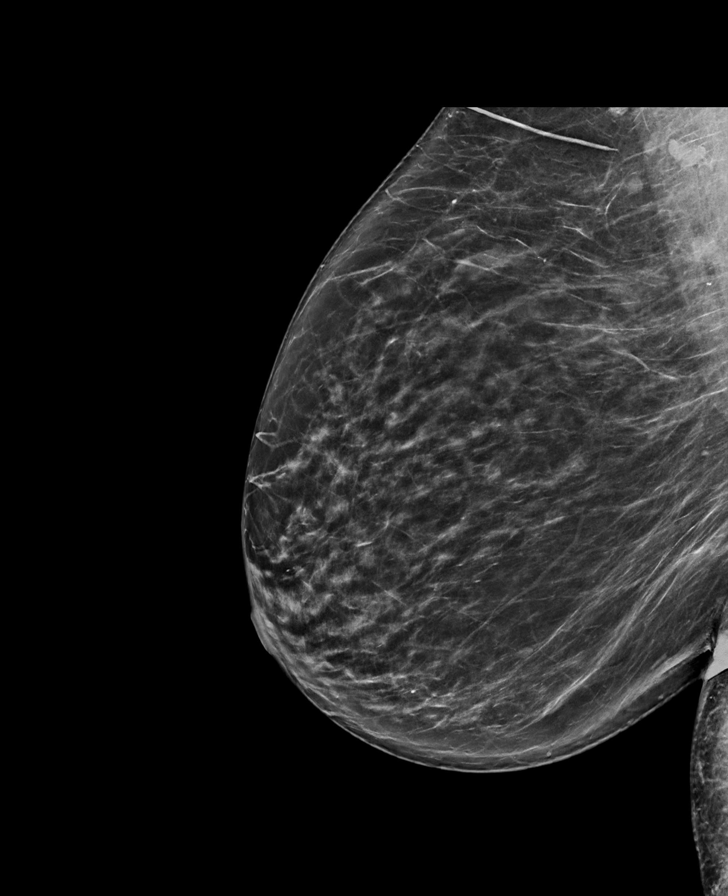

[R CC tomo · tomo slice 39/76.0]
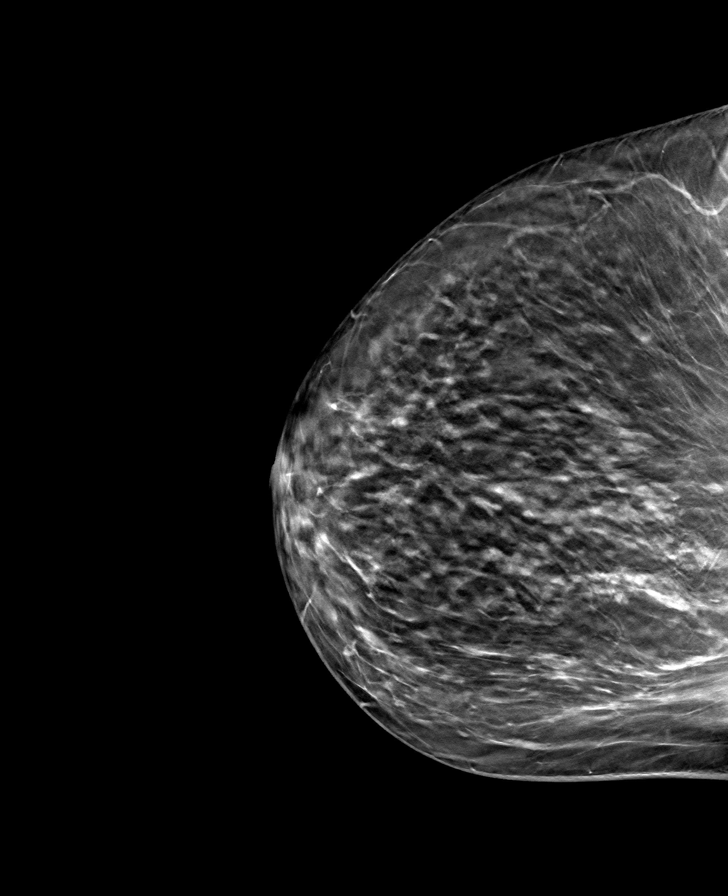

[L CC tomo · tomo slice 38/75.0]
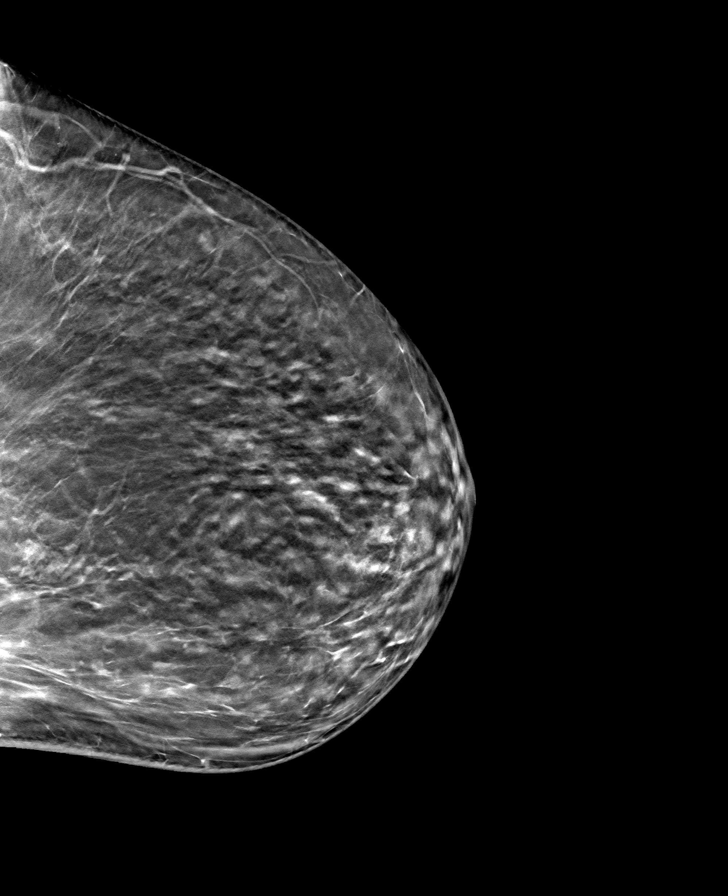

[L MLO tomo · tomo slice 41/80.0]
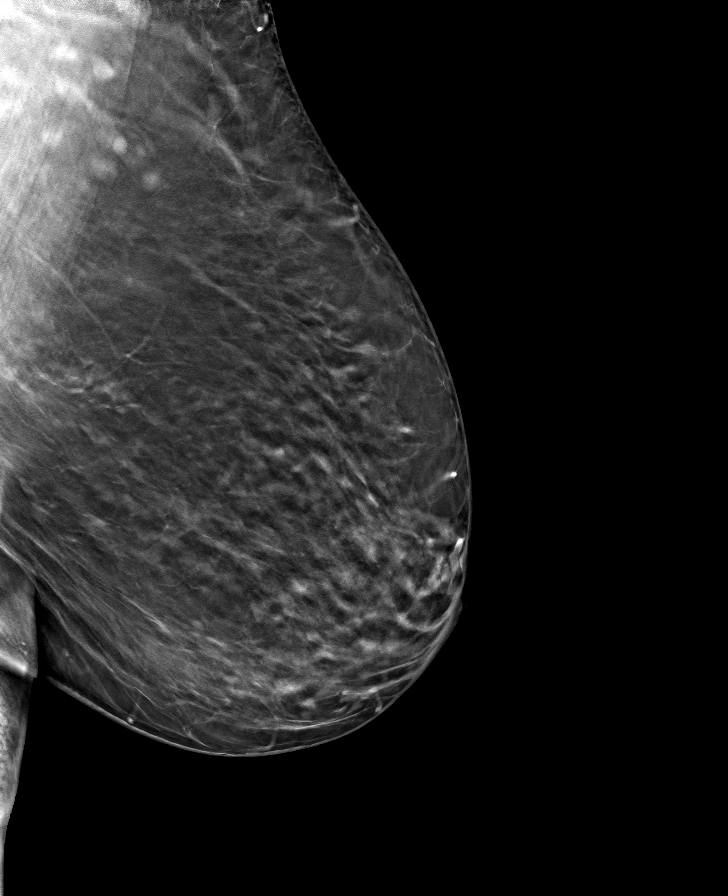

[R MLO tomo · tomo slice 40/79.0]
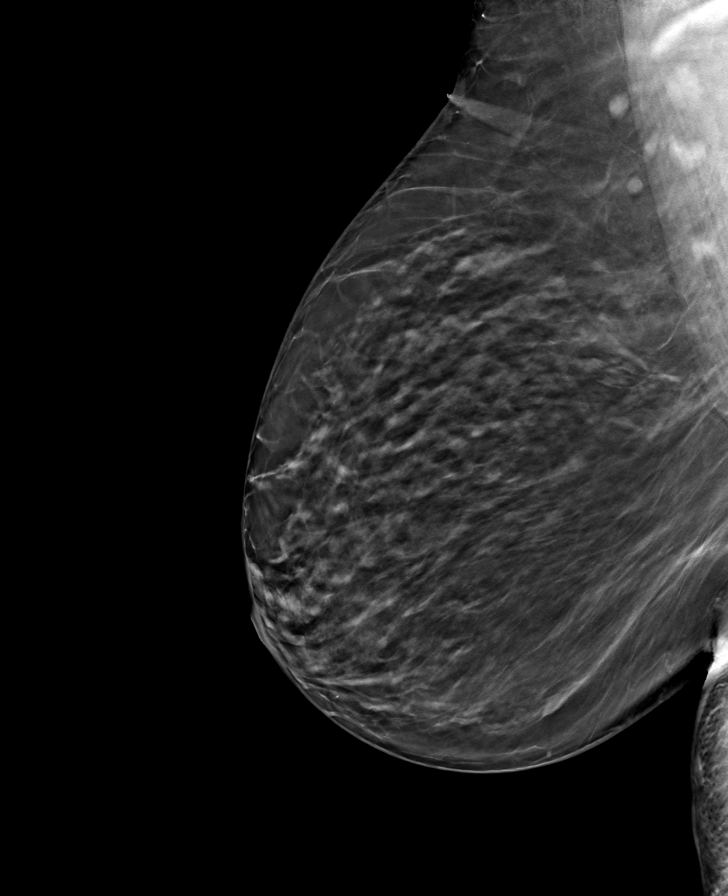

[8 of 24 positions shown; findings below may reference images not displayed]

ACR Breast Density Category c: The breast tissue is heterogeneously
dense, which may obscure small masses.
FINDINGS: There are no findings suspicious for malignancy.
IMPRESSION: No mammographic evidence of malignancy. A result letter of this
screening mammogram will be mailed directly to the patient.

RECOMMENDATION:
Screening mammogram in one year. (Code:Q3-W-BC3)

BI-RADS CATEGORY  1: Negative.

## 2023-02-11 NOTE — Patient Instructions (Signed)

## 2023-02-21 ENCOUNTER — Other Ambulatory Visit: Payer: Self-pay | Admitting: Internal Medicine

## 2023-02-21 DIAGNOSIS — Z0000 Encounter for general adult medical examination without abnormal findings: Secondary | ICD-10-CM

## 2023-03-21 ENCOUNTER — Telehealth: Payer: Self-pay | Admitting: Nurse Practitioner

## 2023-03-21 ENCOUNTER — Other Ambulatory Visit: Payer: Self-pay | Admitting: Nurse Practitioner

## 2023-03-21 DIAGNOSIS — I10 Essential (primary) hypertension: Secondary | ICD-10-CM

## 2023-03-21 DIAGNOSIS — M25511 Pain in right shoulder: Secondary | ICD-10-CM

## 2023-03-21 MED ORDER — CYCLOBENZAPRINE HCL 10 MG PO TABS: 5.0000 mg | ORAL_TABLET | Freq: Three times a day (TID) | ORAL | 1 refills | Status: DC | PRN

## 2023-03-21 MED ORDER — LOSARTAN POTASSIUM-HCTZ 100-25 MG PO TABS: ORAL_TABLET | ORAL | 3 refills | Status: DC

## 2023-03-21 NOTE — Telephone Encounter (Signed)
Patient is requesting a refill on Losartan and Cyclobenzaprine to [facility] on [address]

## 2023-04-06 ENCOUNTER — Ambulatory Visit
Admission: RE | Admit: 2023-04-06 | Discharge: 2023-04-06 | Disposition: A | Discharge status: Home / Self Care | Payer: PPO | Source: Ambulatory Visit | Attending: Internal Medicine | Admitting: Internal Medicine

## 2023-04-06 DIAGNOSIS — Z1231 Encounter for screening mammogram for malignant neoplasm of breast: Secondary | ICD-10-CM | POA: Diagnosis not present

## 2023-04-06 DIAGNOSIS — Z0000 Encounter for general adult medical examination without abnormal findings: Secondary | ICD-10-CM

## 2023-05-06 ENCOUNTER — Other Ambulatory Visit: Payer: Self-pay | Admitting: Nurse Practitioner

## 2023-05-06 ENCOUNTER — Telehealth: Payer: Self-pay | Admitting: Nurse Practitioner

## 2023-05-06 DIAGNOSIS — E782 Mixed hyperlipidemia: Secondary | ICD-10-CM

## 2023-05-06 MED ORDER — POTASSIUM CHLORIDE ER 20 MEQ PO TBCR: EXTENDED_RELEASE_TABLET | ORAL | 3 refills | Status: DC

## 2023-05-06 MED ORDER — ATORVASTATIN CALCIUM 40 MG PO TABS
ORAL_TABLET | ORAL | 3 refills | Status: DC
Start: 2023-05-06 — End: 2024-07-17

## 2023-05-06 NOTE — Telephone Encounter (Signed)
Patient is requesting refills on Atorvastatin and Potassium to [facility] on [address].

## 2023-05-17 ENCOUNTER — Telehealth: Payer: Self-pay | Admitting: Nurse Practitioner

## 2023-05-17 DIAGNOSIS — I10 Essential (primary) hypertension: Secondary | ICD-10-CM

## 2023-05-17 NOTE — Telephone Encounter (Signed)
Requesting refill on Bisoprolol. Pls send to [facility] on [address].

## 2023-05-18 ENCOUNTER — Other Ambulatory Visit: Payer: Self-pay | Admitting: Nurse Practitioner

## 2023-05-18 DIAGNOSIS — I10 Essential (primary) hypertension: Secondary | ICD-10-CM

## 2023-05-18 MED ORDER — BISOPROLOL FUMARATE 5 MG PO TABS
ORAL_TABLET | ORAL | 3 refills | Status: DC
Start: 2023-05-18 — End: 2024-10-22

## 2023-05-18 MED ORDER — BISOPROLOL FUMARATE 5 MG PO TABS
ORAL_TABLET | ORAL | 3 refills | Status: DC
Start: 2023-05-18 — End: 2023-05-18

## 2023-05-18 NOTE — Addendum Note (Signed)
Addended by: Dionicio Stall on: 05/18/2023 09:03 AM   Modules accepted: Orders

## 2023-07-14 DIAGNOSIS — H0102A Squamous blepharitis right eye, upper and lower eyelids: Secondary | ICD-10-CM | POA: Diagnosis not present

## 2023-07-14 DIAGNOSIS — G510 Bell's palsy: Secondary | ICD-10-CM | POA: Diagnosis not present

## 2023-07-14 DIAGNOSIS — Z961 Presence of intraocular lens: Secondary | ICD-10-CM | POA: Diagnosis not present

## 2023-07-14 DIAGNOSIS — H0102B Squamous blepharitis left eye, upper and lower eyelids: Secondary | ICD-10-CM | POA: Diagnosis not present

## 2023-07-14 DIAGNOSIS — H40013 Open angle with borderline findings, low risk, bilateral: Secondary | ICD-10-CM | POA: Diagnosis not present

## 2023-07-15 NOTE — Progress Notes (Unsigned)
FOLLOW UP  Assessment and Plan:   Hypertension Well controlled with current medications : Bisoprolol 5 mg and Hyzaar 100/25 mg QD Monitor blood pressure at home; patient to call if consistently greater than 130/80 Continue DASH diet.   Reminder to go to the ER if any CP, SOB, nausea, dizziness, severe HA, changes vision/speech, left arm numbness and tingling and jaw pain. - CBC - CMP  Cholesterol Currently at goal;  Continue low cholesterol diet and exercise.  Check lipid panel.   Abnormal Glucose Continue medication: Continue diet and exercise.  Perform daily foot/skin check, notify office of any concerning changes.  - A1c  Obesity with co morbidities Long discussion about weight loss, diet, and exercise Recommended diet heavy in fruits and veggies and low in animal meats, cheeses, and dairy products, appropriate calorie intake Will follow up in 3 months - TSH  CKD stage 2 Increase fluids, avoid NSAIDS, monitor sugars, will monitor  -CBC, CMP  Vitamin D Def At goal at last visit; continue supplementation to maintain goal of 60-100 Defer Vit D level  Medication Management Magnesium TSH    Continue diet and meds as discussed. Further disposition pending results of labs. Discussed med's effects and SE's.   Over 30 minutes of exam, counseling, chart review, and critical decision making was performed.   Future Appointments  Date Time Provider Department Center  07/18/2023  9:30 AM Raynelle Dick, NP [facility] None  01/18/2024  9:00 AM Raynelle Dick, NP [facility] None    ----------------------------------------------------------------------------------------------------------------------  HPI 72 y.o. female  presents for 3 month follow up on hypertension, cholesterol, diabetes, weight and vitamin D deficiency.   BMI is There is no height or weight on file to calculate BMI., she has not been working on diet and exercise. Wt Readings from Last 3 Encounters:   02/11/23 191 lb 12.8 oz (87 kg)  01/26/23 193 lb 3.2 oz (87.6 kg)  01/17/23 194 lb 3.2 oz (88.1 kg)    Her blood pressure has been controlled at home, She is currently on bisoprolol 5 mg and hyzaar 100/25 mg daily. Today their BP is    BP Readings from Last 3 Encounters:  02/11/23 136/84  01/26/23 (!) 150/70  01/17/23 138/74     She does not workout. She denies chest pain, shortness of breath, dizziness.   She is on cholesterol medication Atorvastatin and denies myalgias. Her cholesterol is at goal. The cholesterol last visit was:   Lab Results  Component Value Date   CHOL 127 01/17/2023   HDL 43 (L) 01/17/2023   LDLCALC 68 01/17/2023   LDLDIRECT 160.1 07/18/2009   TRIG 78 01/17/2023   CHOLHDL 3.0 01/17/2023    She has been working on diet and exercise for abnormal glucose Last A1C in the office was:  Lab Results  Component Value Date   HGBA1C 6.3 (H) 01/17/2023   Patient is on Vitamin D supplement.   Lab Results  Component Value Date   VD25OH 56 01/17/2023     She has been followed for low hemoglobin Lab Results  Component Value Date   WBC 7.2 01/17/2023   HGB 12.5 01/17/2023   HCT 38.8 01/17/2023   MCV 84.7 01/17/2023   PLT 263 01/17/2023     Lab Results  Component Value Date   IRON 114 07/26/2022   TIBC 359 07/26/2022   FERRITIN 10 (L) 07/26/2022      Current Medications:  Current Outpatient Medications on File Prior to Visit  Medication Sig  acetaminophen (TYLENOL) 500 MG tablet Take 500 mg by mouth every 6 (six) hours as needed for mild pain or headache.    aspirin EC 81 MG tablet Take 81 mg by mouth daily.   atorvastatin (LIPITOR) 40 MG tablet Take  1 tablet  Daily  for Cholesterol   bisoprolol (ZEBETA) 5 MG tablet Take one tablet daily for blood pressure.   Cholecalciferol (VITAMIN D PO) Take 5,000 Units by mouth daily.   cyclobenzaprine (FLEXERIL) 10 MG tablet Take 0.5-1 tablets (5-10 mg total) by mouth 3 (three) times daily as needed for muscle  spasms.   losartan-hydrochlorothiazide (HYZAAR) 100-25 MG tablet Take  1 tablet  Daily  for BP / Patient knows to take by mouth   Magnesium 400 MG TABS Take 1 tablet by mouth daily.   Potassium Chloride ER 20 MEQ TBCR Take 1 tablet Daily for Potassium   No current facility-administered medications on file prior to visit.     Allergies:  Allergies  Allergen Reactions   Ace Inhibitors    Naproxen     REACTION: renal failure   Norvasc [Amlodipine Besylate]     edema   Nsaids      Medical History:  Past Medical History:  Diagnosis Date   ARF (acute renal failure) (HCC) 10/28/2015   CAP (community acquired pneumonia) 11/12/2015   Hyperlipidemia    Hypertension    Prediabetes    SYNDROME, NEPHROTIC W/MINIMAL CHANGE LESION 07/12/2007   Vitamin D deficiency    Family history- Reviewed and unchanged Social history- Reviewed and unchanged   Review of Systems:  Review of Systems  Constitutional:  Negative for chills, fever and weight loss.  HENT:  Negative for congestion and hearing loss.   Eyes:  Negative for blurred vision and double vision.  Respiratory:  Negative for cough and shortness of breath.   Cardiovascular:  Negative for chest pain, palpitations, orthopnea and leg swelling.  Gastrointestinal:  Negative for abdominal pain, constipation, diarrhea, heartburn, nausea and vomiting.  Musculoskeletal:  Negative for falls, joint pain and myalgias.  Skin:  Negative for rash.       Darkening of mole on left calf  Neurological:  Negative for dizziness, tingling, tremors, loss of consciousness and headaches.  Psychiatric/Behavioral:  Negative for depression, memory loss and suicidal ideas.       Physical Exam: There were no vitals taken for this visit. Wt Readings from Last 3 Encounters:  02/11/23 191 lb 12.8 oz (87 kg)  01/26/23 193 lb 3.2 oz (87.6 kg)  01/17/23 194 lb 3.2 oz (88.1 kg)   General Appearance: Well nourished, in no apparent distress. Eyes: PERRLA, EOMs,  conjunctiva no swelling or erythema Sinuses: No Frontal/maxillary tenderness ENT/Mouth: Ext aud canals clear, TMs without erythema, bulging. No erythema, swelling, or exudate on post pharynx.  Tonsils not swollen or erythematous. Hearing normal.  Neck: Supple, thyroid normal.  Respiratory: Respiratory effort normal, BS equal bilaterally without rales, rhonchi, wheezing or stridor.  Cardio: RRR with no MRGs. Brisk peripheral pulses without edema.  Abdomen: Soft, + BS.  Non tender, no guarding, rebound, hernias, masses. Lymphatics: Non tender without lymphadenopathy.  Musculoskeletal: Full ROM, 5/5 strength, Normal gait Skin: Warm, dry. Mole with darkening center approx 2 cm on left calf Neuro: Cranial nerves intact. No cerebellar symptoms.  Psych: Awake and oriented X 3, normal affect, Insight and Judgment appropriate.    Raynelle Dick, NP 8:44 AM [facility]

## 2023-07-18 ENCOUNTER — Ambulatory Visit (INDEPENDENT_AMBULATORY_CARE_PROVIDER_SITE_OTHER): Payer: PPO | Admitting: Nurse Practitioner

## 2023-07-18 ENCOUNTER — Encounter: Payer: Self-pay | Admitting: Nurse Practitioner

## 2023-07-18 VITALS — BP 104/68 | HR 56 | Temp 97.7°F | Ht 63.0 in | Wt 192.8 lb

## 2023-07-18 DIAGNOSIS — I10 Essential (primary) hypertension: Secondary | ICD-10-CM

## 2023-07-18 DIAGNOSIS — E6609 Other obesity due to excess calories: Secondary | ICD-10-CM | POA: Diagnosis not present

## 2023-07-18 DIAGNOSIS — R7309 Other abnormal glucose: Secondary | ICD-10-CM

## 2023-07-18 DIAGNOSIS — Z79899 Other long term (current) drug therapy: Secondary | ICD-10-CM | POA: Diagnosis not present

## 2023-07-18 DIAGNOSIS — E559 Vitamin D deficiency, unspecified: Secondary | ICD-10-CM | POA: Diagnosis not present

## 2023-07-18 DIAGNOSIS — N182 Chronic kidney disease, stage 2 (mild): Secondary | ICD-10-CM

## 2023-07-18 DIAGNOSIS — Z6834 Body mass index (BMI) 34.0-34.9, adult: Secondary | ICD-10-CM

## 2023-07-18 DIAGNOSIS — E782 Mixed hyperlipidemia: Secondary | ICD-10-CM

## 2023-07-18 DIAGNOSIS — E669 Obesity, unspecified: Secondary | ICD-10-CM

## 2023-07-18 NOTE — Patient Instructions (Signed)

## 2023-07-19 LAB — COMPLETE METABOLIC PANEL WITH GFR
AG Ratio: 1.3 (calc) (ref 1.0–2.5)
ALT: 12 U/L (ref 6–29)
AST: 16 U/L (ref 10–35)
Albumin: 4 g/dL (ref 3.6–5.1)
Alkaline phosphatase (APISO): 55 U/L (ref 37–153)
BUN: 15 mg/dL (ref 7–25)
CO2: 29 mmol/L (ref 20–32)
Calcium: 9 mg/dL (ref 8.6–10.4)
Chloride: 104 mmol/L (ref 98–110)
Creat: 0.84 mg/dL (ref 0.60–1.00)
Globulin: 3.1 g/dL (ref 1.9–3.7)
Glucose, Bld: 106 mg/dL — ABNORMAL HIGH (ref 65–99)
Potassium: 4.3 mmol/L (ref 3.5–5.3)
Sodium: 141 mmol/L (ref 135–146)
Total Bilirubin: 0.4 mg/dL (ref 0.2–1.2)
Total Protein: 7.1 g/dL (ref 6.1–8.1)
eGFR: 73 mL/min/{1.73_m2} (ref >=60)

## 2023-07-19 LAB — CBC WITH DIFFERENTIAL/PLATELET
Absolute Monocytes: 697 {cells}/uL (ref 200–950)
Basophils Absolute: 74 cells/uL (ref 0–200)
Basophils Relative: 0.9 %
Eosinophils Absolute: 189 {cells}/uL (ref 15–500)
Eosinophils Relative: 2.3 %
HCT: 37.1 % (ref 35.0–45.0)
Hemoglobin: 11.6 g/dL — ABNORMAL LOW (ref 11.7–15.5)
Lymphs Abs: 2788 {cells}/uL (ref 850–3900)
MCH: 26 pg — ABNORMAL LOW (ref 27.0–33.0)
MCHC: 31.3 g/dL — ABNORMAL LOW (ref 32.0–36.0)
MCV: 83 fL (ref 80.0–100.0)
MPV: 9.6 fL (ref 7.5–12.5)
Monocytes Relative: 8.5 %
Neutro Abs: 4453 {cells}/uL (ref 1500–7800)
Neutrophils Relative %: 54.3 %
Platelets: 293 10*3/uL (ref 140–400)
RBC: 4.47 10*6/uL (ref 3.80–5.10)
RDW: 13.5 % (ref 11.0–15.0)
Total Lymphocyte: 34 %
WBC: 8.2 10*3/uL (ref 3.8–10.8)

## 2023-07-19 LAB — LIPID PANEL
Cholesterol: 105 mg/dL (ref <200)
HDL: 40 mg/dL — ABNORMAL LOW (ref >=50)
LDL Cholesterol (Calc): 51 mg/dL
Non-HDL Cholesterol (Calc): 65 mg/dL (ref <130)
Total CHOL/HDL Ratio: 2.6 (calc) (ref <5.0)
Triglycerides: 64 mg/dL (ref <150)

## 2023-07-19 LAB — TSH: TSH: 1.23 mIU/L (ref 0.40–4.50)

## 2023-07-19 LAB — HEMOGLOBIN A1C W/OUT EAG: Hgb A1c MFr Bld: 6.6 %{Hb} — ABNORMAL HIGH (ref <5.7)

## 2023-07-19 LAB — MAGNESIUM: Magnesium: 1.8 mg/dL (ref 1.5–2.5)

## 2023-08-08 DIAGNOSIS — Z4689 Encounter for fitting and adjustment of other specified devices: Secondary | ICD-10-CM | POA: Diagnosis not present

## 2023-10-21 NOTE — Progress Notes (Unsigned)
FOLLOW UP  Assessment and Plan:   Essential Hypertension Well controlled with current medications : Bisoprolol 5 mg as needed for palpitations and Hyzaar 100/25 mg every day  Monitor blood pressure at home; patient to call if consistently greater than 130/80 Continue DASH diet.   Reminder to go to the ER if any CP, SOB, nausea, dizziness, severe HA, changes vision/speech, left arm numbness and tingling and jaw pain. - CBC - CMP  Hyperlipidemia Currently at goal; Continue Atorvastatin 40 mg every day  Continue low cholesterol diet and exercise.  - lipid panel.   Abnormal Glucose Currently controlled with diet and exercise.  Perform daily foot/skin check, notify office of any concerning changes.  - A1c  Obesity with co morbidities- BMI 34 with HTN, HLD and abnormal glucose Long discussion about weight loss, diet, and exercise Recommended diet heavy in fruits and veggies and low in animal meats, cheeses, and dairy products, appropriate calorie intake Will follow up in 3 months   CKD stage 2 Increase fluids, avoid NSAIDS, monitor sugars, will monitor  -CBC, CMP  Vitamin D Def At goal at last visit; continue supplementation to maintain goal of 60-100 Defer Vit D level  Medication Management Magnesium     Continue diet and meds as discussed. Further disposition pending results of labs. Discussed med's effects and SE's.   Over 30 minutes of exam, counseling, chart review, and critical decision making was performed.   Future Appointments  Date Time Provider Department Center  10/24/2023  9:30 AM Raynelle Dick, NP [facility] None  01/18/2024  9:00 AM Raynelle Dick, NP [facility] None    ----------------------------------------------------------------------------------------------------------------------  HPI 72 y.o. female  presents for 3 month follow up on hypertension, cholesterol, diabetes, weight and vitamin D deficiency.   She had a sore throat Friday and  then developed a dry cough. She is now feeling a lot better  BMI is There is no height or weight on file to calculate BMI., she has not been working on diet and exercise. She has been limiting sugar in her diet.She is trying to drink lots of water Wt Readings from Last 3 Encounters:  07/18/23 192 lb 12.8 oz (87.5 kg)  02/11/23 191 lb 12.8 oz (87 kg)  01/26/23 193 lb 3.2 oz (87.6 kg)    Her blood pressure has been controlled at home, She is currently on bisoprolol 5 mg as needed for palpitations- only having to use 1-2 times a month and hyzaar 100/25 mg daily. Today their BP is    BP Readings from Last 3 Encounters:  07/18/23 104/68  02/11/23 136/84  01/26/23 (!) 150/70  She does not workout. She denies chest pain, shortness of breath, dizziness.   She is on cholesterol medication Atorvastatin 40 mg every day and denies myalgias. Her cholesterol is at goal. The cholesterol last visit was:   Lab Results  Component Value Date   CHOL 105 07/18/2023   HDL 40 (L) 07/18/2023   LDLCALC 51 07/18/2023   LDLDIRECT 160.1 07/18/2009   TRIG 64 07/18/2023   CHOLHDL 2.6 07/18/2023    She has been working on diet and exercise for abnormal glucose Last A1C in the office was:  Lab Results  Component Value Date   HGBA1C 6.6 (H) 07/18/2023   Patient is on Vitamin D supplement.   Lab Results  Component Value Date   VD25OH 77 01/17/2023     She has been followed for low hemoglobin Lab Results  Component Value Date  WBC 8.2 07/18/2023   HGB 11.6 (L) 07/18/2023   HCT 37.1 07/18/2023   MCV 83.0 07/18/2023   PLT 293 07/18/2023     Lab Results  Component Value Date   IRON 114 07/26/2022   TIBC 359 07/26/2022   FERRITIN 10 (L) 07/26/2022      Current Medications:  Current Outpatient Medications on File Prior to Visit  Medication Sig   acetaminophen (TYLENOL) 500 MG tablet Take 500 mg by mouth every 6 (six) hours as needed for mild pain or headache.    aspirin EC 81 MG tablet Take 81 mg  by mouth daily.   atorvastatin (LIPITOR) 40 MG tablet Take  1 tablet  Daily  for Cholesterol   bisoprolol (ZEBETA) 5 MG tablet Take one tablet daily for blood pressure.   Cholecalciferol (VITAMIN D PO) Take 5,000 Units by mouth daily.   cyclobenzaprine (FLEXERIL) 10 MG tablet Take 0.5-1 tablets (5-10 mg total) by mouth 3 (three) times daily as needed for muscle spasms.   losartan-hydrochlorothiazide (HYZAAR) 100-25 MG tablet Take  1 tablet  Daily  for BP / Patient knows to take by mouth   Magnesium 400 MG TABS Take 1 tablet by mouth daily.   Potassium Chloride ER 20 MEQ TBCR Take 1 tablet Daily for Potassium   No current facility-administered medications on file prior to visit.     Allergies:  Allergies  Allergen Reactions   Ace Inhibitors    Naproxen     REACTION: renal failure   Norvasc [Amlodipine Besylate]     edema   Nsaids      Medical History:  Past Medical History:  Diagnosis Date   ARF (acute renal failure) (HCC) 10/28/2015   CAP (community acquired pneumonia) 11/12/2015   Hyperlipidemia    Hypertension    Prediabetes    SYNDROME, NEPHROTIC W/MINIMAL CHANGE LESION 07/12/2007   Vitamin D deficiency    Family history- Reviewed and unchanged Social history- Reviewed and unchanged   Review of Systems:  Review of Systems  Constitutional:  Negative for chills, fever and weight loss.  HENT:  Negative for congestion and hearing loss.   Eyes:  Negative for blurred vision and double vision.  Respiratory:  Positive for cough (dry). Negative for shortness of breath.   Cardiovascular:  Negative for chest pain, palpitations, orthopnea and leg swelling.  Gastrointestinal:  Negative for abdominal pain, constipation, diarrhea, heartburn, nausea and vomiting.  Musculoskeletal:  Positive for joint pain (right shoulder). Negative for falls and myalgias.  Skin:  Negative for rash.  Neurological:  Negative for dizziness, tingling, tremors, loss of consciousness and headaches.   Psychiatric/Behavioral:  Negative for depression, memory loss and suicidal ideas.       Physical Exam: There were no vitals taken for this visit. Wt Readings from Last 3 Encounters:  07/18/23 192 lb 12.8 oz (87.5 kg)  02/11/23 191 lb 12.8 oz (87 kg)  01/26/23 193 lb 3.2 oz (87.6 kg)   General Appearance: Well nourished, in no apparent distress. Eyes: PERRLA, EOMs, conjunctiva no swelling or erythema Sinuses: No Frontal/maxillary tenderness ENT/Mouth: Ext aud canals clear, TMs without erythema, bulging. No erythema, swelling, or exudate on post pharynx.  Hearing normal.  Neck: Supple, thyroid normal.  Respiratory: Respiratory effort normal, BS equal bilaterally without rales, rhonchi, wheezing or stridor.  Cardio: RRR with no MRGs. Brisk peripheral pulses without edema.  Abdomen: Soft, + BS.  Non tender, no guarding, rebound, hernias, masses. Lymphatics: Non tender without lymphadenopathy.  Musculoskeletal: Full ROM,  5/5 strength, Normal gait Skin: Warm, dry. Neuro: Cranial nerves intact. No cerebellar symptoms.  Psych: Awake and oriented X 3, normal affect, Insight and Judgment appropriate.    Raynelle Dick, NP 10:48 AM [facility]

## 2023-10-24 ENCOUNTER — Ambulatory Visit (INDEPENDENT_AMBULATORY_CARE_PROVIDER_SITE_OTHER): Payer: PPO | Admitting: Nurse Practitioner

## 2023-10-24 ENCOUNTER — Encounter: Payer: Self-pay | Admitting: Nurse Practitioner

## 2023-10-24 VITALS — BP 142/78 | HR 61 | Temp 97.7°F | Ht 63.0 in | Wt 189.4 lb

## 2023-10-24 DIAGNOSIS — E66811 Obesity, class 1: Secondary | ICD-10-CM

## 2023-10-24 DIAGNOSIS — N182 Chronic kidney disease, stage 2 (mild): Secondary | ICD-10-CM

## 2023-10-24 DIAGNOSIS — Z23 Encounter for immunization: Secondary | ICD-10-CM

## 2023-10-24 DIAGNOSIS — E782 Mixed hyperlipidemia: Secondary | ICD-10-CM | POA: Diagnosis not present

## 2023-10-24 DIAGNOSIS — I10 Essential (primary) hypertension: Secondary | ICD-10-CM

## 2023-10-24 DIAGNOSIS — Z6833 Body mass index (BMI) 33.0-33.9, adult: Secondary | ICD-10-CM | POA: Diagnosis not present

## 2023-10-24 DIAGNOSIS — E6609 Other obesity due to excess calories: Secondary | ICD-10-CM | POA: Diagnosis not present

## 2023-10-24 DIAGNOSIS — E559 Vitamin D deficiency, unspecified: Secondary | ICD-10-CM

## 2023-10-24 DIAGNOSIS — Z79899 Other long term (current) drug therapy: Secondary | ICD-10-CM

## 2023-10-24 DIAGNOSIS — M25562 Pain in left knee: Secondary | ICD-10-CM | POA: Diagnosis not present

## 2023-10-24 DIAGNOSIS — R7309 Other abnormal glucose: Secondary | ICD-10-CM | POA: Diagnosis not present

## 2023-10-24 NOTE — Patient Instructions (Signed)

## 2023-10-25 LAB — COMPLETE METABOLIC PANEL WITH GFR
AG Ratio: 1.3 (calc) (ref 1.0–2.5)
ALT: 12 U/L (ref 6–29)
AST: 16 U/L (ref 10–35)
Albumin: 4 g/dL (ref 3.6–5.1)
Alkaline phosphatase (APISO): 49 U/L (ref 37–153)
BUN: 11 mg/dL (ref 7–25)
CO2: 30 mmol/L (ref 20–32)
Calcium: 9.1 mg/dL (ref 8.6–10.4)
Chloride: 105 mmol/L (ref 98–110)
Creat: 0.8 mg/dL (ref 0.60–1.00)
Globulin: 3 g/dL (ref 1.9–3.7)
Glucose, Bld: 102 mg/dL — ABNORMAL HIGH (ref 65–99)
Potassium: 4.4 mmol/L (ref 3.5–5.3)
Sodium: 142 mmol/L (ref 135–146)
Total Bilirubin: 0.6 mg/dL (ref 0.2–1.2)
Total Protein: 7 g/dL (ref 6.1–8.1)
eGFR: 78 mL/min/{1.73_m2} (ref >=60)

## 2023-10-25 LAB — LIPID PANEL
Cholesterol: 130 mg/dL (ref <200)
HDL: 44 mg/dL — ABNORMAL LOW (ref >=50)
LDL Cholesterol (Calc): 71 mg/dL
Non-HDL Cholesterol (Calc): 86 mg/dL (ref <130)
Total CHOL/HDL Ratio: 3 (calc) (ref <5.0)
Triglycerides: 74 mg/dL (ref <150)

## 2023-10-25 LAB — CBC WITH DIFFERENTIAL/PLATELET
Absolute Lymphocytes: 2353 {cells}/uL (ref 850–3900)
Absolute Monocytes: 600 {cells}/uL (ref 200–950)
Basophils Absolute: 69 {cells}/uL (ref 0–200)
Basophils Relative: 1 %
Eosinophils Absolute: 138 {cells}/uL (ref 15–500)
Eosinophils Relative: 2 %
HCT: 36.8 % (ref 35.0–45.0)
Hemoglobin: 11.4 g/dL — ABNORMAL LOW (ref 11.7–15.5)
MCH: 26.5 pg — ABNORMAL LOW (ref 27.0–33.0)
MCHC: 31 g/dL — ABNORMAL LOW (ref 32.0–36.0)
MCV: 85.4 fL (ref 80.0–100.0)
MPV: 9.9 fL (ref 7.5–12.5)
Monocytes Relative: 8.7 %
Neutro Abs: 3740 {cells}/uL (ref 1500–7800)
Neutrophils Relative %: 54.2 %
Platelets: 239 10*3/uL (ref 140–400)
RBC: 4.31 10*6/uL (ref 3.80–5.10)
RDW: 13.3 % (ref 11.0–15.0)
Total Lymphocyte: 34.1 %
WBC: 6.9 10*3/uL (ref 3.8–10.8)

## 2023-10-25 LAB — MAGNESIUM: Magnesium: 2 mg/dL (ref 1.5–2.5)

## 2023-10-26 NOTE — Progress Notes (Signed)
Patient is aware of lab results and instructions. -e welch

## 2024-01-17 ENCOUNTER — Encounter (HOSPITAL_COMMUNITY): Payer: Self-pay

## 2024-01-17 ENCOUNTER — Ambulatory Visit (HOSPITAL_COMMUNITY)
Admission: EM | Admit: 2024-01-17 | Discharge: 2024-01-17 | Disposition: A | Discharge status: Home / Self Care | Payer: PPO | Attending: Family Medicine | Admitting: Family Medicine

## 2024-01-17 DIAGNOSIS — K122 Cellulitis and abscess of mouth: Secondary | ICD-10-CM

## 2024-01-17 MED ORDER — AMOXICILLIN-POT CLAVULANATE 875-125 MG PO TABS: 1.0000 | ORAL_TABLET | Freq: Two times a day (BID) | ORAL | 0 refills | Status: DC

## 2024-01-17 NOTE — Discharge Instructions (Signed)
 You were seen today for an abscess under your tongue.  I have sent out an antibiotic to treat infection.  Please use tylenol or motrin for pain and swelling.  If this does not improve, then follow up with a dentist.  If this worsens then please go to the ER for further evaluation.

## 2024-01-17 NOTE — ED Provider Notes (Signed)
 [facility]    CSN: [id number] Arrival date & time: 01/17/24  1017      History   Chief Complaint Chief Complaint  Patient presents with   Dental Pain    HPI Lydia Edwards is a 73 y.o. female.    Dental Pain  Patient is here for pain and swelling to the right lower jaw/teeth area.  Feels like there are blisters in her mouth.  She does not have any teeth in that area of discomfort.  Maybe having hot flashes, but no known fevers.  No n/v.  Today is actually better than previous days.  She thinks there is something draining in her mouth.        Past Medical History:  Diagnosis Date   ARF (acute renal failure) (HCC) 10/28/2015   CAP (community acquired pneumonia) 11/12/2015   Hyperlipidemia    Hypertension    Prediabetes    SYNDROME, NEPHROTIC W/MINIMAL CHANGE LESION 07/12/2007   Vitamin D deficiency     Patient Active Problem List   Diagnosis Date Noted   Female genital prolapse 04/13/2021   Obesity (BMI 30.0-34.9) 10/18/2019   FHx: heart disease 08/28/2018   Other abnormal glucose (prediabetes) 10/25/2016   Chronic kidney disease (CKD) stage 2 07/19/2016   Encounter for Medicare annual wellness exam 10/06/2015   Medication management 09/02/2014   Hyperlipidemia    Hypertension    Vitamin D deficiency     Past Surgical History:  Procedure Laterality Date   ABDOMINAL HYSTERECTOMY     EYE SURGERY Left 2017   Cataract sugery left eye    OB History   No obstetric history on file.      Home Medications    Prior to Admission medications   Medication Sig Start Date End Date Taking? Authorizing Provider  acetaminophen (TYLENOL) 500 MG tablet Take 500 mg by mouth every 6 (six) hours as needed for mild pain or headache.     [provider]  aspirin EC 81 MG tablet Take 81 mg by mouth daily.    [provider]  atorvastatin (LIPITOR) 40 MG tablet Take  1 tablet  Daily  for Cholesterol 05/06/23   Raynelle Dick, NP   bisoprolol (ZEBETA) 5 MG tablet Take one tablet daily for blood pressure. 05/18/23   Adela Glimpse, NP  Cholecalciferol (VITAMIN D PO) Take 5,000 Units by mouth daily.    [provider]  cyclobenzaprine (FLEXERIL) 10 MG tablet Take 0.5-1 tablets (5-10 mg total) by mouth 3 (three) times daily as needed for muscle spasms. 03/21/23   Raynelle Dick, NP  losartan-hydrochlorothiazide (HYZAAR) 100-25 MG tablet Take  1 tablet  Daily  for BP / Patient knows to take by mouth 03/21/23   Raynelle Dick, NP  Magnesium 400 MG TABS Take 1 tablet by mouth daily.    [provider]  Potassium Chloride ER 20 MEQ TBCR Take 1 tablet Daily for Potassium 05/06/23   Raynelle Dick, NP    Family History Family History  Problem Relation Age of Onset   Stroke Sister    Hypertension Sister    Cancer Sister    Heart attack Brother    Kidney disease Brother    Hypertension Sister    Diabetes Sister    Kidney disease Sister    Colon cancer Neg Hx    Breast cancer Neg Hx     Social History Social History   Tobacco Use   Smoking status: Never  Smokeless tobacco: Never  Substance Use Topics   Alcohol use: No    Alcohol/week: 0.0 standard drinks of alcohol   Drug use: No     Allergies   Ace inhibitors, Naproxen, Norvasc [amlodipine besylate], and Nsaids   Review of Systems Review of Systems  Constitutional: Negative.   HENT:  Positive for dental problem.   Respiratory: Negative.    Cardiovascular: Negative.   Gastrointestinal: Negative.   Musculoskeletal: Negative.   Hematological: Negative.   Psychiatric/Behavioral: Negative.       Physical Exam Triage Vital Signs ED Triage Vitals  Encounter Vitals Group     BP 01/17/24 1138 (!) 158/73     Systolic BP Percentile --      Diastolic BP Percentile --      Pulse Rate 01/17/24 1138 (!) 59     Resp 01/17/24 1138 16     Temp 01/17/24 1138 98.3 F (36.8 C)     Temp Source 01/17/24 1138 Oral     SpO2 01/17/24 1138  96 %     Weight 01/17/24 1138 182 lb (82.6 kg)     Height 01/17/24 1138 5\' 3"  (1.6 m)     Head Circumference --      Peak Flow --      Pain Score 01/17/24 1137 6     Pain Loc --      Pain Education --      Exclude from Growth Chart --    No data found.  Updated Vital Signs BP (!) 158/73 (BP Location: Right Arm)   Pulse (!) 59   Temp 98.3 F (36.8 C) (Oral)   Resp 16   Ht 5\' 3"  (1.6 m)   Wt 82.6 kg   SpO2 96%   BMI 32.24 kg/m   Visual Acuity Right Eye Distance:   Left Eye Distance:   Bilateral Distance:    Right Eye Near:   Left Eye Near:    Bilateral Near:     Physical Exam Constitutional:      Appearance: Normal appearance. She is normal weight.  HENT:     Mouth/Throat:     Mouth: Mucous membranes are moist.     Dentition: No dental tenderness or dental caries.     Tongue: No lesions.     Comments: Under the tongue is a raised, tender erythematous area that is draining yellow fluid;  Neck:     Comments: Enlarged, tender LN at the right submandibular area Cardiovascular:     Rate and Rhythm: Normal rate and regular rhythm.  Pulmonary:     Breath sounds: Normal breath sounds.  Musculoskeletal:     Cervical back: Neck supple. Tenderness present.  Lymphadenopathy:     Cervical: Cervical adenopathy present.  Neurological:     General: No focal deficit present.     Mental Status: She is alert.  Psychiatric:        Mood and Affect: Mood normal.      UC Treatments / Results  Labs (all labs ordered are listed, but only abnormal results are displayed) Labs Reviewed - No data to display  EKG   Radiology No results found.  Procedures Procedures (including critical care time)  Medications Ordered in UC Medications - No data to display  Initial Impression / Assessment and Plan / UC Course  I have reviewed the triage vital signs and the nursing notes.  Pertinent labs & imaging results that were available during my care of the patient were reviewed by  me and considered in my medical decision making (see chart for details).   Final Clinical Impressions(s) / UC Diagnoses   Final diagnoses:  Sublingual abscess     Discharge Instructions      You were seen today for an abscess under your tongue.  I have sent out an antibiotic to treat infection.  Please use tylenol or motrin for pain and swelling.  If this does not improve, then follow up with a dentist.  If this worsens then please go to the ER for further evaluation.     ED Prescriptions     Medication Sig Dispense Auth. Provider   amoxicillin-clavulanate (AUGMENTIN) 875-125 MG tablet Take 1 tablet by mouth every 12 (twelve) hours. 14 tablet Jannifer Franklin, MD      PDMP not reviewed this encounter.   Jannifer Franklin, MD 01/17/24 1158

## 2024-01-17 NOTE — ED Triage Notes (Signed)
 Patient here today with c/o right lower dental pain since Saturday night. Patient states that it feels like she had blisters in her mouth. Patient also has a little swelling on the bottom right side of her jaw.

## 2024-01-18 ENCOUNTER — Encounter: Payer: PPO | Admitting: Nurse Practitioner

## 2024-02-13 DIAGNOSIS — Z01419 Encounter for gynecological examination (general) (routine) without abnormal findings: Secondary | ICD-10-CM | POA: Diagnosis not present

## 2024-02-27 ENCOUNTER — Other Ambulatory Visit: Payer: Self-pay | Admitting: Family Medicine

## 2024-02-27 DIAGNOSIS — Z1231 Encounter for screening mammogram for malignant neoplasm of breast: Secondary | ICD-10-CM

## 2024-04-06 ENCOUNTER — Ambulatory Visit
Admission: RE | Admit: 2024-04-06 | Discharge: 2024-04-06 | Disposition: A | Discharge status: Home / Self Care | Source: Ambulatory Visit | Attending: Family Medicine | Admitting: Family Medicine

## 2024-04-06 DIAGNOSIS — Z1231 Encounter for screening mammogram for malignant neoplasm of breast: Secondary | ICD-10-CM | POA: Diagnosis not present

## 2024-04-10 ENCOUNTER — Ambulatory Visit: Payer: Self-pay | Admitting: Family Medicine

## 2024-04-16 ENCOUNTER — Ambulatory Visit

## 2024-04-16 ENCOUNTER — Encounter: Payer: Self-pay | Admitting: Family Medicine

## 2024-04-16 ENCOUNTER — Ambulatory Visit (INDEPENDENT_AMBULATORY_CARE_PROVIDER_SITE_OTHER): Admitting: Family Medicine

## 2024-04-16 VITALS — BP 158/80 | HR 56 | Temp 98.7°F | Ht 63.0 in | Wt 189.0 lb

## 2024-04-16 DIAGNOSIS — R7309 Other abnormal glucose: Secondary | ICD-10-CM | POA: Diagnosis not present

## 2024-04-16 DIAGNOSIS — N182 Chronic kidney disease, stage 2 (mild): Secondary | ICD-10-CM

## 2024-04-16 DIAGNOSIS — E559 Vitamin D deficiency, unspecified: Secondary | ICD-10-CM

## 2024-04-16 DIAGNOSIS — Z79899 Other long term (current) drug therapy: Secondary | ICD-10-CM | POA: Diagnosis not present

## 2024-04-16 DIAGNOSIS — M79605 Pain in left leg: Secondary | ICD-10-CM | POA: Diagnosis not present

## 2024-04-16 DIAGNOSIS — M7989 Other specified soft tissue disorders: Secondary | ICD-10-CM | POA: Diagnosis not present

## 2024-04-16 DIAGNOSIS — E782 Mixed hyperlipidemia: Secondary | ICD-10-CM

## 2024-04-16 DIAGNOSIS — I10 Essential (primary) hypertension: Secondary | ICD-10-CM | POA: Diagnosis not present

## 2024-04-16 DIAGNOSIS — M79662 Pain in left lower leg: Secondary | ICD-10-CM | POA: Diagnosis not present

## 2024-04-16 LAB — CBC WITH DIFFERENTIAL/PLATELET
Basophils Absolute: 0 10*3/uL (ref 0.0–0.1)
Basophils Relative: 0.3 % (ref 0.0–3.0)
Eosinophils Absolute: 0.2 10*3/uL (ref 0.0–0.7)
Eosinophils Relative: 3.3 % (ref 0.0–5.0)
HCT: 36.4 % (ref 36.0–46.0)
Hemoglobin: 11.8 g/dL — ABNORMAL LOW (ref 12.0–15.0)
Lymphocytes Relative: 38 % (ref 12.0–46.0)
Lymphs Abs: 2.5 10*3/uL (ref 0.7–4.0)
MCHC: 32.3 g/dL (ref 30.0–36.0)
MCV: 83.3 fl (ref 78.0–100.0)
Monocytes Absolute: 0.7 10*3/uL (ref 0.1–1.0)
Monocytes Relative: 10.1 % (ref 3.0–12.0)
Neutro Abs: 3.2 10*3/uL (ref 1.4–7.7)
Neutrophils Relative %: 48.3 % (ref 43.0–77.0)
Platelets: 228 10*3/uL (ref 150.0–400.0)
RBC: 4.37 Mil/uL (ref 3.87–5.11)
RDW: 14.1 % (ref 11.5–15.5)
WBC: 6.6 10*3/uL (ref 4.0–10.5)

## 2024-04-16 LAB — LIPID PANEL
Cholesterol: 119 mg/dL (ref 0–200)
HDL: 41.7 mg/dL (ref >39.00)
LDL Cholesterol: 65 mg/dL (ref 0–99)
NonHDL: 77.57
Total CHOL/HDL Ratio: 3
Triglycerides: 61 mg/dL (ref 0.0–149.0)
VLDL: 12.2 mg/dL (ref 0.0–40.0)

## 2024-04-16 LAB — COMPREHENSIVE METABOLIC PANEL WITH GFR
ALT: 12 U/L (ref 0–35)
AST: 17 U/L (ref 0–37)
Albumin: 4.1 g/dL (ref 3.5–5.2)
Alkaline Phosphatase: 58 U/L (ref 39–117)
BUN: 10 mg/dL (ref 6–23)
CO2: 29 meq/L (ref 19–32)
Calcium: 9.1 mg/dL (ref 8.4–10.5)
Chloride: 107 meq/L (ref 96–112)
Creatinine, Ser: 0.89 mg/dL (ref 0.40–1.20)
GFR: 64.11 mL/min (ref >60.00)
Glucose, Bld: 93 mg/dL (ref 70–99)
Potassium: 4 meq/L (ref 3.5–5.1)
Sodium: 142 meq/L (ref 135–145)
Total Bilirubin: 0.7 mg/dL (ref 0.2–1.2)
Total Protein: 7.3 g/dL (ref 6.0–8.3)

## 2024-04-16 LAB — HEMOGLOBIN A1C: Hgb A1c MFr Bld: 6.3 % (ref 4.6–6.5)

## 2024-04-16 MED ORDER — LOSARTAN POTASSIUM-HCTZ 100-25 MG PO TABS
ORAL_TABLET | ORAL | 3 refills | Status: AC
Start: 2024-04-16 — End: ?

## 2024-04-16 NOTE — Progress Notes (Signed)
 New Patient Office Visit  Subjective    Patient ID: Lydia Edwards, female    DOB: [birth year]  Age: 73 y.o. MRN: [id number]  CC:  Chief Complaint  Patient presents with   Establish Care    Discuss L knee pain, ongoing for about 2 months    HPI Lydia Edwards presents to establish care today. Requesting refill of hyzaar today. Has been out of this medication for about a month. Otherwise, reports compliance with medication regimen.  Reports intermittent L lower leg pain for the last 2-3 months, worse with activity and stairs.  Denies known injury, swelling, erythema, bruising, numbness, loss of strength in the extremities.  Denies other concerns today. She is not fasting today.  Outpatient Encounter Medications as of 04/16/2024  Medication Sig   acetaminophen  (TYLENOL ) 500 MG tablet Take 500 mg by mouth every 6 (six) hours as needed for mild pain or headache.    aspirin EC 81 MG tablet Take 81 mg by mouth daily.   atorvastatin  (LIPITOR) 40 MG tablet Take  1 tablet  Daily  for Cholesterol   bisoprolol  (ZEBETA ) 5 MG tablet Take one tablet daily for blood pressure.   Cholecalciferol (VITAMIN D  PO) Take 5,000 Units by mouth daily.   cyclobenzaprine  (FLEXERIL ) 10 MG tablet Take 0.5-1 tablets (5-10 mg total) by mouth 3 (three) times daily as needed for muscle spasms.   losartan -hydrochlorothiazide  (HYZAAR) 100-25 MG tablet Take  1 tablet  Daily  for BP / Patient knows to take by mouth   Magnesium 400 MG TABS Take 1 tablet by mouth daily.   Potassium Chloride  ER 20 MEQ TBCR Take 1 tablet Daily for Potassium   [DISCONTINUED] amoxicillin -clavulanate (AUGMENTIN ) 875-125 MG tablet Take 1 tablet by mouth every 12 (twelve) hours. (Patient not taking: Reported on 04/16/2024)   [DISCONTINUED] losartan -hydrochlorothiazide  (HYZAAR) 100-25 MG tablet Take  1 tablet  Daily  for BP / Patient knows to take by mouth   No facility-administered encounter medications on file as of 04/16/2024.    Past  Medical History:  Diagnosis Date   ARF (acute renal failure) (HCC) 10/28/2015   CAP (community acquired pneumonia) 11/12/2015   Hyperlipidemia    Hypertension    Prediabetes    SYNDROME, NEPHROTIC W/MINIMAL CHANGE LESION 07/12/2007   Vitamin D  deficiency     Past Surgical History:  Procedure Laterality Date   ABDOMINAL HYSTERECTOMY     EYE SURGERY Left 2017   Cataract sugery left eye    Family History  Problem Relation Age of Onset   Stroke Sister    Hypertension Sister    Cancer Sister    Heart attack Brother    Kidney disease Brother    Hypertension Sister    Diabetes Sister    Kidney disease Sister    Colon cancer Neg Hx    Breast cancer Neg Hx     Social History   Socioeconomic History   Marital status: Married    Spouse name: Not on file   Number of children: Not on file   Years of education: Not on file   Highest education level: Not on file  Occupational History   Not on file  Tobacco Use   Smoking status: Never   Smokeless tobacco: Never  Substance and Sexual Activity   Alcohol use: No    Alcohol/week: 0.0 standard drinks of alcohol   Drug use: No   Sexual activity: Never    Birth control/protection: Abstinence  Other Topics Concern  Not on file  Social History Narrative   Not on file   Social Drivers of Health   Financial Resource Strain: Not on file  Food Insecurity: Not on file  Transportation Needs: Not on file  Physical Activity: Not on file  Stress: Not on file  Social Connections: Not on file  Intimate Partner Violence: Not on file    ROS Per HPI      Objective    BP (!) 158/80 (BP Location: Left Arm, Patient Position: Sitting, Cuff Size: Normal)   Pulse (!) 56   Temp 98.7 F (37.1 C) (Temporal)   Ht 5\' 3"  (1.6 m)   Wt 189 lb (85.7 kg)   SpO2 100%   BMI 33.48 kg/m   Physical Exam Vitals and nursing note reviewed.  Constitutional:      General: She is not in acute distress.    Appearance: Normal appearance. She is  obese.  HENT:     Head: Normocephalic and atraumatic.     Right Ear: External ear normal.     Left Ear: External ear normal.     Nose: Nose normal.     Mouth/Throat:     Mouth: Mucous membranes are moist.     Pharynx: Oropharynx is clear.  Eyes:     Extraocular Movements: Extraocular movements intact.     Pupils: Pupils are equal, round, and reactive to light.  Neck:     Vascular: No carotid bruit.  Cardiovascular:     Rate and Rhythm: Normal rate and regular rhythm.     Pulses: Normal pulses.     Heart sounds: Normal heart sounds.  Pulmonary:     Effort: Pulmonary effort is normal. No respiratory distress.     Breath sounds: Normal breath sounds. No wheezing, rhonchi or rales.  Musculoskeletal:     Cervical back: Normal range of motion.     Right lower leg: Edema (+1 non pitting) present.     Left lower leg: Edema (+2 non pitting) present.     Comments: L knee non tender, no swelling, bruising, erythema, heat, or obvious deformity  Lymphadenopathy:     Cervical: No cervical adenopathy.  Skin:    General: Skin is warm and dry.  Neurological:     General: No focal deficit present.     Mental Status: She is alert and oriented to person, place, and time.  Psychiatric:        Mood and Affect: Mood normal.        Thought Content: Thought content normal.        Assessment & Plan:   Primary hypertension -     CBC with Differential/Platelet -     Comprehensive metabolic panel with GFR -     Losartan  Potassium-HCTZ; Take  1 tablet  Daily  for BP / Patient knows to take by mouth  Dispense: 90 tablet; Refill: 3  Chronic kidney disease (CKD) stage 2 -     Comprehensive metabolic panel with GFR  Vitamin D  deficiency -     VITAMIN D  25 Hydroxy (Vit-D Deficiency, Fractures)  Mixed hyperlipidemia -     Lipid panel  Other abnormal glucose (prediabetes) -     Hemoglobin A1c  Left leg pain Assessment & Plan: R/o stress fx, lesion  Orders: -     DG Tibia/Fibula Left;  Future  Medication management -     CBC with Differential/Platelet -     Comprehensive metabolic panel with GFR -  Hemoglobin A1c -     Lipid panel -     VITAMIN D  25 Hydroxy (Vit-D Deficiency, Fractures)     Return in about 3 months (around 07/17/2024) for meds, labs.   Wellington Half, FNP

## 2024-04-16 NOTE — Patient Instructions (Addendum)
 Welcome to [facility]!  Thank you for choosing us  for your Primary Care needs.   We offer in person and video appointments for your convenience. You may call our office to schedule appointments, or you may schedule appointments with me through MyChart.   The best way to get in contact with me is via MyChart message. This will get to me faster than a phone call, unless there is an emergency, then please call 911.  The lab is located downstairs in the Sports Medicine building, we also have xray available there.   We are checking labs today, will be in contact with any results that require further attention.  We are checking labs today, will be in contact with any results that require further attention  We are getting an xray today. We will be in contact with any abnormal results that require further attention.  Follow up with me in about 3 months for labs and medication management, sooner if needed.

## 2024-04-16 NOTE — Assessment & Plan Note (Signed)
 R/o stress fx, lesion

## 2024-04-17 ENCOUNTER — Ambulatory Visit: Payer: Self-pay | Admitting: Family Medicine

## 2024-04-17 LAB — VITAMIN D 25 HYDROXY (VIT D DEFICIENCY, FRACTURES): VITD: 46.58 ng/mL (ref 30.00–100.00)

## 2024-06-20 ENCOUNTER — Ambulatory Visit (INDEPENDENT_AMBULATORY_CARE_PROVIDER_SITE_OTHER)

## 2024-06-20 VITALS — Ht 63.0 in | Wt 189.0 lb

## 2024-06-20 DIAGNOSIS — Z0000 Encounter for general adult medical examination without abnormal findings: Secondary | ICD-10-CM | POA: Diagnosis not present

## 2024-06-20 NOTE — Progress Notes (Signed)
 Subjective:   Lydia Edwards is a 73 y.o. who presents for a Medicare Wellness preventive visit.  As a reminder, Annual Wellness Visits don't include a physical exam, and some assessments may be limited, especially if this visit is performed virtually. We may recommend an in-person follow-up visit with your provider if needed.  Visit Complete: Virtual I connected with  Lydia Edwards [place] on 06/20/24 by a audio enabled telemedicine application and verified that I am speaking with the correct person using two identifiers.  Patient Location: Home  Provider Location: Home Office  I discussed the limitations of evaluation and management by telemedicine. The patient expressed understanding and agreed to proceed.  Vital Signs: Because this visit was a virtual/telehealth visit, some criteria may be missing or patient reported. Any vitals not documented were not able to be obtained and vitals that have been documented are patient reported.  VideoDeclined- This patient declined Librarian, academic. Therefore the visit was completed with audio only.  Persons Participating in Visit: Patient.  AWV Questionnaire: No: Patient Medicare AWV questionnaire was not completed prior to this visit.  Cardiac Risk Factors include: advanced age (>42men, >29 women);hypertension;Other (see comment);dyslipidemia;obesity (BMI >30kg/m2), Risk factor comments: CKD stage2     Objective:    Today's Vitals   06/20/24 1407  Weight: 189 lb (85.7 kg)  Height: 5' 3 (1.6 m)   Body mass index is 33.48 kg/m.     06/20/2024    2:17 PM 04/13/2021    9:31 AM 05/07/2020    2:15 PM 05/07/2020    2:01 PM 04/09/2019    3:15 PM 01/30/2018   11:29 AM 10/25/2016    2:28 PM  Advanced Directives  Does Patient Have a Medical Advance Directive? No No No No No No  No   Does patient want to make changes to medical advance directive?   Yes (MAU/Ambulatory/Procedural Areas - Information given) Yes  (MAU/Ambulatory/Procedural Areas - Information given)     Would patient like information on creating a medical advance directive?  Yes (MAU/Ambulatory/Procedural Areas - Information given)   Yes (MAU/Ambulatory/Procedural Areas - Information given)  Yes (MAU/Ambulatory/Procedural Areas - Information given)  Yes (MAU/Ambulatory/Procedural Areas - Information given)      Data saved with a previous flowsheet row definition    Current Medications (verified) Outpatient Encounter Medications as of 06/20/2024  Medication Sig   acetaminophen  (TYLENOL ) 500 MG tablet Take 500 mg by mouth every 6 (six) hours as needed for mild pain or headache.    aspirin EC 81 MG tablet Take 81 mg by mouth daily.   atorvastatin  (LIPITOR) 40 MG tablet Take  1 tablet  Daily  for Cholesterol   bisoprolol  (ZEBETA ) 5 MG tablet Take one tablet daily for blood pressure.   Cholecalciferol (VITAMIN D  PO) Take 5,000 Units by mouth daily.   cyclobenzaprine  (FLEXERIL ) 10 MG tablet Take 0.5-1 tablets (5-10 mg total) by mouth 3 (three) times daily as needed for muscle spasms.   losartan -hydrochlorothiazide  (HYZAAR) 100-25 MG tablet Take  1 tablet  Daily  for BP / Patient knows to take by mouth   Magnesium 400 MG TABS Take 1 tablet by mouth daily.   Potassium Chloride  ER 20 MEQ TBCR Take 1 tablet Daily for Potassium   No facility-administered encounter medications on file as of 06/20/2024.    Allergies (verified) Ace inhibitors, Naproxen, Norvasc  [amlodipine  besylate], and Nsaids   History: Past Medical History:  Diagnosis Date   ARF (acute renal failure) (HCC) 10/28/2015  CAP (community acquired pneumonia) 11/12/2015   Hyperlipidemia    Hypertension    Prediabetes    SYNDROME, NEPHROTIC W/MINIMAL CHANGE LESION 07/12/2007   Vitamin D  deficiency    Past Surgical History:  Procedure Laterality Date   ABDOMINAL HYSTERECTOMY     EYE SURGERY Left 2017   Cataract sugery left eye   Family History  Problem Relation Age of  Onset   Stroke Sister    Hypertension Sister    Cancer Sister    Heart attack Brother    Kidney disease Brother    Hypertension Sister    Diabetes Sister    Kidney disease Sister    Colon cancer Neg Hx    Breast cancer Neg Hx    Social History   Socioeconomic History   Marital status: Widowed    Spouse name: Not on file   Number of children: 2   Years of education: Not on file   Highest education level: Not on file  Occupational History   Occupation: RETIRED  Tobacco Use   Smoking status: Never   Smokeless tobacco: Never  Vaping Use   Vaping status: Never Used  Substance and Sexual Activity   Alcohol use: No    Alcohol/week: 0.0 standard drinks of alcohol   Drug use: No   Sexual activity: Never    Birth control/protection: Abstinence  Other Topics Concern   Not on file  Social History Narrative   Lives alone 2025   Social Drivers of Health   Financial Resource Strain: Not on file  Food Insecurity: Not on file  Transportation Needs: No Transportation Needs (06/20/2024)   PRAPARE - Transportation    Lack of Transportation (Medical): No    Lack of Transportation (Non-Medical): No  Physical Activity: Insufficiently Active (06/20/2024)   Exercise Vital Sign    Days of Exercise per Week: 2 days    Minutes of Exercise per Session: 40 min  Stress: No Stress Concern Present (06/20/2024)   [employer] of Occupational Health - Occupational Stress Questionnaire    Feeling of Stress: Not at all  Social Connections: Moderately Integrated (06/20/2024)   Social Connection and Isolation Panel    Frequency of Communication with Friends and Family: More than three times a week    Frequency of Social Gatherings with Friends and Family: Once a week    Attends Religious Services: More than 4 times per year    Active Member of [employer] or Organizations: Yes    Attends Banker Meetings: Never    Marital Status: Widowed    Tobacco Counseling Counseling given: Not  Answered    Clinical Intake:  Pre-visit preparation completed: Yes  Pain : No/denies pain     BMI - recorded: 33.48 Nutritional Status: BMI > 30  Obese Nutritional Risks: None Diabetes: No  Lab Results  Component Value Date   HGBA1C 6.3 04/16/2024   HGBA1C 6.6 (H) 07/18/2023   HGBA1C 6.3 (H) 01/17/2023     How often do you need to have someone help you when you read instructions, pamphlets, or other written materials from your doctor or pharmacy?: 1 - Never  Interpreter Needed?: No  Information entered by :: Vaunda Gutterman, RMA   Activities of Daily Living    06/20/2024    2:11 PM  In your present state of health, do you have any difficulty performing the following activities:  Hearing? 0  Vision? 0  Difficulty concentrating or making decisions? 0  Walking or climbing stairs? 0  Dressing or bathing? 0  Doing errands, shopping? 0  Preparing Food and eating ? N  Using the Toilet? N  In the past six months, have you accidently leaked urine? N  Do you have problems with loss of bowel control? N  Managing your Medications? N  Managing your Finances? N  Housekeeping or managing your Housekeeping? N    Patient Care Team: Alvia Corean CROME, FNP as PCP - General (Family Medicine) Rox Charleston, MD as Consulting Physician (Obstetrics and Gynecology) Obie Princella HERO, MD (Inactive) as Consulting Physician (Gastroenterology)  I have updated your Care Teams any recent Medical Services you may have received from other providers in the past year.     Assessment:   This is a routine wellness examination for Lydia Edwards.  Hearing/Vision screen Hearing Screening - Comments:: Denies hearing difficulties   Vision Screening - Comments:: Wears eyeglasses/ Dr Octavia   Goals Addressed               This Visit's Progress     Patient Stated (pt-stated)        Not at this time/2025       Depression Screen     06/20/2024    2:20 PM 04/16/2024    3:31 PM 04/16/2024     1:50 PM 07/27/2021   12:30 AM 04/13/2021    9:34 AM 01/11/2021    8:59 PM 05/07/2020    2:23 PM  PHQ 2/9 Scores  PHQ - 2 Score 0 0 0 0 0 0 0  PHQ- 9 Score 0          Fall Risk     06/20/2024    2:18 PM 07/27/2021   12:30 AM 04/13/2021    9:34 AM 01/11/2021    8:59 PM 05/07/2020    2:23 PM  Fall Risk   Falls in the past year? 0 0 0 0 0  Number falls in past yr: 0  0  0  Injury with Fall? 0  0  0  Risk for fall due to :  No Fall Risks No Fall Risks No Fall Risks No Fall Risks  Follow up Falls evaluation completed;Falls prevention discussed Falls evaluation completed;Education provided;Falls prevention discussed  Falls evaluation completed;Falls prevention discussed  Falls evaluation completed;Education provided;Falls prevention discussed  Falls prevention discussed      Data saved with a previous flowsheet row definition    MEDICARE RISK AT HOME:  Medicare Risk at Home Any stairs in or around the home?: Yes (inside and outside) If so, are there any without handrails?: No Home free of loose throw rugs in walkways, pet beds, electrical cords, etc?: Yes Adequate lighting in your home to reduce risk of falls?: Yes Life alert?: No Use of a cane, walker or w/c?: No Grab bars in the bathroom?: No Shower chair or bench in shower?: Yes Elevated toilet seat or a handicapped toilet?: Yes  TIMED UP AND GO:  Was the test performed?  No  Cognitive Function: 6CIT completed        Immunizations Immunization History  Administered Date(s) Administered   Influenza, High Dose Seasonal PF 08/28/2018, 10/22/2019, 11/05/2020, 10/24/2023   Influenza,inj,quad, With Preservative 10/25/2016   Influenza,trivalent, recombinat, inj, PF 09/07/2015   Influenza-Unspecified 08/30/2015   PFIZER(Purple Top)SARS-COV-2 Vaccination 12/24/2019, 01/14/2020, 09/02/2020   Pneumococcal Conjugate-13 01/30/2018   Pneumococcal Polysaccharide-23 10/29/2015   Td 02/19/2000    Screening Tests Health Maintenance   Topic Date Due   Zoster Vaccines- Shingrix (1 of 2) Never done  DTaP/Tdap/Td (2 - Tdap) 02/18/2010   Medicare Annual Wellness (AWV)  04/13/2022   COVID-19 Vaccine (4 - 2024-25 season) 07/31/2023   INFLUENZA VACCINE  06/29/2024   Colonoscopy  09/09/2025   MAMMOGRAM  04/06/2026   Pneumococcal Vaccine: 50+ Years  Completed   DEXA SCAN  Completed   Hepatitis C Screening  Completed   Hepatitis B Vaccines  Aged Out   HPV VACCINES  Aged Out   Meningococcal B Vaccine  Aged Out    Health Maintenance  Health Maintenance Due  Topic Date Due   Zoster Vaccines- Shingrix (1 of 2) Never done   DTaP/Tdap/Td (2 - Tdap) 02/18/2010   Medicare Annual Wellness (AWV)  04/13/2022   COVID-19 Vaccine (4 - 2024-25 season) 07/31/2023   Health Maintenance Items Addressed: DEXA ordered, See Nurse Notes at the end of this note  Additional Screening:  Vision Screening: Recommended annual ophthalmology exams for early detection of glaucoma and other disorders of the eye. Would you like a referral to an eye doctor? No    Dental Screening: Recommended annual dental exams for proper oral hygiene  Community Resource Referral / Chronic Care Management: CRR required this visit?  No   CCM required this visit?  No   Plan:    I have personally reviewed and noted the following in the patient's chart:   Medical and social history Use of alcohol, tobacco or illicit drugs  Current medications and supplements including opioid prescriptions. Patient is not currently taking opioid prescriptions. Functional ability and status Nutritional status Physical activity Advanced directives List of other physicians Hospitalizations, surgeries, and ER visits in previous 12 months Vitals Screenings to include cognitive, depression, and falls Referrals and appointments  In addition, I have reviewed and discussed with patient certain preventive protocols, quality metrics, and best practice recommendations. A written  personalized care plan for preventive services as well as general preventive health recommendations were provided to patient.   Burl Tauzin L Annmargaret Decaprio, CMA   06/20/2024   After Visit Summary: (Mail) Due to this being a telephonic visit, the after visit summary with patients personalized plan was offered to patient via mail   Notes: Patient is due for a Shingrix and Tdap vaccine.  She had no other concerns to address today.

## 2024-06-20 NOTE — Patient Instructions (Signed)
 Lydia Edwards , Thank you for taking time out of your busy schedule to complete your Annual Wellness Visit with me. I enjoyed our conversation and look forward to speaking with you again next year. I, as well as your care team,  appreciate your ongoing commitment to your health goals. Please review the following plan we discussed and let me know if I can assist you in the future. Your Game plan/ To Do List    Referrals: If you haven't heard from the office you've been referred to, please reach out to them at the phone provided.  You have an order for:  [x]   Bone Density     Please call for appointment:  [facility] [address] [place], [place] [address] [phone]   Make sure to wear two-piece clothing.  No lotions, powders, or deodorants the day of the appointment. Make sure to bring picture ID and insurance card.  Bring list of medications you are currently taking including any supplements.   Follow up Visits: Next Medicare AWV with our clinical staff: Patient prefers telephone visit. 06/21/2025.   Have you seen your provider in the last 6 months (3 months if uncontrolled diabetes)? Yes Next Office Visit with your provider: 07/17/2024.  Clinician Recommendations:  Aim for 30 minutes of exercise or brisk walking, 6-8 glasses of water, and 5 servings of fruits and vegetables each day. You are due for Shingles vaccine and a Tdap, which you can get both done at your local pharmacy.        This is a list of the screening recommended for you and due dates:  Health Maintenance  Topic Date Due   Zoster (Shingles) Vaccine (1 of 2) Never done   DTaP/Tdap/Td vaccine (2 - Tdap) 02/18/2010   Medicare Annual Wellness Visit  04/13/2022   COVID-19 Vaccine (4 - 2024-25 season) 07/31/2023   Flu Shot  06/29/2024   Colon Cancer Screening  09/09/2025   Mammogram  04/06/2026   Pneumococcal Vaccine for age over 44  Completed   DEXA scan (bone density measurement)  Completed    Hepatitis C Screening  Completed   Hepatitis B Vaccine  Aged Out   HPV Vaccine  Aged Out   Meningitis B Vaccine  Aged Out    Advanced directives: (Declined) Advance directive discussed with you today. Even though you declined this today, please call our office should you change your mind, and we can give you the proper paperwork for you to fill out. Advance Care Planning is important because it:  [x]  Makes sure you receive the medical care that is consistent with your values, goals, and preferences  [x]  It provides guidance to your family and loved ones and reduces their decisional burden about whether or not they are making the right decisions based on your wishes.  Follow the link provided in your after visit summary or read over the paperwork we have mailed to you to help you started getting your Advance Directives in place. If you need assistance in completing these, please reach out to us  so that we can help you!  See attachments for Preventive Care and Fall Prevention Tips.

## 2024-07-17 ENCOUNTER — Ambulatory Visit (INDEPENDENT_AMBULATORY_CARE_PROVIDER_SITE_OTHER): Admitting: Family Medicine

## 2024-07-17 ENCOUNTER — Ambulatory Visit: Payer: Self-pay | Admitting: Family Medicine

## 2024-07-17 ENCOUNTER — Encounter: Payer: Self-pay | Admitting: Family Medicine

## 2024-07-17 VITALS — BP 138/76 | HR 61 | Temp 98.6°F | Ht 63.0 in | Wt 186.2 lb

## 2024-07-17 DIAGNOSIS — E782 Mixed hyperlipidemia: Secondary | ICD-10-CM | POA: Diagnosis not present

## 2024-07-17 DIAGNOSIS — Z6832 Body mass index (BMI) 32.0-32.9, adult: Secondary | ICD-10-CM

## 2024-07-17 DIAGNOSIS — I10 Essential (primary) hypertension: Secondary | ICD-10-CM

## 2024-07-17 DIAGNOSIS — R7309 Other abnormal glucose: Secondary | ICD-10-CM | POA: Diagnosis not present

## 2024-07-17 DIAGNOSIS — Z79899 Other long term (current) drug therapy: Secondary | ICD-10-CM

## 2024-07-17 DIAGNOSIS — E559 Vitamin D deficiency, unspecified: Secondary | ICD-10-CM | POA: Diagnosis not present

## 2024-07-17 DIAGNOSIS — Z780 Asymptomatic menopausal state: Secondary | ICD-10-CM

## 2024-07-17 DIAGNOSIS — N182 Chronic kidney disease, stage 2 (mild): Secondary | ICD-10-CM

## 2024-07-17 DIAGNOSIS — E66811 Obesity, class 1: Secondary | ICD-10-CM | POA: Diagnosis not present

## 2024-07-17 DIAGNOSIS — E6609 Other obesity due to excess calories: Secondary | ICD-10-CM | POA: Diagnosis not present

## 2024-07-17 LAB — URINALYSIS, ROUTINE W REFLEX MICROSCOPIC
Bilirubin Urine: NEGATIVE
Ketones, ur: NEGATIVE
Nitrite: POSITIVE — AB
Specific Gravity, Urine: 1.01 (ref 1.000–1.030)
Total Protein, Urine: NEGATIVE
Urine Glucose: NEGATIVE
Urobilinogen, UA: 0.2 (ref 0.0–1.0)
pH: 7 (ref 5.0–8.0)

## 2024-07-17 LAB — CBC WITH DIFFERENTIAL/PLATELET
Basophils Absolute: 0.1 K/uL (ref 0.0–0.1)
Basophils Relative: 1 % (ref 0.0–3.0)
Eosinophils Absolute: 0.1 K/uL (ref 0.0–0.7)
Eosinophils Relative: 1.8 % (ref 0.0–5.0)
HCT: 39.4 % (ref 36.0–46.0)
Hemoglobin: 12.6 g/dL (ref 12.0–15.0)
Lymphocytes Relative: 26 % (ref 12.0–46.0)
Lymphs Abs: 2 K/uL (ref 0.7–4.0)
MCHC: 32.1 g/dL (ref 30.0–36.0)
MCV: 82.8 fl (ref 78.0–100.0)
Monocytes Absolute: 0.7 K/uL (ref 0.1–1.0)
Monocytes Relative: 9.3 % (ref 3.0–12.0)
Neutro Abs: 4.8 K/uL (ref 1.4–7.7)
Neutrophils Relative %: 61.9 % (ref 43.0–77.0)
Platelets: 230 K/uL (ref 150.0–400.0)
RBC: 4.75 Mil/uL (ref 3.87–5.11)
RDW: 14.3 % (ref 11.5–15.5)
WBC: 7.7 K/uL (ref 4.0–10.5)

## 2024-07-17 LAB — COMPREHENSIVE METABOLIC PANEL WITH GFR
ALT: 13 U/L (ref 0–35)
AST: 18 U/L (ref 0–37)
Albumin: 4.1 g/dL (ref 3.5–5.2)
Alkaline Phosphatase: 56 U/L (ref 39–117)
BUN: 15 mg/dL (ref 6–23)
CO2: 30 meq/L (ref 19–32)
Calcium: 9.2 mg/dL (ref 8.4–10.5)
Chloride: 99 meq/L (ref 96–112)
Creatinine, Ser: 1.01 mg/dL (ref 0.40–1.20)
GFR: 54.98 mL/min — ABNORMAL LOW (ref >60.00)
Glucose, Bld: 110 mg/dL — ABNORMAL HIGH (ref 70–99)
Potassium: 3.1 meq/L — ABNORMAL LOW (ref 3.5–5.1)
Sodium: 139 meq/L (ref 135–145)
Total Bilirubin: 0.6 mg/dL (ref 0.2–1.2)
Total Protein: 7.3 g/dL (ref 6.0–8.3)

## 2024-07-17 LAB — LIPID PANEL
Cholesterol: 127 mg/dL (ref 0–200)
HDL: 43.5 mg/dL (ref >39.00)
LDL Cholesterol: 73 mg/dL (ref 0–99)
NonHDL: 83.86
Total CHOL/HDL Ratio: 3
Triglycerides: 54 mg/dL (ref 0.0–149.0)
VLDL: 10.8 mg/dL (ref 0.0–40.0)

## 2024-07-17 LAB — VITAMIN D 25 HYDROXY (VIT D DEFICIENCY, FRACTURES): VITD: 59.32 ng/mL (ref 30.00–100.00)

## 2024-07-17 LAB — HEMOGLOBIN A1C: Hgb A1c MFr Bld: 6.8 % — ABNORMAL HIGH (ref 4.6–6.5)

## 2024-07-17 MED ORDER — ATORVASTATIN CALCIUM 40 MG PO TABS
ORAL_TABLET | ORAL | 3 refills | Status: AC
Start: 2024-07-17 — End: ?

## 2024-07-17 MED ORDER — POTASSIUM CHLORIDE ER 20 MEQ PO TBCR: EXTENDED_RELEASE_TABLET | ORAL | 3 refills | Status: AC

## 2024-07-17 NOTE — Progress Notes (Signed)
 Established Patient Office Visit  Subjective:     Patient ID: Lydia Edwards, female    DOB: Nov 15, 1951, 73 y.o.   MRN: [id number]  Chief Complaint  Patient presents with   Hypertension   Diabetes    HPI  Discussed the use of AI scribe software for clinical note transcription with the patient, who gave verbal consent to proceed.  History of Present Illness Lydia Edwards is a 73 year old female who presents for follow-up and medication refills.  Hypertension - Blood pressure has improved since resuming antihypertensive medication.  Peripheral edema - Mild swelling in the ankles, more pronounced on left side. - Edema is associated with prior kidney issues.  Renal history - History of hospitalization for 17 days due to kidney issues. - Underwent dialysis three times during hospitalization.  Diabetes mellitus - Hemoglobin A1c is 6.1.  Dietary habits - Typically consumes toast and coffee in the morning.     ROS Per HPI      Objective:    BP 138/76   Pulse 61   Temp 98.6 F (37 C)   Ht 5' 3 (1.6 m)   Wt 186 lb 3.2 oz (84.5 kg)   SpO2 96%   BMI 32.98 kg/m    Physical Exam Vitals and nursing note reviewed.  Constitutional:      General: She is not in acute distress.    Appearance: Normal appearance.  HENT:     Head: Normocephalic and atraumatic.     Right Ear: External ear normal.     Left Ear: External ear normal.     Nose: Nose normal.     Mouth/Throat:     Mouth: Mucous membranes are moist.     Pharynx: Oropharynx is clear.  Eyes:     Extraocular Movements: Extraocular movements intact.     Pupils: Pupils are equal, round, and reactive to light.  Neck:     Vascular: No carotid bruit.  Cardiovascular:     Rate and Rhythm: Normal rate and regular rhythm.     Pulses: Normal pulses.     Heart sounds: Murmur heard.  Pulmonary:     Effort: Pulmonary effort is normal. No respiratory distress.     Breath sounds: Normal breath sounds. No  wheezing, rhonchi or rales.  Musculoskeletal:        General: Normal range of motion.     Cervical back: Normal range of motion.     Right lower leg: Edema (trace) present.     Left lower leg: No edema (+1).  Lymphadenopathy:     Cervical: No cervical adenopathy.  Neurological:     General: No focal deficit present.     Mental Status: She is alert and oriented to person, place, and time.  Psychiatric:        Mood and Affect: Mood normal.        Thought Content: Thought content normal.     No results found for any visits on 07/17/24.  The ASCVD Risk score (Arnett DK, et al., 2019) failed to calculate for the following reasons:   The valid total cholesterol range is 130 to 320 mg/dL  BP Readings from Last 3 Encounters:  07/17/24 138/76  04/16/24 (!) 158/80  01/17/24 (!) 158/73   Wt Readings from Last 3 Encounters:  07/17/24 186 lb 3.2 oz (84.5 kg)  06/20/24 189 lb (85.7 kg)  04/16/24 189 lb (85.7 kg)      Last CBC Lab Results  Component Value  Date   WBC 6.6 04/16/2024   HGB 11.8 (L) 04/16/2024   HCT 36.4 04/16/2024   MCV 83.3 04/16/2024   MCH 26.5 (L) 10/24/2023   RDW 14.1 04/16/2024   PLT 228.0 04/16/2024   Last metabolic panel Lab Results  Component Value Date   GLUCOSE 93 04/16/2024   NA 142 04/16/2024   K 4.0 04/16/2024   CL 107 04/16/2024   CO2 29 04/16/2024   BUN 10 04/16/2024   CREATININE 0.89 04/16/2024   GFR 64.11 04/16/2024   CALCIUM  9.1 04/16/2024   PHOS 6.8 (H) 11/13/2015   PROT 7.3 04/16/2024   ALBUMIN 4.1 04/16/2024   BILITOT 0.7 04/16/2024   ALKPHOS 58 04/16/2024   AST 17 04/16/2024   ALT 12 04/16/2024   ANIONGAP 10 11/13/2015   Last lipids Lab Results  Component Value Date   CHOL 119 04/16/2024   HDL 41.70 04/16/2024   LDLCALC 65 04/16/2024   LDLDIRECT 160.1 07/18/2009   TRIG 61.0 04/16/2024   CHOLHDL 3 04/16/2024   Last hemoglobin A1c Lab Results  Component Value Date   HGBA1C 6.3 04/16/2024   Last thyroid  functions Lab  Results  Component Value Date   TSH 1.23 07/18/2023   Last vitamin D  Lab Results  Component Value Date   VD25OH 46.58 04/16/2024   Last vitamin B12 and Folate Lab Results  Component Value Date   VITAMINB12 491 07/26/2022         Assessment & Plan:   Assessment and Plan Assessment & Plan Primary hypertension Blood pressure improved, no headaches. - Continue current antihypertensive regimen.  Chronic kidney disease stage 2 Renal function well-managed, no significant ankle swelling.  Mixed hyperlipidemia Atorvastatin  refills sent. - Continue atorvastatin  40 mg orally daily. - lipids today  Other abnormal glucose (prediabetes) A1c at 6.1, satisfactory.  Postmenopausal Estrogen Deficiency - DEXA ordered today   Immunizations - Due for tetanus and shingles vaccines, will get these at pharmacy  BMI 32 - Continue efforts in healthy diet and activity level     Orders Placed This Encounter  Procedures   DG Bone Density    Standing Status:   Future    Expiration Date:   07/17/2025    Reason for Exam (SYMPTOM  OR DIAGNOSIS REQUIRED):   postmenopausal estrogen deficiency    Preferred imaging location?:   [facility]   Comprehensive metabolic panel with GFR    Release to patient:   Immediate [1]   Hemoglobin A1c   Lipid panel   CBC with Differential/Platelet    Release to patient:   Immediate [1]   VITAMIN D  25 Hydroxy (Vit-D Deficiency, Fractures)   Urinalysis, Routine w reflex microscopic    Standing Status:   Future    Number of Occurrences:   1    Expected Date:   07/17/2024    Expiration Date:   07/17/2025     Meds ordered this encounter  Medications   atorvastatin  (LIPITOR) 40 MG tablet    Sig: Take  1 tablet  Daily  for Cholesterol    Dispense:  90 tablet    Refill:  3    Patient knows to take by mouth  !   - Thanks     /     ctcs.cfs   Potassium Chloride  ER 20 MEQ TBCR    Sig: Take 1 tablet Daily for Potassium    Dispense:  90 tablet     Refill:  3    Return in about 3 months (  around 10/17/2024) for meds .  Corean LITTIE Ku, FNP

## 2024-07-17 NOTE — Patient Instructions (Signed)
 We are checking labs today, will be in contact with any results that require further attention  Please continue current medication regimen.   Follow up with me in about 3 months for labs and medication management, sooner if needed.

## 2024-08-15 DIAGNOSIS — Z961 Presence of intraocular lens: Secondary | ICD-10-CM | POA: Diagnosis not present

## 2024-08-15 DIAGNOSIS — H0102A Squamous blepharitis right eye, upper and lower eyelids: Secondary | ICD-10-CM | POA: Diagnosis not present

## 2024-08-15 DIAGNOSIS — G510 Bell's palsy: Secondary | ICD-10-CM | POA: Diagnosis not present

## 2024-08-15 DIAGNOSIS — H0102B Squamous blepharitis left eye, upper and lower eyelids: Secondary | ICD-10-CM | POA: Diagnosis not present

## 2024-08-15 DIAGNOSIS — H40013 Open angle with borderline findings, low risk, bilateral: Secondary | ICD-10-CM | POA: Diagnosis not present

## 2024-08-15 DIAGNOSIS — H43813 Vitreous degeneration, bilateral: Secondary | ICD-10-CM | POA: Diagnosis not present

## 2024-08-27 ENCOUNTER — Ambulatory Visit (INDEPENDENT_AMBULATORY_CARE_PROVIDER_SITE_OTHER)
Admission: RE | Admit: 2024-08-27 | Discharge: 2024-08-27 | Disposition: A | Discharge status: Home / Self Care | Source: Ambulatory Visit | Attending: Family Medicine | Admitting: Family Medicine

## 2024-08-27 DIAGNOSIS — Z780 Asymptomatic menopausal state: Secondary | ICD-10-CM | POA: Diagnosis not present

## 2024-10-22 ENCOUNTER — Ambulatory Visit: Admitting: Family Medicine

## 2024-10-22 ENCOUNTER — Encounter: Payer: Self-pay | Admitting: Family Medicine

## 2024-10-22 VITALS — BP 134/70 | HR 52 | Temp 98.7°F | Ht 63.0 in | Wt 189.8 lb

## 2024-10-22 DIAGNOSIS — R7309 Other abnormal glucose: Secondary | ICD-10-CM | POA: Diagnosis not present

## 2024-10-22 DIAGNOSIS — G8929 Other chronic pain: Secondary | ICD-10-CM

## 2024-10-22 DIAGNOSIS — M25511 Pain in right shoulder: Secondary | ICD-10-CM | POA: Diagnosis not present

## 2024-10-22 DIAGNOSIS — I10 Essential (primary) hypertension: Secondary | ICD-10-CM

## 2024-10-22 DIAGNOSIS — Z23 Encounter for immunization: Secondary | ICD-10-CM | POA: Diagnosis not present

## 2024-10-22 DIAGNOSIS — N182 Chronic kidney disease, stage 2 (mild): Secondary | ICD-10-CM

## 2024-10-22 DIAGNOSIS — Z79899 Other long term (current) drug therapy: Secondary | ICD-10-CM

## 2024-10-22 DIAGNOSIS — E782 Mixed hyperlipidemia: Secondary | ICD-10-CM | POA: Diagnosis not present

## 2024-10-22 LAB — COMPREHENSIVE METABOLIC PANEL WITH GFR
ALT: 12 U/L (ref 0–35)
AST: 17 U/L (ref 0–37)
Albumin: 4.2 g/dL (ref 3.5–5.2)
Alkaline Phosphatase: 59 U/L (ref 39–117)
BUN: 10 mg/dL (ref 6–23)
CO2: 31 meq/L (ref 19–32)
Calcium: 9.3 mg/dL (ref 8.4–10.5)
Chloride: 102 meq/L (ref 96–112)
Creatinine, Ser: 0.91 mg/dL (ref 0.40–1.20)
GFR: 62.19 mL/min (ref >60.00)
Glucose, Bld: 110 mg/dL — ABNORMAL HIGH (ref 70–99)
Potassium: 3.8 meq/L (ref 3.5–5.1)
Sodium: 139 meq/L (ref 135–145)
Total Bilirubin: 0.7 mg/dL (ref 0.2–1.2)
Total Protein: 7.3 g/dL (ref 6.0–8.3)

## 2024-10-22 LAB — CBC WITH DIFFERENTIAL/PLATELET
Basophils Absolute: 0.1 K/uL (ref 0.0–0.1)
Basophils Relative: 0.9 % (ref 0.0–3.0)
Eosinophils Absolute: 0.1 K/uL (ref 0.0–0.7)
Eosinophils Relative: 2.2 % (ref 0.0–5.0)
HCT: 38.3 % (ref 36.0–46.0)
Hemoglobin: 12.5 g/dL (ref 12.0–15.0)
Lymphocytes Relative: 32.4 % (ref 12.0–46.0)
Lymphs Abs: 2.1 K/uL (ref 0.7–4.0)
MCHC: 32.5 g/dL (ref 30.0–36.0)
MCV: 84.2 fl (ref 78.0–100.0)
Monocytes Absolute: 0.7 K/uL (ref 0.1–1.0)
Monocytes Relative: 10.3 % (ref 3.0–12.0)
Neutro Abs: 3.5 K/uL (ref 1.4–7.7)
Neutrophils Relative %: 54.2 % (ref 43.0–77.0)
Platelets: 217 K/uL (ref 150.0–400.0)
RBC: 4.55 Mil/uL (ref 3.87–5.11)
RDW: 14 % (ref 11.5–15.5)
WBC: 6.5 K/uL (ref 4.0–10.5)

## 2024-10-22 LAB — LIPID PANEL
Cholesterol: 140 mg/dL (ref 0–200)
HDL: 44.6 mg/dL (ref >39.00)
LDL Cholesterol: 83 mg/dL (ref 0–99)
NonHDL: 95.87
Total CHOL/HDL Ratio: 3
Triglycerides: 66 mg/dL (ref 0.0–149.0)
VLDL: 13.2 mg/dL (ref 0.0–40.0)

## 2024-10-22 LAB — HEMOGLOBIN A1C: Hgb A1c MFr Bld: 6.3 % (ref 4.6–6.5)

## 2024-10-22 MED ORDER — BISOPROLOL FUMARATE 5 MG PO TABS: ORAL_TABLET | ORAL | 3 refills | Status: AC

## 2024-10-22 MED ORDER — CYCLOBENZAPRINE HCL 10 MG PO TABS: 5.0000 mg | ORAL_TABLET | Freq: Three times a day (TID) | ORAL | 1 refills | Status: AC | PRN

## 2024-10-22 NOTE — Progress Notes (Signed)
 Established Patient Office Visit  Subjective:     Patient ID: Lydia Edwards, female    DOB: 1951-04-22, 73 y.o.   MRN: [id number]  No chief complaint on file.   HPI  Discussed the use of AI scribe software for clinical note transcription with the patient, who gave verbal consent to proceed.  History of Present Illness Lydia Edwards is a 73 year old female who presents for a routine follow-up visit.  Lower extremity pain and erythema - Aches and pains described as 'red legs and pains' without specific location or severity - No associated swelling - No changes in blood pressure  Foot discomfort - Discomfort on the dorsal aspect of both feet when wearing shoes, especially during ushering - No swelling observed  Physical activity tolerance - Ambulates approximately half a mile, three times per week while walking her dog - Avoids walking in cold weather     ROS Per HPI      Objective:    BP 134/70 (BP Location: Left Arm, Patient Position: Sitting)   Pulse (!) 52   Temp 98.7 F (37.1 C) (Temporal)   Ht 5' 3 (1.6 m)   Wt 189 lb 12.8 oz (86.1 kg)   SpO2 97%   BMI 33.62 kg/m    Physical Exam Vitals and nursing note reviewed.  Constitutional:      General: She is not in acute distress.    Appearance: Normal appearance. She is obese.  HENT:     Head: Normocephalic and atraumatic.     Right Ear: External ear normal.     Left Ear: External ear normal.     Nose: Nose normal.     Mouth/Throat:     Mouth: Mucous membranes are moist.     Pharynx: Oropharynx is clear.  Eyes:     Extraocular Movements: Extraocular movements intact.     Pupils: Pupils are equal, round, and reactive to light.  Neck:     Vascular: No carotid bruit.  Cardiovascular:     Rate and Rhythm: Normal rate and regular rhythm.     Pulses: Normal pulses.     Heart sounds: Normal heart sounds.  Pulmonary:     Effort: Pulmonary effort is normal. No respiratory distress.     Breath  sounds: Normal breath sounds. No wheezing, rhonchi or rales.  Musculoskeletal:        General: Normal range of motion.     Cervical back: Normal range of motion.     Right lower leg: No edema.     Left lower leg: No edema.  Lymphadenopathy:     Cervical: No cervical adenopathy.  Neurological:     General: No focal deficit present.     Mental Status: She is alert and oriented to person, place, and time.  Psychiatric:        Mood and Affect: Mood normal.        Thought Content: Thought content normal.     Results for orders placed or performed in visit on 10/22/24  CBC with Differential/Platelet  Result Value Ref Range   WBC 6.5 4.0 - 10.5 K/uL   RBC 4.55 3.87 - 5.11 Mil/uL   Hemoglobin 12.5 12.0 - 15.0 g/dL   HCT 61.6 63.9 - 53.9 %   MCV 84.2 78.0 - 100.0 fl   MCHC 32.5 30.0 - 36.0 g/dL   RDW 85.9 88.4 - 84.4 %   Platelets 217.0 150.0 - 400.0 K/uL   Neutrophils Relative % 54.2  43.0 - 77.0 %   Lymphocytes Relative 32.4 12.0 - 46.0 %   Monocytes Relative 10.3 3.0 - 12.0 %   Eosinophils Relative 2.2 0.0 - 5.0 %   Basophils Relative 0.9 0.0 - 3.0 %   Neutro Abs 3.5 1.4 - 7.7 K/uL   Lymphs Abs 2.1 0.7 - 4.0 K/uL   Monocytes Absolute 0.7 0.1 - 1.0 K/uL   Eosinophils Absolute 0.1 0.0 - 0.7 K/uL   Basophils Absolute 0.1 0.0 - 0.1 K/uL  Comprehensive metabolic panel with GFR  Result Value Ref Range   Sodium 139 135 - 145 mEq/L   Potassium 3.8 3.5 - 5.1 mEq/L   Chloride 102 96 - 112 mEq/L   CO2 31 19 - 32 mEq/L   Glucose, Bld 110 (H) 70 - 99 mg/dL   BUN 10 6 - 23 mg/dL   Creatinine, Ser 9.08 0.40 - 1.20 mg/dL   Total Bilirubin 0.7 0.2 - 1.2 mg/dL   Alkaline Phosphatase 59 39 - 117 U/L   AST 17 0 - 37 U/L   ALT 12 0 - 35 U/L   Total Protein 7.3 6.0 - 8.3 g/dL   Albumin 4.2 3.5 - 5.2 g/dL   GFR 37.80 >39.99 mL/min   Calcium  9.3 8.4 - 10.5 mg/dL  Hemoglobin J8r  Result Value Ref Range   Hgb A1c MFr Bld 6.3 4.6 - 6.5 %  Lipid Profile  Result Value Ref Range   Cholesterol  140 0 - 200 mg/dL   Triglycerides 33.9 0.0 - 149.0 mg/dL   HDL 55.39 >60.99 mg/dL   VLDL 86.7 0.0 - 59.9 mg/dL   LDL Cholesterol 83 0 - 99 mg/dL   Total CHOL/HDL Ratio 3    NonHDL 95.87     The 10-year ASCVD risk score (Arnett DK, et al., 2019) is: 10.7%  BP Readings from Last 3 Encounters:  10/22/24 134/70  07/17/24 138/76  04/16/24 (!) 158/80   Wt Readings from Last 3 Encounters:  10/22/24 189 lb 12.8 oz (86.1 kg)  07/17/24 186 lb 3.2 oz (84.5 kg)  06/20/24 189 lb (85.7 kg)      Last CBC Lab Results  Component Value Date   WBC 6.5 10/22/2024   HGB 12.5 10/22/2024   HCT 38.3 10/22/2024   MCV 84.2 10/22/2024   MCH 26.5 (L) 10/24/2023   RDW 14.0 10/22/2024   PLT 217.0 10/22/2024   Last metabolic panel Lab Results  Component Value Date   GLUCOSE 110 (H) 10/22/2024   NA 139 10/22/2024   K 3.8 10/22/2024   CL 102 10/22/2024   CO2 31 10/22/2024   BUN 10 10/22/2024   CREATININE 0.91 10/22/2024   GFR 62.19 10/22/2024   CALCIUM  9.3 10/22/2024   PHOS 6.8 (H) 11/13/2015   PROT 7.3 10/22/2024   ALBUMIN 4.2 10/22/2024   BILITOT 0.7 10/22/2024   ALKPHOS 59 10/22/2024   AST 17 10/22/2024   ALT 12 10/22/2024   ANIONGAP 10 11/13/2015   Last lipids Lab Results  Component Value Date   CHOL 140 10/22/2024   HDL 44.60 10/22/2024   LDLCALC 83 10/22/2024   LDLDIRECT 160.1 07/18/2009   TRIG 66.0 10/22/2024   CHOLHDL 3 10/22/2024   Last hemoglobin A1c Lab Results  Component Value Date   HGBA1C 6.3 10/22/2024   Last thyroid  functions Lab Results  Component Value Date   TSH 1.23 07/18/2023   Last vitamin D  Lab Results  Component Value Date   VD25OH 59.32 07/17/2024   Last vitamin  B12 and Folate Lab Results  Component Value Date   VITAMINB12 491 07/26/2022         Assessment & Plan:   Assessment and Plan Assessment & Plan Essential hypertension Blood pressure well-controlled. - Continue current antihypertensive regimen.  Chronic kidney disease  stage 2 - Ordered labs to monitor kidney function and potassium levels.  Mixed hyperlipidemia - Continue current lipid-lowering therapy. - lipids today  Other abnormal glucose (prediabetes) - Continue current management plan. - CMP, A1c today  Chronic right shoulder pain - refilled cyclobenzaprine   Immunization Due Flu vaccination due. - Ensure flu vaccination is administered at the pharmacy.  Medication Management - labs today, will dose adjust medications as indicated     Orders Placed This Encounter  Procedures   Flu vaccine HIGH DOSE PF(Fluzone Trivalent)   CBC with Differential/Platelet    Release to patient:   Immediate [1]   Comprehensive metabolic panel with GFR    Release to patient:   Immediate [1]   Hemoglobin A1c   Lipid Profile     Meds ordered this encounter  Medications   bisoprolol  (ZEBETA ) 5 MG tablet    Sig: Take one tablet daily for blood pressure.    Dispense:  90 tablet    Refill:  3   cyclobenzaprine  (FLEXERIL ) 10 MG tablet    Sig: Take 0.5-1 tablets (5-10 mg total) by mouth 3 (three) times daily as needed for muscle spasms.    Dispense:  60 tablet    Refill:  1    Return in about 3 months (around 01/22/2025) for meds OV.  Corean LITTIE Ku, FNP

## 2024-10-22 NOTE — Patient Instructions (Addendum)
 We are checking labs today, will be in contact with any results that require further attention.  We have given your flu vaccine today.   Tetanus vaccine at the pharmacy.  Follow up with me in about 3 months for labs and medication management, sooner if needed.

## 2024-10-24 ENCOUNTER — Ambulatory Visit: Payer: Self-pay | Admitting: Family Medicine

## 2024-11-08 ENCOUNTER — Telehealth: Payer: Self-pay

## 2024-11-08 DIAGNOSIS — E782 Mixed hyperlipidemia: Secondary | ICD-10-CM

## 2024-11-08 NOTE — Progress Notes (Signed)
 Pharmacy Quality Measure Review  This patient is appearing on a report for being at risk of failing the adherence measure for cholesterol (statin) medications this calendar year.   Medication: Atorvastatin  40mg  Last fill date: 08/19 for 90 day supply  Attempted to call patient x3, unable to reach patient.  Terriona Horlacher, PharmD [facility] Pharmacist

## 2024-11-26 ENCOUNTER — Telehealth: Payer: Self-pay | Admitting: Pharmacist

## 2024-11-26 NOTE — Progress Notes (Signed)
 Pharmacy Quality Measure Review  This patient is appearing on a report for being at risk of failing the adherence measure for cholesterol (statin) and hypertension (ACEi/ARB) medications this calendar year.   Medication: Losartan /HCTZ Last fill date: 08/06/24 for 90 day supply  Medication: Atorvastatin  Last fill date: 07/17/24 for 90 day supply  Contacted patient regarding medication adherence. Pt looked at her medication bottles while on the phone. She notes she is still taking both medications above, nothing that she has 2 tablets left of Losartan /hydrochlorothiazide  and will call in for a refill. She reports adherence to both with no current concerns. Pt has refills available for losartan /hydrochlorothiazide  and atorvastatin  at the pharmacy.  Lydia Edwards, PharmD, BCPS, CPP Clinical Pharmacist Practitioner [facility] [phone]

## 2025-01-22 ENCOUNTER — Ambulatory Visit: Admitting: Family Medicine

## 2025-02-04 ENCOUNTER — Ambulatory Visit: Admitting: Family Medicine

## 2025-06-21 ENCOUNTER — Ambulatory Visit
